# Patient Record
Sex: Male | Born: 1940 | Race: White | Hispanic: No | Marital: Married | Smoking: Former smoker
Health system: Southern US, Community
[De-identification: ages and names within clinical notes are randomized; demographics above are authoritative.]

## PROBLEM LIST (undated history)

## (undated) DIAGNOSIS — I2510 Atherosclerotic heart disease of native coronary artery without angina pectoris: Secondary | ICD-10-CM

## (undated) DIAGNOSIS — E785 Hyperlipidemia, unspecified: Secondary | ICD-10-CM

## (undated) DIAGNOSIS — L439 Lichen planus, unspecified: Secondary | ICD-10-CM

## (undated) DIAGNOSIS — I10 Essential (primary) hypertension: Secondary | ICD-10-CM

## (undated) DIAGNOSIS — N400 Enlarged prostate without lower urinary tract symptoms: Secondary | ICD-10-CM

## (undated) DIAGNOSIS — R7309 Other abnormal glucose: Secondary | ICD-10-CM

## (undated) HISTORY — PX: TONSILLECTOMY: SHX5217

## (undated) HISTORY — DX: Hyperlipidemia, unspecified: E78.5

## (undated) HISTORY — PX: VASECTOMY: SHX75

## (undated) HISTORY — DX: Essential (primary) hypertension: I10

## (undated) HISTORY — PX: APPENDECTOMY: SHX54

## (undated) HISTORY — DX: Lichen planus, unspecified: L43.9

## (undated) HISTORY — PX: CHOLECYSTECTOMY: SHX55

## (undated) HISTORY — DX: Benign prostatic hyperplasia without lower urinary tract symptoms: N40.0

## (undated) HISTORY — DX: Other abnormal glucose: R73.09

## (undated) HISTORY — DX: Atherosclerotic heart disease of native coronary artery without angina pectoris: I25.10

---
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
## 1997-10-15 HISTORY — PX: REFRACTIVE SURGERY: SHX103

## 1998-01-13 ENCOUNTER — Ambulatory Visit (HOSPITAL_COMMUNITY): Admission: RE | Admit: 1998-01-13 | Discharge: 1998-01-13 | Payer: Self-pay | Admitting: Neurosurgery

## 1998-10-15 HISTORY — PX: CORONARY ANGIOPLASTY WITH STENT PLACEMENT: SHX49

## 1999-06-11 ENCOUNTER — Inpatient Hospital Stay (HOSPITAL_COMMUNITY): Admission: EM | Admit: 1999-06-11 | Discharge: 1999-06-14 | Payer: Self-pay | Admitting: Emergency Medicine

## 1999-06-11 ENCOUNTER — Encounter: Payer: Self-pay | Admitting: Emergency Medicine

## 2000-04-26 ENCOUNTER — Ambulatory Visit (HOSPITAL_COMMUNITY): Admission: RE | Admit: 2000-04-26 | Discharge: 2000-04-27 | Payer: Self-pay | Admitting: *Deleted

## 2002-04-21 ENCOUNTER — Ambulatory Visit (HOSPITAL_COMMUNITY): Admission: RE | Admit: 2002-04-21 | Discharge: 2002-04-21 | Payer: Self-pay | Admitting: Gastroenterology

## 2002-12-24 ENCOUNTER — Encounter: Admission: RE | Admit: 2002-12-24 | Discharge: 2002-12-24 | Payer: Self-pay | Admitting: Internal Medicine

## 2002-12-24 ENCOUNTER — Encounter: Payer: Self-pay | Admitting: Internal Medicine

## 2004-05-12 ENCOUNTER — Inpatient Hospital Stay (HOSPITAL_COMMUNITY): Admission: EM | Admit: 2004-05-12 | Discharge: 2004-05-13 | Payer: Self-pay

## 2004-05-12 ENCOUNTER — Encounter (INDEPENDENT_AMBULATORY_CARE_PROVIDER_SITE_OTHER): Payer: Self-pay | Admitting: Specialist

## 2004-06-22 ENCOUNTER — Encounter: Admission: RE | Admit: 2004-06-22 | Discharge: 2004-06-22 | Payer: Self-pay | Admitting: Surgery

## 2004-08-17 ENCOUNTER — Ambulatory Visit (HOSPITAL_COMMUNITY): Admission: RE | Admit: 2004-08-17 | Discharge: 2004-08-17 | Payer: Self-pay | Admitting: Surgery

## 2004-08-17 ENCOUNTER — Encounter (INDEPENDENT_AMBULATORY_CARE_PROVIDER_SITE_OTHER): Payer: Self-pay | Admitting: *Deleted

## 2005-03-19 ENCOUNTER — Ambulatory Visit: Payer: Self-pay | Admitting: Internal Medicine

## 2005-04-10 ENCOUNTER — Ambulatory Visit: Payer: Self-pay | Admitting: Cardiology

## 2005-05-31 ENCOUNTER — Ambulatory Visit: Payer: Self-pay | Admitting: Internal Medicine

## 2005-06-04 ENCOUNTER — Ambulatory Visit: Payer: Self-pay

## 2005-09-04 ENCOUNTER — Inpatient Hospital Stay (HOSPITAL_COMMUNITY): Admission: RE | Admit: 2005-09-04 | Discharge: 2005-09-06 | Payer: Self-pay | Admitting: Orthopedic Surgery

## 2005-09-04 ENCOUNTER — Ambulatory Visit: Payer: Self-pay | Admitting: Internal Medicine

## 2005-09-10 ENCOUNTER — Ambulatory Visit: Payer: Self-pay | Admitting: Internal Medicine

## 2005-09-10 ENCOUNTER — Observation Stay (HOSPITAL_COMMUNITY): Admission: EM | Admit: 2005-09-10 | Discharge: 2005-09-11 | Payer: Self-pay | Admitting: Emergency Medicine

## 2005-09-13 ENCOUNTER — Ambulatory Visit: Payer: Self-pay | Admitting: Internal Medicine

## 2005-09-18 ENCOUNTER — Ambulatory Visit: Payer: Self-pay | Admitting: Internal Medicine

## 2005-09-21 ENCOUNTER — Ambulatory Visit: Payer: Self-pay | Admitting: Internal Medicine

## 2005-09-26 ENCOUNTER — Ambulatory Visit: Payer: Self-pay | Admitting: Internal Medicine

## 2006-05-01 ENCOUNTER — Ambulatory Visit: Payer: Self-pay | Admitting: Cardiology

## 2006-05-02 ENCOUNTER — Ambulatory Visit: Payer: Self-pay | Admitting: Cardiology

## 2006-05-02 ENCOUNTER — Ambulatory Visit: Payer: Self-pay | Admitting: Internal Medicine

## 2006-05-15 ENCOUNTER — Ambulatory Visit: Payer: Self-pay | Admitting: Cardiology

## 2006-08-07 ENCOUNTER — Ambulatory Visit: Payer: Self-pay

## 2006-12-17 ENCOUNTER — Ambulatory Visit: Payer: Self-pay | Admitting: Internal Medicine

## 2006-12-17 LAB — CONVERTED CEMR LAB
Hgb A1c MFr Bld: 6.2 % — ABNORMAL HIGH (ref 4.6–6.0)
Total CK: 109 units/L (ref 7–195)

## 2007-07-07 ENCOUNTER — Telehealth: Payer: Self-pay | Admitting: Internal Medicine

## 2007-07-09 ENCOUNTER — Encounter: Payer: Self-pay | Admitting: Internal Medicine

## 2007-07-22 ENCOUNTER — Ambulatory Visit: Payer: Self-pay | Admitting: Internal Medicine

## 2007-07-27 LAB — CONVERTED CEMR LAB
Hgb A1c MFr Bld: 5.8 % (ref 4.6–6.0)
LDL Cholesterol: 50 mg/dL (ref 0–99)
Triglycerides: 94 mg/dL (ref 0–149)
VLDL: 19 mg/dL (ref 0–40)

## 2007-07-28 ENCOUNTER — Encounter (INDEPENDENT_AMBULATORY_CARE_PROVIDER_SITE_OTHER): Payer: Self-pay | Admitting: *Deleted

## 2007-07-28 ENCOUNTER — Ambulatory Visit: Payer: Self-pay | Admitting: Internal Medicine

## 2007-07-28 DIAGNOSIS — E785 Hyperlipidemia, unspecified: Secondary | ICD-10-CM | POA: Insufficient documentation

## 2007-07-28 DIAGNOSIS — E1169 Type 2 diabetes mellitus with other specified complication: Secondary | ICD-10-CM | POA: Insufficient documentation

## 2007-07-28 DIAGNOSIS — E782 Mixed hyperlipidemia: Secondary | ICD-10-CM | POA: Insufficient documentation

## 2007-07-28 DIAGNOSIS — N400 Enlarged prostate without lower urinary tract symptoms: Secondary | ICD-10-CM | POA: Insufficient documentation

## 2007-07-28 LAB — CONVERTED CEMR LAB
HDL goal, serum: 40 mg/dL
LDL Goal: 130 mg/dL

## 2007-07-29 LAB — CONVERTED CEMR LAB: PSA: 0.72 ng/mL (ref 0.10–4.00)

## 2007-07-30 ENCOUNTER — Encounter (INDEPENDENT_AMBULATORY_CARE_PROVIDER_SITE_OTHER): Payer: Self-pay | Admitting: *Deleted

## 2007-08-08 ENCOUNTER — Ambulatory Visit: Payer: Self-pay | Admitting: Internal Medicine

## 2007-10-01 ENCOUNTER — Ambulatory Visit: Payer: Self-pay | Admitting: Cardiology

## 2007-10-16 HISTORY — PX: CATARACT EXTRACTION W/ INTRAOCULAR LENS IMPLANT: SHX1309

## 2008-02-23 ENCOUNTER — Ambulatory Visit: Payer: Self-pay | Admitting: Internal Medicine

## 2008-02-23 ENCOUNTER — Telehealth (INDEPENDENT_AMBULATORY_CARE_PROVIDER_SITE_OTHER): Payer: Self-pay | Admitting: *Deleted

## 2008-03-18 ENCOUNTER — Telehealth (INDEPENDENT_AMBULATORY_CARE_PROVIDER_SITE_OTHER): Payer: Self-pay | Admitting: *Deleted

## 2008-04-15 ENCOUNTER — Ambulatory Visit: Payer: Self-pay | Admitting: Cardiology

## 2008-04-19 ENCOUNTER — Encounter: Payer: Self-pay | Admitting: Internal Medicine

## 2008-04-27 ENCOUNTER — Encounter: Payer: Self-pay | Admitting: Internal Medicine

## 2008-04-28 ENCOUNTER — Ambulatory Visit: Payer: Self-pay

## 2008-05-06 ENCOUNTER — Ambulatory Visit (HOSPITAL_COMMUNITY): Admission: RE | Admit: 2008-05-06 | Discharge: 2008-05-07 | Payer: Self-pay | Admitting: Orthopedic Surgery

## 2008-10-06 ENCOUNTER — Ambulatory Visit: Payer: Self-pay | Admitting: Cardiology

## 2008-10-06 LAB — CONVERTED CEMR LAB
HDL: 40.7 mg/dL (ref >39.0)
LDL Cholesterol: 91 mg/dL (ref 0–99)
Triglycerides: 85 mg/dL (ref 0–149)
VLDL: 17 mg/dL (ref 0–40)

## 2008-11-30 ENCOUNTER — Ambulatory Visit: Payer: Self-pay | Admitting: Cardiology

## 2008-11-30 LAB — CONVERTED CEMR LAB
Albumin: 4 g/dL (ref 3.5–5.2)
Alkaline Phosphatase: 51 units/L (ref 39–117)
Cholesterol: 133 mg/dL (ref 0–200)
HDL: 39.3 mg/dL (ref >39.0)
LDL Cholesterol: 81 mg/dL (ref 0–99)
Total Protein: 6.4 g/dL (ref 6.0–8.3)
Triglycerides: 66 mg/dL (ref 0–149)

## 2009-05-31 ENCOUNTER — Ambulatory Visit: Payer: Self-pay | Admitting: Cardiology

## 2009-06-06 ENCOUNTER — Ambulatory Visit: Payer: Self-pay | Admitting: Cardiology

## 2009-06-06 DIAGNOSIS — I2510 Atherosclerotic heart disease of native coronary artery without angina pectoris: Secondary | ICD-10-CM | POA: Insufficient documentation

## 2009-06-21 ENCOUNTER — Encounter (INDEPENDENT_AMBULATORY_CARE_PROVIDER_SITE_OTHER): Payer: Self-pay | Admitting: *Deleted

## 2009-06-21 ENCOUNTER — Telehealth (INDEPENDENT_AMBULATORY_CARE_PROVIDER_SITE_OTHER): Payer: Self-pay | Admitting: *Deleted

## 2009-07-05 ENCOUNTER — Telehealth: Payer: Self-pay | Admitting: Cardiology

## 2009-07-06 LAB — CONVERTED CEMR LAB
AST: 25 units/L (ref 0–37)
Albumin: 4.3 g/dL (ref 3.5–5.2)
Basophils Absolute: 0 10*3/uL (ref 0.0–0.1)
Bilirubin, Direct: 0.1 mg/dL (ref 0.0–0.3)
CO2: 29 meq/L (ref 19–32)
Calcium: 9.2 mg/dL (ref 8.4–10.5)
Cholesterol: 126 mg/dL (ref 0–200)
Creatinine, Ser: 1.2 mg/dL (ref 0.4–1.5)
Eosinophils Absolute: 0.2 10*3/uL (ref 0.0–0.7)
GFR calc non Af Amer: 64.43 mL/min (ref >60)
Glucose, Bld: 114 mg/dL — ABNORMAL HIGH (ref 70–99)
HCT: 47.2 % (ref 39.0–52.0)
HDL: 39.4 mg/dL (ref >39.00)
Lymphocytes Relative: 19.2 % (ref 12.0–46.0)
Monocytes Absolute: 0.7 10*3/uL (ref 0.1–1.0)
Platelets: 163 10*3/uL (ref 150.0–400.0)
Potassium: 4.9 meq/L (ref 3.5–5.1)
RDW: 12.8 % (ref 11.5–14.6)
TSH: 1.67 microintl units/mL (ref 0.35–5.50)
Total Bilirubin: 1.3 mg/dL — ABNORMAL HIGH (ref 0.3–1.2)
Total CHOL/HDL Ratio: 3
Triglycerides: 71 mg/dL (ref 0.0–149.0)
VLDL: 14.2 mg/dL (ref 0.0–40.0)
WBC: 6.9 10*3/uL (ref 4.5–10.5)

## 2009-07-25 ENCOUNTER — Ambulatory Visit: Payer: Self-pay | Admitting: Internal Medicine

## 2009-07-25 DIAGNOSIS — R7309 Other abnormal glucose: Secondary | ICD-10-CM | POA: Insufficient documentation

## 2009-07-28 ENCOUNTER — Encounter (INDEPENDENT_AMBULATORY_CARE_PROVIDER_SITE_OTHER): Payer: Self-pay | Admitting: *Deleted

## 2010-02-17 ENCOUNTER — Ambulatory Visit: Payer: Self-pay | Admitting: Internal Medicine

## 2010-02-17 DIAGNOSIS — J302 Other seasonal allergic rhinitis: Secondary | ICD-10-CM | POA: Insufficient documentation

## 2010-02-27 ENCOUNTER — Telehealth (INDEPENDENT_AMBULATORY_CARE_PROVIDER_SITE_OTHER): Payer: Self-pay | Admitting: *Deleted

## 2010-07-05 ENCOUNTER — Ambulatory Visit: Payer: Self-pay | Admitting: Cardiology

## 2010-07-24 ENCOUNTER — Telehealth: Payer: Self-pay | Admitting: Internal Medicine

## 2010-07-25 ENCOUNTER — Encounter: Payer: Self-pay | Admitting: Internal Medicine

## 2010-07-26 ENCOUNTER — Ambulatory Visit: Payer: Self-pay | Admitting: Internal Medicine

## 2010-07-26 DIAGNOSIS — K121 Other forms of stomatitis: Secondary | ICD-10-CM | POA: Insufficient documentation

## 2010-07-26 DIAGNOSIS — K1230 Oral mucositis (ulcerative), unspecified: Secondary | ICD-10-CM

## 2010-07-27 ENCOUNTER — Encounter: Payer: Self-pay | Admitting: Internal Medicine

## 2010-07-28 ENCOUNTER — Telehealth (INDEPENDENT_AMBULATORY_CARE_PROVIDER_SITE_OTHER): Payer: Self-pay | Admitting: *Deleted

## 2010-07-28 LAB — CONVERTED CEMR LAB
ALT: 37 units/L (ref 0–53)
AST: 33 units/L (ref 0–37)
Albumin: 4.2 g/dL (ref 3.5–5.2)
Alkaline Phosphatase: 55 units/L (ref 39–117)
BUN: 21 mg/dL (ref 6–23)
Basophils Relative: 0.3 % (ref 0.0–3.0)
Bilirubin, Direct: 0.2 mg/dL (ref 0.0–0.3)
Chloride: 102 meq/L (ref 96–112)
Eosinophils Absolute: 0.1 10*3/uL (ref 0.0–0.7)
Eosinophils Relative: 1.4 % (ref 0.0–5.0)
Lymphocytes Relative: 20.9 % (ref 12.0–46.0)
MCHC: 34.6 g/dL (ref 30.0–36.0)
MCV: 97 fL (ref 78.0–100.0)
Monocytes Absolute: 0.1 10*3/uL (ref 0.1–1.0)
Monocytes Relative: 1.3 % — ABNORMAL LOW (ref 3.0–12.0)
Neutrophils Relative %: 76.1 % (ref 43.0–77.0)
Platelets: 172 10*3/uL (ref 150.0–400.0)
RDW: 12.9 % (ref 11.5–14.6)
Sed Rate: 4 mm/hr (ref 0–22)
Sodium: 139 meq/L (ref 135–145)
Total Bilirubin: 0.9 mg/dL (ref 0.3–1.2)
WBC: 7.2 10*3/uL (ref 4.5–10.5)

## 2010-07-31 ENCOUNTER — Encounter: Payer: Self-pay | Admitting: Internal Medicine

## 2010-08-07 ENCOUNTER — Encounter: Payer: Self-pay | Admitting: Internal Medicine

## 2010-08-29 ENCOUNTER — Telehealth (INDEPENDENT_AMBULATORY_CARE_PROVIDER_SITE_OTHER): Payer: Self-pay | Admitting: Radiology

## 2010-08-30 ENCOUNTER — Ambulatory Visit: Payer: Self-pay | Admitting: Cardiovascular Disease

## 2010-08-30 ENCOUNTER — Ambulatory Visit: Payer: Self-pay

## 2010-08-30 ENCOUNTER — Ambulatory Visit (HOSPITAL_COMMUNITY): Admission: RE | Admit: 2010-08-30 | Discharge: 2010-08-30 | Payer: Self-pay | Admitting: Cardiology

## 2010-09-13 ENCOUNTER — Ambulatory Visit: Payer: Self-pay | Admitting: Internal Medicine

## 2010-09-13 DIAGNOSIS — I10 Essential (primary) hypertension: Secondary | ICD-10-CM | POA: Insufficient documentation

## 2010-09-13 DIAGNOSIS — L439 Lichen planus, unspecified: Secondary | ICD-10-CM | POA: Insufficient documentation

## 2010-09-13 DIAGNOSIS — E1159 Type 2 diabetes mellitus with other circulatory complications: Secondary | ICD-10-CM | POA: Insufficient documentation

## 2010-09-14 LAB — CONVERTED CEMR LAB
Cholesterol: 139 mg/dL (ref 0–200)
Hgb A1c MFr Bld: 6.2 % (ref 4.6–6.5)
LDL Cholesterol: 87 mg/dL (ref 0–99)
PSA: 1.29 ng/mL (ref 0.10–4.00)
TSH: 1.34 microintl units/mL (ref 0.35–5.50)
VLDL: 11.4 mg/dL (ref 0.0–40.0)

## 2010-10-19 ENCOUNTER — Ambulatory Visit
Admission: RE | Admit: 2010-10-19 | Discharge: 2010-10-19 | Payer: Self-pay | Source: Home / Self Care | Attending: Internal Medicine | Admitting: Internal Medicine

## 2010-11-16 NOTE — Letter (Signed)
Summary: E Mail from Patient with Concerns  E Mail from Patient with Concerns   Imported By: Lanelle Bal 08/05/2010 09:02:08  _____________________________________________________________________  External Attachment:    Type:   Image     Comment:   External Document

## 2010-11-16 NOTE — Progress Notes (Signed)
Summary: 10-10-mouth infection- pt has appt [id number]  Phone Note Call from Patient   Caller: Patient Summary of Call: pt left VM that he has an infection in his mouth and would like to get dr hopper opinion. Left message to call office to get further details.............Marland KitchenFelecia Deloach CMA  July 24, 2010 12:19 PM   Follow-up for Phone Call        call from pt and he would like Dr.Hopper to give him a call back when he was available. Pt's call back # [phone] this is the pt's call phone number.  Follow-up by: Almeta Monas CMA Duncan Dull),  July 24, 2010 12:29 PM  Additional Follow-up for Phone Call Additional follow up Details #1::        I left message for him to come in @ 4:30 today if he desires. W/O an exam I would not be able to discern etiology of process Additional Follow-up by: Marga Melnick MD,  July 24, 2010 1:05 PM    Additional Follow-up for Phone Call Additional follow up Details #2::    patient unable to come in today appt scheduled [id number] .Marland KitchenOkey Regal [place]  July 24, 2010 2:09 PM

## 2010-11-16 NOTE — Letter (Signed)
Summary: Gwendlyn Deutscher [name]  Gwendlyn Deutscher [name]   Imported By: Lanelle Bal 09/04/2010 10:05:40  _____________________________________________________________________  External Attachment:    Type:   Image     Comment:   External Document

## 2010-11-16 NOTE — Assessment & Plan Note (Signed)
Summary: cpx & lab/cbs   Vital Signs:  Patient profile:   70 year old male Height:      71.5 inches Weight:      256.8 pounds BMI:     35.44 Temp:     98.1 degrees F oral Pulse rate:   60 / minute Resp:     14 per minute BP sitting:   155 / 75  (left arm) Cuff size:   large  Vitals Entered By: Shonna Chock CMA (September 13, 2010 8:29 AM) CC: CPX with fasting labs    Primary Care Provider:  Marga Melnick MD  CC:  CPX with fasting labs .  History of Present Illness: Here for Medicare AWV: 1.Risk factors based on Past M, S, F history:CAD; Dyslipidemia ; HTN ( chart updated) 2.Physical Activities: weights, cardio, machines 3-4X /weekX 60 min 3.Depression/mood:no issues  4.Hearing: decreased hearing on R to whisper @ 6 ft; hearing protection worn when shooting. PMH of myringotomies on R 5.ADL's: no limitations 6.Fall Risk:none   7.Home Safety: no issues 8.Height, weight, &visual acuity:slightly decreased reading @  6 ft OD ; Dr Hazle Quant monitoring cataract 9.Counseling:  POA & Living Will in place 10.Labs ordered based on risk factors: see Orders 11. Referral Coordination: Audiology referral discussed 82. Care Plan:see Instructions 13.  Cognitive Assessment: Orientation X3; memory & recall  intact   ; "WORLD" spelled backwards; mood & affect  normal. Hypertension Follow-Up      The patient denies lightheadedness, urinary frequency, headaches, edema, and fatigue.  The patient denies the following associated symptoms: chest pain, chest pressure, exercise intolerance, dyspnea, palpitations, and syncope.  Compliance with medications (by patient report) has been near 100%.  The patient reports that dietary compliance has been excellent.  The patient reports exercising 3-4X per week.  Stress ECHO negative this month. Adjunctive measures currently used by the patient include modified salt restriction.  BP not checked @ home. Repeat BP 120/75. Hyperlipidemia Follow-Up        The patient  denies muscle aches, GI upset, abdominal pain, flushing, itching, constipation, and diarrhea.  Compliance with medications (by patient report) has been near 100%.  Adjunctive measures currently used by the patient include ASA and  intermittent Co-Q 10.  Preventive Screening-Counseling & Management  Alcohol-Tobacco     Alcohol drinks/day: 1     Smoking Status: quit > 6 months     Year Started: 1962     Year Quit: 1999     Cigars/week: 2-3  Caffeine-Diet-Exercise     Caffeine use/day: 24 oz  Hep-HIV-STD-Contraception     Dental Visit-last 6 months yes     Sun Exposure-Excessive: no  Safety-Violence-Falls     Risk analyst Use: yes     Firearms in the Home: firearms in the home     Firearm Counseling: not indicated; uses recommended firearm safety measures     Smoke Detectors: yes      Blood Transfusions:  no.        Travel History:  [place] 2008.    Current Medications (verified): 1)  Altace 10 Mg  Tabs (Ramipril) .Marland Kitchen.. 1 By Mouth Qd 2)  Crestor 40 Mg Tabs (Rosuvastatin Calcium) .... Take One Tablet By Mouth Daily. 3)  Multivitamins   Tabs (Multiple Vitamin) .Marland Kitchen.. 1 Tab Once Daily 4)  Aspirin 81 Mg Tbec (Aspirin) .... Take One Tablet By Mouth Daily 5)  Metoprolol Succinate 50 Mg Xr24h-Tab (Metoprolol Succinate) .... 1/2 Once Daily 6)  Ibuprofen 200 Mg Tabs (Ibuprofen) .Marland KitchenMarland KitchenMarland Kitchen  3 By Mouth Every Am 7)  Coenzyme Q10 200 Mg Caps (Coenzyme Q10) .Marland Kitchen.. 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 etiology  Social History: Retired Education officer, community  Married Former Smoker: quit 1999 Alcohol use-yes: socially Regular exercise-yes: aerobic as treadmill, recumbent bike X 1 hr 3-4x/ week Smoking Status:  quit > 6 months Caffeine use/day:  24 oz Dental Care w/in 6 mos.:  yes Sun Exposure-Excessive:  no Seat Belt Use:  yes Blood Transfusions:  no  Review of Systems  The patient denies anorexia, fever, weight loss, weight gain, hoarseness, prolonged cough, hemoptysis, melena, hematochezia, severe indigestion/heartburn, hematuria, suspicious skin lesions, unusual weight change, abnormal bleeding, enlarged lymph nodes, and angioedema.         Dr Mayford Knife is treating oral Lichen Planus  Physical Exam  General:  well-nourished;alert,appropriate and cooperative throughout examination Head:  Normocephalic and atraumatic without obvious abnormalities. Eyes:  No corneal or conjunctival inflammation noted. Ptosis OS. Perrla. Funduscopic exam benign, without hemorrhages, exudates or papilledema. Decreased light reflex OD Ears:  External ear exam shows no significant lesions or deformities.  Otoscopic examination reveals clear canals, tympanic membranes are intact bilaterally without bulging, retraction, inflammation or discharge. Hearing is grossly normal bilaterally. Minor scarring Nose:  External nasal examination shows no deformity or inflammation. Nasal mucosa are pink and moist without lesions or exudates. Mouth:  Oral mucosa and oropharynx : mild lichen planus  .  Teeth in good repair. Neck:  No deformities, masses, or tenderness noted. Lungs:  Normal respiratory effort, chest expands symmetrically. Lungs are clear to auscultation, no crackles or wheezes. Heart:  regular rhythm, no murmur, no gallop, no rub, no JVD, no HJR, and bradycardia.   Abdomen:  Bowel sounds positive,abdomen soft and non-tender without masses,  organomegaly or hernias noted. Rectal:  No external abnormalities noted. Normal sphincter tone. No rectal masses or tenderness. Genitalia:  Testes bilaterally descended without nodularity, tenderness or masses. No scrotal masses or lesions. No penis lesions or urethral discharge. Vasectomy scar tissue Prostate:  Prostate gland firm and smooth, no enlargement, nodularity, tenderness, mass, asymmetry or induration. Msk:  No deformity or scoliosis noted of thoracic or lumbar spine.   Pulses:  R and L carotid,radial,dorsalis pedis and posterior tibial pulses are full and equal bilaterally Extremities:  No clubbing, cyanosis, edema. Crepitus of knees with decreased ROM Neurologic:  alert & oriented X3 and DTRs symmetrical and normal.   Skin:  Intact without suspicious lesions or rashes. Congenital nevus R elbow area Cervical Nodes:  No lymphadenopathy noted Axillary Nodes:  No palpable lymphadenopathy Inguinal Nodes:  No significant adenopathy Psych:  memory intact for recent and remote, normally interactive, and good eye contact.     Impression & Recommendations:  Problem # 1:  PREVENTIVE HEALTH CARE (ICD-V70.0)  Orders: Medicare -1st Annual Wellness Visit [phone])  Problem # 2:  HYPERTENSION (ICD-401.9) repeat BP 120/75 His updated medication list for this problem includes:    Altace 10 Mg Tabs (Ramipril) .Marland KitchenMarland KitchenMarland KitchenMarland Kitchen 1  by mouth qd    Metoprolol Succinate 50 Mg Xr24h-tab (Metoprolol succinate) .Marland Kitchen... 1/2 once daily  Problem # 3:  HYPERLIPIDEMIA (ICD-272.2)  His updated medication list for this problem includes:    Crestor 40 Mg Tabs (Rosuvastatin calcium) .Marland Kitchen... Take one tablet by mouth daily.  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 1 by mouth qd    Aspirin 81 Mg Tbec (Aspirin) .Marland Kitchen... Take one tablet by mouth daily    Metoprolol Succinate 50 Mg Xr24h-tab (Metoprolol succinate) .Marland Kitchen... 1/2 once daily  Problem # 7:  FASTING HYPERGLYCEMIA (ICD-790.29)  Orders: Venipuncture (84696) Specimen Handling (29528) TLB-A1C / Hgb A1C (Glycohemoglobin) (83036-A1C)  Complete Medication List: 1)  Altace 10 Mg Tabs (Ramipril) .Marland Kitchen.. 1 by mouth qd 2)  Crestor 40 Mg Tabs (Rosuvastatin calcium) .... Take one tablet by mouth daily. 3)  Multivitamins Tabs (Multiple vitamin) .Marland Kitchen.. 1 tab once daily 4)  Aspirin 81 Mg Tbec (Aspirin) .... Take one tablet by mouth daily 5)  Metoprolol Succinate 50 Mg Xr24h-tab (Metoprolol succinate) .... 1/2 once daily 6)  Ibuprofen 200 Mg Tabs (Ibuprofen) .... 3 by mouth every am 7)  Coenzyme Q10 200 Mg Caps (Coenzyme q10) .Marland Kitchen.. 1 cap once daily  Other Orders: Flu Vaccine 46yrs + MEDICARE PATIENTS (U1324) Administration Flu vaccine - MCR (M0102)  Patient Instructions: 1)  ConsiderAudiology referral. 2)  It is important that you exercise regularly at least 20 minutes 5 times a week. If you develop chest pain, have severe difficulty breathing, or feel very tired , stop exercising immediately and seek medical attention. 3)  Schedule a colonoscopy as per Dr Kinnie Scales. 4)  Take an Aspirin every day with food. 5)  Check your Blood Pressure regularly. If it is above:135/85 ON AVERAGE  you should make an appointment.   Orders Added: 1)  Medicare -1st Annual Wellness Visit [G0438] 2)  Est. Patient Level III [72536] 3)  Venipuncture [64403] 4)  Specimen Handling [99000] 5)  Flu Vaccine 70yrs + MEDICARE PATIENTS [Q2039] 6)  Administration  Flu vaccine - MCR [G0008] 7)  TLB-TSH (Thyroid Stimulating Hormone) [84443-TSH] 8)  TLB-Lipid Panel [80061-LIPID] 9)  TLB-A1C / Hgb A1C (Glycohemoglobin) [83036-A1C] 10)  TLB-PSA (Prostate Specific Antigen) [47425-ZDG]      Flu Vaccine Consent Questions     Do you have a history of severe allergic reactions to this vaccine? no    Any prior history of allergic reactions to egg and/or gelatin? no    Do you have a sensitivity to the preservative Thimersol? no    Do you have a past history of Guillan-Barre Syndrome? no    Do you currently have an acute febrile illness? no    Have you ever had a severe reaction to latex? no    Vaccine information given and explained to patient? yes    Are you currently pregnant? no    Lot Number:[id number]   Exp Date:04/14/2011   Site Given  Right Deltoid IMbmedflu

## 2010-11-16 NOTE — Miscellaneous (Signed)
Summary: Appointment Canceled  Appointment status changed to canceled by [employer] on 08/07/2010 11:34 AM.  Cancellation Comments --------------------- STRESS ECHO/414.01/SEC. HORZ/NO PREC. REA/SAF  Appointment Information ----------------------- Appt Type:  CARDIOLOGY ANCILLARY VISIT      Date:  Wednesday, August 23, 2010      Time:  8:30 AM for 60 min   Urgency:  Routine   Made By:  Hoy Finlay Scheduler  To Visit:  [id number]    Reason:  STRESS ECHO/414.01/SEC. HORZ/NO PREC. REA/SAF  Appt Comments ------------- -- 08/07/10 11:34: (CEMR) CANCELED -- STRESS ECHO/414.01/SEC. HORZ/NO PREC. REA/SAF -- 07/06/10 11:22: (CEMR) BOOKED -- Routine CARDIOLOGY ANCILLARY VISIT at 08/23/2010 8:30 AM for 60 min STRESS ECHO/414.01/SEC. HORZ/NO PREC. REA/SAF -- 07/06/10 11:20:

## 2010-11-16 NOTE — Progress Notes (Signed)
Summary: stress echo pre-preocedure  Phone Note Outgoing Call   Call placed by: Domenic Polite, CNMT,  August 29, 2010 12:09 PM Call placed to: Patient Reason for Call: Confirm/change Appt Summary of Call: Tried to reach patient,answering machine was not accepting anymore calls. Initial call taken by: Domenic Polite, CNMT,  August 29, 2010 12:09 PM

## 2010-11-16 NOTE — Progress Notes (Signed)
Summary: Lab Results  Phone Note Outgoing Call Call back at Home Phone [phone]   Call placed by: Shonna Chock CMA,  July 28, 2010 7:57 AM Call placed to: Patient Summary of Call: Spoke with patient: Complete blood count  is normal(monocyte count of 1.3 % insignificant). Sed rate measures inflammation. Glucose is high but this was non fasting; the Diabetes screening test (A1c) will be checked just to be cautious. Other chemistry tests & liver function are normal. Overall results are excellent.Levester Fresh CMA  July 28, 2010 7:57 AM

## 2010-11-16 NOTE — Assessment & Plan Note (Signed)
Summary: infection in mouth/cbs   Vital Signs:  Patient profile:   70 year old male Weight:      254.2 pounds BMI:     34.89 Temp:     98.5 degrees F oral Pulse rate:   64 / minute Resp:     14 per minute BP sitting:   140 / 78  (left arm) Cuff size:   large  Vitals Entered By: Shonna Chock CMA (July 26, 2010 2:03 PM) CC: Infection, URI symptoms, Rash   Primary Care Provider:  Marga Melnick MD  CC:  Infection, URI symptoms, and Rash.  History of Present Illness: Dr Julious Oka  presents with oral lesions present  for > 1 month.  The patient reports  some sore throat, but denies nasal congestion, purulent nasal discharge, productive cough,  earache, fever, headache, bilateral facial pain, tooth pain, and tender adenopathy.  Dr Dereck Leep Rxed 10 days of Diflucan & Clotrimazole lozenges , but there has been no change in the oral lesions.Mucosal biopsy is pending @ [facility]. The lesions are worse with heat or spicey foods.  The patient denies the following symptoms:  facial  or  tongue swelling, difficulty breathing, abdominal pain, nausea, vomiting, diarrhea, dysuria, eye symptoms, and arthralgias.  The patient denies history of recent infection, recent antibiotic use,  or new medication other than the anti fungal agents Rxed by Dr Manson Passey.    Current Medications (verified): 1)  Altace 10 Mg  Tabs (Ramipril) .Marland Kitchen.. 1 By Mouth Qd 2)  Crestor 40 Mg Tabs (Rosuvastatin Calcium) .... Take One Tablet By Mouth Daily. 3)  Folic Acid 1 Mg Tabs (Folic Acid) .Marland Kitchen.. 1 Tab Once Daily 4)  Multivitamins   Tabs (Multiple Vitamin) .Marland Kitchen.. 1 Tab Once Daily 5)  Aspirin 81 Mg Tbec (Aspirin) .... Take One Tablet By Mouth Daily 6)  Metoprolol Succinate 50 Mg Xr24h-Tab (Metoprolol Succinate) .... 1/2 Once Daily 7)  Ibuprofen 200 Mg Tabs (Ibuprofen) .... 3 By Mouth Every Am 8)  Coenzyme Q10 200 Mg Caps (Coenzyme Q10) .Marland Kitchen.. 1 Cap Once Daily  Allergies (verified): No Known Drug Allergies  Review of  Systems General:  Denies chills, fever, and weight loss. Eyes:  Denies discharge, eye pain, and red eye. Allergy:  Denies hives or rash, itching eyes, and sneezing.  Physical Exam  General:  well-nourished,in no acute distress; alert,appropriate and cooperative throughout examination Eyes:  No corneal or conjunctival inflammation noted. EOMI. Perrla. Funduscopic exam benign, without hemorrhages, exudates or papilledema. Vision grossly normal. Ears:  External ear exam shows no significant lesions or deformities.  Otoscopic examination reveals clear canals, tympanic membranes are intact bilaterally without bulging, retraction, inflammation or discharge. Hearing is grossly normal bilaterally. Nose:  External nasal examination shows no deformity or inflammation. Nasal mucosa are pink and moist without lesions or exudates. Mouth:  Oral mucosa :diffuse punctate  faint erythematous  lesions  with scattered coalescing patches  w/o  exudates. No frank blisters or ulcers. Teeth in good repair. Neck:  No deformities, masses, or tenderness noted.Supple Lungs:  Normal respiratory effort, chest expands symmetrically. Lungs are clear to auscultation, no crackles or wheezes. Heart:  Normal rate and regular rhythm. S1 and S2 normal without gallop, murmur, click, rub or other extra sounds. Abdomen:  Bowel sounds positive,abdomen soft and non-tender without masses, organomegaly or hernias noted. Skin:  Intact without suspicious lesions or rashes Cervical Nodes:  No lymphadenopathy noted Axillary Nodes:  No palpable lymphadenopathy Psych:  Oriented X3 and normally interactive.  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 1 by mouth qd    Aspirin 81 Mg Tbec (Aspirin) .Marland Kitchen... Take one tablet by mouth daily    Metoprolol Succinate 50 Mg Xr24h-tab (Metoprolol succinate) .Marland Kitchen... 1/2 once daily  Problem # 3:  HYPERLIPIDEMIA (ICD-272.2)  His updated medication list for this problem includes:    Crestor 40 Mg Tabs (Rosuvastatin calcium) .Marland Kitchen... Take one tablet by mouth daily.  Complete Medication List: 1)  Altace 10 Mg Tabs (Ramipril) .Marland Kitchen.. 1 by mouth qd 2)  Crestor 40 Mg Tabs (Rosuvastatin calcium) .... Take one tablet by mouth daily. 3)  Folic Acid 1 Mg Tabs (Folic acid) .Marland Kitchen.. 1 tab once daily 4)  Multivitamins Tabs (Multiple vitamin) .Marland Kitchen.. 1 tab once daily 5)  Aspirin 81 Mg Tbec (Aspirin) .... Take one tablet by mouth daily 6)  Metoprolol Succinate 50 Mg Xr24h-tab (Metoprolol succinate) .... 1/2 once daily 7)  Ibuprofen 200 Mg Tabs (Ibuprofen) .... 3 by mouth every am 8)  Coenzyme Q10 200 Mg Caps (Coenzyme q10) .Marland Kitchen.. 1 cap once daily 9)  Doxycycline Hyclate 100 Mg Caps (Doxycycline hyclate) .... Dissolve in 5 cc water ; swish well  & swallow(avoid sun)  Patient Instructions: 1)  Stop Altace (Ramipril); increase Metoprolol to 1 once daily . 2)  Check your Blood Pressure regularly.Your goal = AVERAGE < 135/85.Stop all supplements . Prescriptions: DOXYCYCLINE HYCLATE 100 MG CAPS (DOXYCYCLINE HYCLATE) dissolve in 5 cc water ; swish well  & swallow(avoid sun)  #20 x 0   Entered and Authorized by:   Marga Melnick MD   Signed by:   Marga Melnick MD on 07/26/2010   Method used:   Faxed to ...       [facility] [phone]* (retail)       [address]       [address]       [place], [place]  [address]       Ph: [phone]       Fax: [phone]   RxID:   [id number]

## 2010-11-16 NOTE — Letter (Signed)
Summary: Cancer Screening/[facility] Personalized Risk Profile  Cancer Screening/[facility] Personalized Risk Profile   Imported By: Lanelle Bal 09/18/2010 14:25:01  _____________________________________________________________________  External Attachment:    Type:   Image     Comment:   External Document

## 2010-11-16 NOTE — Assessment & Plan Note (Signed)
Summary: f1y  Medications Added FOLIC ACID 1 MG TABS (FOLIC ACID) 1 tab once daily MULTIVITAMINS   TABS (MULTIPLE VITAMIN) 1 tab once daily COENZYME Q10 200 MG CAPS (COENZYME Q10) 1 cap once daily      Allergies Added: NKDA  Visit Type:  1 yr f/u Primary Provider:  Marga Melnick MD  CC:  no cardiac complaints today.  History of Present Illness: [name] is 70 years old and return for management of CAD. 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 He has done well since that time has had no recent chest pain or palpitations. He does have shortness of breath with exertion which has not changed much over the past year.  Current Medications (verified): 1)  Altace 10 Mg  Tabs (Ramipril) .Marland Kitchen.. 1 By Mouth Qd 2)  Crestor 40 Mg Tabs (Rosuvastatin Calcium) .... Take One Tablet By Mouth Daily. 3)  Folic Acid 1 Mg Tabs (Folic Acid) .Marland Kitchen.. 1 Tab Once Daily 4)  Multivitamins   Tabs (Multiple Vitamin) .Marland Kitchen.. 1 Tab Once Daily 5)  Aspirin 81 Mg Tbec (Aspirin) .... Take One Tablet By Mouth Daily 6)  Metoprolol Succinate 50 Mg Xr24h-Tab (Metoprolol Succinate) .... 1/2 Once Daily 7)  Ibuprofen 200 Mg Tabs (Ibuprofen) .... 3 By Mouth Every Am 8)  Promethazine Vc/codeine 6.25-5-10 Mg/30ml Syrp (Phenyleph-Promethazine-Cod) .Marland Kitchen.. 1 Tsp Q 6 Hrs As Needed 9)  Coenzyme Q10 200 Mg Caps (Coenzyme Q10) .Marland Kitchen.. 1 Cap Once Daily  Allergies (verified): No Known Drug Allergies  Past History:  Past Medical History: Reviewed history from 07/25/2009 and no changes required. 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   Vital Signs:  Patient profile:   70 year old male Height:      71.7 inches Weight:      257.12 pounds BMI:     35.29 Pulse rate:   71 / minute Pulse  rhythm:   irregular BP sitting:   122 / 70  (left arm) Cuff size:   large  Vitals Entered By: [name], CMA (July 05, 2010 2:13 PM)  Physical Exam  Additional Exam:  Gen. Well-nourished, in no distress   Neck: No JVD, thyroid not enlarged, no carotid bruits Lungs: No tachypnea, clear without rales, rhonchi or wheezes Cardiovascular: Rhythm regular, PMI not displaced,  heart sounds  normal, no murmurs or gallops, no peripheral edema, pulses normal in all 4 extremities. Abdomen: BS normal, abdomen soft and non-tender without masses or organomegaly, no hepatosplenomegaly. MS: No deformities, no cyanosis or clubbing   Neuro:  No focal sns   Skin:  no lesions    Impression & Recommendations:  Problem # 1:  CORONARY ATHEROSCLEROSIS NATIVE CORONARY ARTERY (ICD-414.01)  He had an anterior MI in 2003 with a bare-metal stent to the RCA. He does have some dyspnea on exertion and he has not had evaluation of his LV function in some time so will plan further evaluation with any exercise stress echocardiogram. His updated medication list for this problem includes:    Altace 10 Mg Tabs (Ramipril) .Marland Kitchen... 1 by mouth qd    Aspirin 81 Mg Tbec (Aspirin) .Marland Kitchen... Take one tablet by mouth daily    Metoprolol Succinate 50 Mg Xr24h-tab (Metoprolol succinate) .Marland Kitchen... 1/2 once  daily  His updated medication list for this problem includes:    Altace 10 Mg Tabs (Ramipril) .Marland Kitchen... 1 by mouth qd    Aspirin 81 Mg Tbec (Aspirin) .Marland Kitchen... Take one tablet by mouth daily    Metoprolol Succinate 50 Mg Xr24h-tab (Metoprolol succinate) .Marland Kitchen... 1/2 once daily  Orders: EKG w/ Interpretation (93000) Stress Echo (Stress Echo)  Problem # 2:  HYPERLIPIDEMIA (ICD-272.2)  He is scheduled to have a lipid profile with his physical examination with his primary care physician. His updated medication list for this problem includes:    Crestor 40 Mg Tabs (Rosuvastatin calcium) .Marland Kitchen... Take one tablet by mouth daily.  His updated  medication list for this problem includes:    Crestor 40 Mg Tabs (Rosuvastatin calcium) .Marland Kitchen... Take one tablet by mouth daily.  Patient Instructions: 1)  Your physician wants you to follow-up in: 1 year with Dr. Excell Seltzer.  You will receive a reminder letter in the mail two months in advance. If you don't receive a letter, please call our office to schedule the follow-up appointment. 2)  Your physician has requested that you have a stress echocardiogram. For further information please visit [email or web address]  Please follow instruction sheet as given. 3)  Your physician recommends that you continue on your current medications as directed. Please refer to the Current Medication list given to you today.

## 2010-11-16 NOTE — Progress Notes (Signed)
Summary: cough no better  Phone Note Call from Patient Call back at Work Phone [phone]   Caller: Patient Summary of Call: pt was seen 02/20/10, still coughing a lot, clear sputum, no fever --Taking all the meds clarinex, singulair using the inhaler also using mucinex., cough is bothersome  [facility] Initial call taken by: Kandice Hams,  Feb 27, 2010 2:28 PM  Follow-up for Phone Call        Phenergan with codeine 120cc, 1 tsp every 6 hrs as needed . Azithromycin 250 , as per pack Follow-up by: Marga Melnick MD,  Feb 27, 2010 3:19 PM  Additional Follow-up for Phone Call Additional follow up Details #1::        left msg for pt, rx faxed to  [facility] .Kandice Hams  Feb 27, 2010 3:48 PM  Additional Follow-up by: Kandice Hams,  Feb 27, 2010 3:48 PM    New/Updated Medications: PROMETHAZINE VC/CODEINE 6.25-5-10 MG/5ML SYRP (PHENYLEPH-PROMETHAZINE-COD) 1 tsp q 6 hrs as needed AZITHROMYCIN 250 MG TABS (AZITHROMYCIN) as per pack Prescriptions: AZITHROMYCIN 250 MG TABS (AZITHROMYCIN) as per pack  #1 x 0   Entered by:   Kandice Hams   Authorized by:   Marga Melnick MD   Signed by:   Kandice Hams on 02/27/2010   Method used:   Printed then faxed to ...       [facility] [address] [phone]* (retail)       [address]       [place], [place]  [address]       Ph: [phone] or [phone]       Fax: [phone]   RxID:   [id number] PROMETHAZINE VC/CODEINE 6.25-5-10 MG/5ML SYRP (PHENYLEPH-PROMETHAZINE-COD) 1 tsp q 6 hrs as needed  #120cc x 0   Entered by:   Kandice Hams   Authorized by:   Marga Melnick MD   Signed by:   Kandice Hams on 02/27/2010   Method used:   Printed then faxed to ...       [facility] [address] [phone]* (retail)       [address]       [place], [place]  [address]       Ph: [phone] or [phone]       Fax: [phone]   RxID:   [id number]   Appended Document: cough no  better pt called back rx still not at pharmacy. pt states that he uses [facility]. rx verbally called in per phone note 02-27-10

## 2010-11-16 NOTE — Assessment & Plan Note (Signed)
Summary: congested/drainage/cbs   Vital Signs:  Patient profile:   70 year old male Weight:      261 pounds BMI:     35.82 Temp:     98.4 degrees F oral Pulse rate:   72 / minute Resp:     14 per minute BP sitting:   130 / 80  (left arm) Cuff size:   large  Vitals Entered By: Shonna Chock (Feb 17, 2010 1:50 PM) CC: CONGESTION X 1 WEEK Comments REVIEWED MED LIST, PATIENT AGREED DOSE AND INSTRUCTION CORRECT    CC:  CONGESTION X 1 WEEK.  History of Present Illness: Onset 1 week as rhinitis & cough with clear sputum followed by persistant chest congestion.Rx: Mucinex, Benadryl w/o help  Allergies (verified): No Known Drug Allergies  Review of Systems General:  Complains of malaise; denies chills, fever, and sweats. ENT:  Denies ear discharge, earache, nasal congestion, and sinus pressure; No frontal headache , facial pain or purulence. Resp:  Complains of shortness of breath, sputum productive, and wheezing; denies chest pain with inspiration; no purulence. Allergy:  Denies itching eyes and sneezing.  Physical Exam  General:  Well-developed,well-nourished,in no acute distress; alert,appropriate and cooperative throughout examination Ears:  External ear exam shows no significant lesions or deformities.  Otoscopic examination reveals clear canals, tympanic membranes are intact bilaterally without bulging, retraction, inflammation or discharge. Hearing is grossly normal bilaterally. TM scarred Nose:  External nasal examination shows no deformity or inflammation. Nasal mucosa are pink and moist without lesions or exudates. Mouth:  Oral mucosa and oropharynx without lesions or exudates.  Teeth in good repair. minimal  uvual erythema.   Lungs:  Normal respiratory effort, chest expands symmetrically. Lungs ; low grade  crackles &  wheezes. Cervical Nodes:  No lymphadenopathy noted Axillary Nodes:  No palpable lymphadenopathy   Impression & Recommendations:  Problem # 1:  COUGH  (ICD-786.2) RAD suggested  Problem # 2:  RHINITIS (ICD-477.9)  His updated medication list for this problem includes:    Clarinex 5 Mg Tabs (Desloratadine) .Marland Kitchen... 1 once daily  Complete Medication List: 1)  Altace 10 Mg Tabs (Ramipril) .Marland Kitchen.. 1 by mouth qd 2)  Crestor 40 Mg Tabs (Rosuvastatin calcium) .... Take one tablet by mouth daily. 3)  Folic Acid 1gram  4)  Multivitamin  5)  Aspirin 81 Mg Tbec (Aspirin) .... Take one tablet by mouth daily 6)  Metoprolol Succinate 50 Mg Xr24h-tab (Metoprolol succinate) .... 1/2 once daily 7)  Ibuprofen 200 Mg Tabs (Ibuprofen) .... 3 by mouth every am 8)  Singulair 10 Mg Tabs (Montelukast sodium) .Marland Kitchen.. 1 once daily 9)  Dulera 200-5 Mcg/act Aero (Mometasone furo-formoterol fum) .Marland Kitchen.. 1-2 puffs every 12 hrs ; gargle & spit  after use 10)  Clarinex 5 Mg Tabs (Desloratadine) .Marland Kitchen.. 1 once daily  Patient Instructions: 1)  Use samples as directed. Report signs of fever Prescriptions: CLARINEX 5 MG TABS (DESLORATADINE) 1 once daily  #14 x 0   Entered and Authorized by:   Marga Melnick MD   Signed by:   Marga Melnick MD on 02/17/2010   Method used:   Samples Given   RxID:   [id number] DULERA 200-5 MCG/ACT AERO (MOMETASONE FURO-FORMOTEROL FUM) 1-2 puffs every 12 hrs ; gargle & spit  after use  #1 x 1   Entered and Authorized by:   Marga Melnick MD   Signed by:   Marga Melnick MD on 02/17/2010   Method used:   Samples Given   RxID:  [id number] SINGULAIR 10 MG TABS (MONTELUKAST SODIUM) 1 once daily  #30 x 1   Entered and Authorized by:   Marga Melnick MD   Signed by:   Marga Melnick MD on 02/17/2010   Method used:   Samples Given   RxID:   [id number]   Appended Document: congested/drainage/cbs singulair samples not given rx verbally given to pharmacy #30 1

## 2010-11-16 NOTE — Assessment & Plan Note (Signed)
Summary: pneu  vaccine//lch   Nurse Visit  CC: Pneumo vacc./kb   Allergies: No Known Drug Allergies  Immunizations Administered:  Pneumonia Vaccine:    Vaccine Type: Pneumovax    Site: right deltoid    Mfr: Merck    Dose: 0.5 ml    Route: IM    Given by: Lucious Groves CMA    Exp. Date: 02/14/2012    Lot #: [id number]    VIS given: 09/19/09 version given October 19, 2010.  Orders Added: 1)  Pneumococcal Vaccine [90732] 2)  Admin 1st Vaccine [16109]

## 2010-12-27 ENCOUNTER — Telehealth (INDEPENDENT_AMBULATORY_CARE_PROVIDER_SITE_OTHER): Payer: Self-pay | Admitting: *Deleted

## 2011-01-02 NOTE — Progress Notes (Signed)
  Phone Note Refill Request Message from:  Patient on December 27, 2010 5:08 PM  Refills Requested: Medication #1:  METOPROLOL SUCCINATE 50 MG XR24H-TAB 1/2 once daily Initial call taken by: Doristine Devoid CMA,  December 27, 2010 5:08 PM    New/Updated Medications: METOPROLOL TARTRATE 25 MG TABS (METOPROLOL TARTRATE) 1/2 tablet two times a day Prescriptions: METOPROLOL TARTRATE 25 MG TABS (METOPROLOL TARTRATE) 1/2 tablet two times a day  #180 x 0   Entered by:   Doristine Devoid CMA   Authorized by:   Marga Melnick MD   Signed by:   Doristine Devoid CMA on 12/27/2010   Method used:   Electronically to        [facility] [address] [phone]* (retail)       [address]       [address]       [place], [place]  [address]       Ph: [phone]       Fax: [phone]   RxID:   [id number]

## 2011-01-14 ENCOUNTER — Other Ambulatory Visit: Payer: Self-pay | Admitting: Internal Medicine

## 2011-02-27 NOTE — Assessment & Plan Note (Signed)
[facility]                            CARDIOLOGY OFFICE NOTE   NAME:[name], [name]                         MRN:          [id number]  DATE:09/06/2008                            DOB:          03-19-1941    [name] called about whether he should stay on Zetia.  He is on both  Zetia and Crestor.  We do not have any of his cholesterol panels in our  system over the past year because I think they had been in Dr. Frederik Pear  system.  I do not have access to that at the present time.  Nevertheless, I think the best thing to do is stop the Zetia and repeat  his lipid profile in about a month and then make a decision depending on  what his LDL and HDL are will be the best treatment for him.  I talked  him by phone tonight and he is going to stop the Zetia now and will put  in the computer to come back for fasting lipid profile in 1 month from  now.  The date today is September 06, 2008.     Mathew Elvera Lennox Juanda Chance, MD, [facility]  Electronically Signed    BRB/MedQ  DD: 09/06/2008  DT: 09/07/2008  Job #: [id number]

## 2011-02-27 NOTE — Op Note (Signed)
NAMESylar, [name] Mathew                  ACCOUNT NO.:  [id number]   MEDICAL RECORD NO.:  [id number]          PATIENT TYPE:  INP   LOCATION:  0002                         FACILITY[facility]  [facility]   PHYSICIAN:  [name], M.D.  DATE OF BIRTH:  [birth year]   DATE OF PROCEDURE:  05/06/2008  DATE OF DISCHARGE:                               OPERATIVE REPORT   PREOPERATIVE DIAGNOSIS:  Right knee medial compartment osteoarthritis,  status post left total knee replacement.   POSTOPERATIVE DIAGNOSIS:  Right knee medial compartment osteoarthritis,  status post left total knee replacement.   PROCEDURE:  Right unicompartmental partial knee replacement, utilizing a  Biomet Oxford knee system, a size large femur, D medial right tibia, and  a 5 mm insert to match the large femur from the right side.   SURGEON:  [name], M.D.   ASSISTANT:  Mathew Glassman. Mann, PA   ANESTHESIA:  General plus a regional femoral block.   DRAINS:  One.   TOURNIQUET TIME:  50 minutes at 250 mmHg.   COMPLICATIONS:  None.   INDICATIONS FOR PROCEDURE:  [name] is a 70 year old male patient of  mine who is status post left knee replacement for ACL deficient  arthritic knee.  He has presented with progressive worsening of right  knee pain.  Based on his radiograph appearance and isolation of his  symptoms on the medial side of the knee, we have discussed the  possibility of partial knee replacement.  The risks and benefits were  discussed, including the standing risks of infection, DVT, component  failure in addition to the potential need to revise this to a total knee  replacement, with progression of arthritis in the lateral or anterior  compartment.  Consent was obtained for the benefit of pain relief.   PROCEDURE IN DETAIL:  The patient was brought to the operative theater  Once adequate anesthesia and preoperative antibiotics with 2 grams Ancef  administered, the patient was positioned supine and a right thigh  tourniquet was placed.  The Oxford leg positioner was then placed with  the leg flexed and abductedand allowed for 120 degrees of flexion.  Following this preparation the leg was prescrubbed, prepped and draped  in sterile fashion.  The leg was exsanguinated and the tourniquet  elevated.  A small median para midline incision was made, followed by a  median arthrotomy.  The patella was subluxed.  Following initial  synovial debridement and partial medial meniscectomy, I removed the  osteophytes off the distal and anterior medial femoral condyles.  There  were no significant osteophytes on the medial notch.   With this preparation, the extramedullary rod was positioned in the  tibia.  Using reciprocating saw on the lateral aspect of the medial  tibial plateau, first followed by the oscillating saw, I removed a  section a section of bone.  We measured this on the back table to be a  size D.  A D tray was placed, and with this resection I felt that the  feeler gauge felt good with appropriate tension.   The cut  was perpendicular to the to the tibia in both planes.  We  __________ into the __________.   Attention was now directed to the femur.  The femoral canal was opened,  using a starting awl with a drill, followed by placement of  intramedullary rod__________  hand pressure.   I then placed the feeler gauge and knee guide for the posterior cut.  The knee guide was positioned in the center of the medial femoral  condyle, and it was parallel to the intramedullary rod and __________.  Once it was in position I drilled  lug holes.  At this point we placed a  posterior femoral cutting block in place, retractor was placed to  protect the MCL.  An oscillating saw was used to remove the posterior  bone spurs.  At this point the __________ was placed and enough distal  femur removed as necessary.  I then did a trial with this, and removal  of the posterior meniscus; I felt that the 5 feeler  gauge felt best, the  4 was a bit too loose for me.  With this the knee brought through  extension, I was unable to really get the size 1 feeler gauge in place.  For this reason I chose the size 5 spigot.   The 5 spigot was placed, the distal femur milled and __________ removed  as necessary.  I then used __________ removed using a __________ curved  osteotome.  The anterior femur brought to the area of the __________.   At this point trial  reduction was carried out.  I was very happy with  the way that the size 5 feeler gauge felt with the knee at 90 degrees of  flexion, and then at 20 degrees of flexion.  The knee came out to full  extension, with a stable medial collateral ligament complex.   At this point all trial components were removed.  The tibial tray was  then held into position with a Hope retractor.  I then pinned it in  place, then using a reciprocating saw I created a trough for the keel.  I then cleared this out with the appropriate instrument.   At this point we placed the keeled trial size D tibial tray in place  with the trial femur, and the trial size 5; at 90 degrees of flexion and  20 degrees flexion there was a millimeter or two of play of the trial,  indicating a balanced knee with the appropriate polyethylene insert.   At this point all trials were removed, I drilled __________ drill.  I  then removed remaining bone as necessary.  Pulse lavage was used to  irrigate the knee.  All the components were opened after identification.  The cement was mixed.  The components were cemented into position with  single cement technique.  I placed and cementedin the tibia first,  brought the knee to 45 degrees of flexion, applied the feeler gauge and  appliedpressure, removing any remaining extruded cement.  This allows me  to visualize the posterior aspect of the knee.  The final stem was  placed into the knee a minute later, and the knee brought to 45 degrees  of flexion  with pressure applied here.   Once the cement had cured and extruded cement removed, __________ on the  posterior aspect of the knee using the tibial surfaceas a mirrored  image, I did not see any cement on the posterior aspect of the femur  that needed  to be removed.  We re-irrigated the knee following this, and  then placed the final size insert __________.  A medium Hemovac drain  was placed deep.  We reapproximated the extensor mechanism of the knee  in flexion, using a #1  [place] a 2-0 Vicryl and a running 4-0 Monocryl.  The knee was  cleaned, dried and dressed sterilely with Steri-Strips and a bulky  sterile dressing.   The patient was brought to the recovery room in stable condition.  He  tolerated the procedure well.      Mathew Frankel Charlann [name], M.D.  Electronically Signed     MDO/MEDQ  D:  05/06/2008  T:  05/06/2008  Job:  [id number]

## 2011-02-27 NOTE — Assessment & Plan Note (Signed)
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  He had a  diaphragmatic wall infarction in 2000, treated with stenting of the  right coronary artery by Dr. Gerri [name] and had subsequent PCI for in-  stent restenosis.  He has done quite well since that time with no recent  chest pain, shortness breath, or palpitations.   He is here today for preoperative clearance prior to knee surgery.  Dr.  Durene [name] plans to do a partial knee replacement, which is scheduled  in about 2 weeks.   OTHER PAST HISTORY:  Significant for hypertension, hyperlipidemia, and  degenerative joint disease.  He has had previous total knee replacement  on the left.   CURRENT MEDICATIONS:  1. Altace 10 mg daily.  2. Toprol 50 mg daily.  3. Ibuprofen.  4. Multivitamins.  5. Fish oil.  6. Crestor 20 mg daily.  7. Zetia 10 mg daily.  8. Aspirin 81 mg daily.   He indicated he had lipid profile by Dr. Alwyn [name] recently, it was good  except for persistently low HDL.   PHYSICAL EXAMINATION:  VITAL SIGNS:  Blood pressure was 164/94 and the  pulse 68 and regular (his blood pressure was normal in Dr. Frederik [name]  office according to his account).  NECK:  There was no venous distension.  The carotid pulses were full  without bruits.  CHEST:  Clear.  HEART:  Rhythm is regular.  No murmurs or gallops.  ABDOMEN:  Soft without organomegaly.  Peripheral pulses are full with no  peripheral edema.   An electrocardiogram showed an old diaphragmatic wall infarction with  left axis deviation.   IMPRESSION:  1. Coronary artery disease, status post diaphragmatic wall infarction.  2. Stenting of the right  coronary artery in 2000 with repeat PCI for      restenosis in 2001, now stable.  3. Good left ventricular function.  4. Hypertension.  5. Hyperlipidemia.   RECOMMENDATIONS:  I think [name] is doing well from a standpoint of the  heart.  He still needs to lose weight and I talked with him about that  and perhaps after he gets his knee fixed, he will be able to be more  active.  We will plan to get adenosine rest stress Myoview scan next  week for preop evaluation.  This is okay.  I think he is okay to proceed  with surgery.  I will plan to see him back in followup in a year.     Bruce Elvera Lennox Juanda Chance, MD, [facility]  Electronically Signed    BRB/MedQ  DD: 04/15/2008  DT: 04/16/2008  Job #: [id number]

## 2011-02-27 NOTE — H&P (Signed)
NAMEJeron, [name] [name]                  ACCOUNT NO.:  [id number]   MEDICAL RECORD NO.:  [id number]         PATIENT TYPE:  LINP   LOCATION:                               FACILITY:  [facility]   PHYSICIAN:  Madlyn Frankel. Charlann Boxer, M.D.  DATE OF BIRTH:  02/01/1941   DATE OF ADMISSION:  05/06/2008  DATE OF DISCHARGE:                              HISTORY & PHYSICAL   PROCEDURE:  Will be a right unicondylar knee replacement.   CHIEF COMPLAINTS:  Right medial knee pain.   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  It has been refractory to all conservative treatment  including oral anti-inflammatories and cortisone injection.  He has been  presurgically cleared for this right partial knee replacement.   PAST MEDICAL HISTORY INCLUDES:  1. Osteoarthritis.  2. Coronary artery disease with stenting in the past.  3. Hypercholesteremia.   PAST SURGICAL HISTORY:  1. Left total knee replacement in 2006.  2. Appendectomy and gallstones in 2007.   FAMILY HISTORY:  Heart disease.   SOCIAL HISTORY:  He is married, a retired Education officer, community.  Primary caregiver  will be his wife in the home.   DRUG ALLERGIES:  Known drug allergies.   CURRENT MEDICATIONS:  1. Aspirin 325 mg p.o. daily.  2. Altace 10 mg p.o. daily.  3. Crestor 20 mg p.o. daily.  4. Folic acid 800 mg p.o. daily.  5. Toprol 50 mg p.o. daily.  6. Zetia 10 mg p.o. daily.   REVIEW OF SYSTEMS:  None other than in HPI.   PHYSICAL EXAMINATION:  VITAL SIGNS:  A 6 foot, 245-pound male.  Pulse  72, respirations 16, blood pressure 144/88.  GENERAL:  Awake, alert and oriented, well-developed, well-nourished.  NECK:  Supple. No carotid bruits.  CHEST:  Lungs are clear to auscultation bilaterally.  BREASTS:  Deferred.  HEART:  Regular rate and rhythm.  S1-S2 distinct.  ABDOMEN:  Soft, nontender, bowel sounds present.  GENITOURINARY:   Deferred.  EXTREMITIES:  Right medial knee pain.  Full extension.  SKIN:  No cellulitis.  NEUROLOGIC:  Intact distal sensibilities.   LABORATORY DATA:  Labs, EKG, chest x-ray, and pending presurgical  testing.   IMPRESSION:  Right medial knee osteoarthritis.   PLAN OF ACTION:  Right unicondylar knee replacement at [facility] May 06, 2008, by surgeon Dr. Durene Romans.  Risks and  complications were discussed.   Postoperative medications were provided at the time of history of  physical including Lovenox Robaxin iron, aspirin, Colace, and MiraLax.  Celebrex will be used perioperatively as well 200 mg twice per day for 2  weeks after surgery.  Pain medicine will be prescribed at the time of  surgery.     ______________________________  Yetta Glassman [address], [place]      Madlyn Frankel. Charlann Boxer, M.D.  Electronically Signed    BLM/MEDQ  D:  05/05/2008  T:  05/05/2008  Job:  [id number]   cc:   Titus Dubin. Alwyn Ren, MD,FACP,FCCP  [address] [place]  [place] [address]   Everardo Beals. Juanda Chance, MD, [facility]  [address]  [place], [place] [address]

## 2011-02-27 NOTE — Assessment & Plan Note (Signed)
[facility]                            CARDIOLOGY OFFICE NOTE   NAME:[name], [name]                         MRN:          [id number]  DATE:10/01/2007                            DOB:          1940/11/10    PRIMARY CARE PHYSICIAN:  Titus Dubin. Alwyn Ren, MD,FACP,FCCP.   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  In 2000, he had a diaphragmatic wall infarction, treated with stenting  of the right coronary artery by Dr. Gerri Spore and a year later, had PCI  for in-stent restenosis.  He has done quite well since that time.  He  has had no chest pain, shortness of breath or palpitations.   PAST MEDICAL HISTORY:  Significant for  1. Hypertension.  2. Hyperlipidemia.  3. Degenerative joint disease, status post total knee replacement.   MEDICATIONS:  Altace, Toprol, aspirin, ibuprofen, fish oil, Crestor and  Zetia.   PHYSICAL EXAMINATION:  VITAL SIGNS:  Blood pressure 140/85, pulse 73 and  regular.  NECK:  There was no venous distension.  The carotid pulses were full  without bruits.  CHEST:  Clear.  CARDIOVASCULAR:  Regular rhythm.  I could hear no murmurs or gallops.  ABDOMEN:  Soft without organomegaly.  EXTREMITIES:  Peripheral pulses were full.  No peripheral edema.   Electrocardiogram showed an old diaphragmatic wall infarction that had  not changed.   IMPRESSION:  1. Coronary artery disease status post diaphragmatic wall infarction      treated with stenting of the right coronary artery in 2000 with      repeat percutaneous coronary intervention for restenosis in 2001,      now stable.  2. Good left ventricular function.  3. Hypertension.  4. Hyperlipidemia.   [name]'s recent lipid profile at Dr. Frederik Pear office showed a cholesterol  of 106 and HDL of 37, LDL 50 and triglycerides 94.   RECOMMENDATIONS:  I think [name] is doing quite well.  His HDL is still  not at target and his blood pressure is slightly elevated as  well.  He  is to work on his weight and diet and exercise to  see if he can improve on both of these.  We will plan to see him back in  follow-up in a year.  I told him to take a baby-sized aspirin.     Bruce Elvera Lennox Juanda Chance, MD, [facility]  Electronically Signed    BRB/MedQ  DD: 10/01/2007  DT: 10/01/2007  Job #: [id number]

## 2011-03-02 NOTE — Cardiovascular Report (Signed)
[facility]  Patient:    [name], [name]                         MRN: [id number] Proc. Date: 04/26/00 Adm. Date:  [id number] Attending:  Daisey Must CC:         Titus Dubin. Alwyn Ren, M.D. [facility]             Daisey Must, M.D. [facility]             Cardiac Catheterization Laboratory                        Cardiac Catheterization  PROCEDURE PERFORMED: 1. Percutaneous transluminal coronary angioplasty utilizing the Cutting    Balloon of the distal right coronary artery and ostial posterior descending    artery for in-stent re-stenosis. 2. Percutaneous transluminal coronary angioplasty of the distal right coronary    artery and posterolateral artery through the stent side struts.  INDICATIONS:  [name] is a 70 year old male, who experienced an acute inferior wall myocardial infarction in August of last year.  He was treated with stent placement x 3 in the right coronary artery.  He has been doing well without symptoms.  However, a stress Cardiolite showed a moderate amount of inferior ischemia.  We therefore proceeded with cardiac catheterization earlier today which revealed the presence of a 95% in-stent re-stenosis in the distal right coronary artery and the stent which extended into the proximal portion of the posterior descending artery.  Because of an emergency procedure, the patient was removed from the table after diagnostic catheterization.  He was then brought back to the laboratory for planned intervention.  DESCRIPTION OF PROCEDURE:  The preexisting sheath in the right femoral artery was exchanged over a wire for a 7 French sheath.  Heparin and Integrilin were administered per protocol.  We used a 7 [place] guiding catheter with side holes.  A BMW wire was advanced under fluoroscopic guidance and beyond the lesion into the distal aspect of the posterior descending artery.  We also advanced a Hi-Torque Floppy wire beyond the lesion through the  side struts of the stent into the posterolateral artery rising from the distal right coronary artery.  We then used 3.5 x 15 mm PowerSail balloon and positioned this across the lesion inflating it to 6 atmospheres.  We then placed a 3.5 x 15 mm Cutting Balloon across the lesion and inflated it to 6 atmospheres for three inflations.  We then went back with a 3.5 x 15 mm PowerSail balloon and positioned this again within the stent inflating it to 14 and then to 16 atmospheres.  We then advanced a 2.5 x 15 mm CrossSail balloon over the floppy wire and through the side struts into the stent in the posterolateral arterial system.  This balloon was inflated to 8 atmospheres.  At that point there was significant recoil within the dilated portion of the stent.  We therefore went back with a 3.5 x 15 mm Cutting Balloon and positioned this across the area of stenosis and inflated it to 8, followed by 10, followed by a second inflation for 10 atmospheres.  Finally we went back with a 2.5 x 15 mm CrossSail balloon through the side struts to the stent, inflated this to 8 and then 10 atmospheres.  Final angiographic images revealed patency of the distal right coronary with 25% residual stenosis and TIMI-3 flow into the  distal vessel. Patency of the posterolateral arterial system was maintained.  COMPLICATIONS:  None.  RESULTS:  Successful complex PTCA utilizing the Cutting Balloon of in-stent re-stenosis in the distal right coronary artery and ostial and proximal posterior descending artery.  A 95% stenosis was reduced to 25% residual with TIMI-3 flow.  Patency of the posterolateral arterial system was maintained as described above.  PLAN:  Integrilin will be continued for 18 hours.  Plavix will be administered for four weeks.  If the patient has recurrent in-stent re-stenosis would consider the use of brachytherapy. DD:  04/26/00 TD:  04/27/00 Job: [id number]

## 2011-03-02 NOTE — Discharge Summary (Signed)
NAMEJaber, [name]                  ACCOUNT NO.:  [id number]   MEDICAL RECORD NO.:  [id number]          PATIENT TYPE:  INP   LOCATION:  1514                         FACILITY:  [facility]   PHYSICIAN:  Madlyn Frankel. Charlann Boxer, M.D.  DATE OF BIRTH:  11-Oct-1941   DATE OF ADMISSION:  09/04/2005  DATE OF DISCHARGE:  09/06/2005                                 DISCHARGE SUMMARY   ADMISSION DIAGNOSES:  1.  Osteoarthritis of the left knee.  2.  Hypertension.  3.  Coronary artery disease.   DISCHARGE DIAGNOSES:  1.  Osteoarthritis of the left knee.  2.  Hypertension.  3.  Coronary artery disease.  4.  Mild postoperative anemia.   OPERATION:  On September 04, 2005 the patient underwent computer-assisted  left total knee replacement arthroplasty utilizing the DePuy rotating  platform.  All three compartments were cemented.  [name], P.A.-C.  assisted.   CONSULTS:  None.   BRIEF HISTORY:  This 71 year old gentleman has had two-three years of  progressive left knee pain, which now in interfering with his day-to-day  activity.  He has stiffness and range of motion of the knee.  Conservative  measures with anti-inflammatory medications, as well as injections, have not  really helped him.  After much discussion, including the risks and benefits  of surgery, he decided he would benefit with surgical intervention and was  admitted for the above procedure.   COURSE IN THE HOSPITAL:  The patient tolerated the surgical procedure quite  well and entered eagerly into therapy.  The wound remained dry.  Neurovascularly he remained intact with his operative extremity.  He was  having no pain or discomfort postoperatively, other than expected.  Medications p.o. were controlling his discomfort.  He had no respiratory  problems.   He was placed on Coumadin protocol postoperatively for prevention of DVT.  The wound remained dry throughout his hospitalization.  Since he was doing  so well and home health was  arranged with [name], it was felt he could be  maintained in his home environment and arrangements were made for discharge.   LABORATORY VALUES:  In the hospital hematologically showed a preoperative  CBC completely within normal limits.  Hemoglobin was 15.9, hematocrit was  45.7.  The final hemoglobin was 11.8, hematocrit was 34.5.  Blood  chemistries were normal other than a very slight drop in sodium  at 134.  Urinalysis was negative for a urinary tract infection.  A chest x-  ray showed chronic bronchitic changes without acute abnormality.  No  electrocardiogram is seen on this chart.   CONDITION ON DISCHARGE:  Improved and stable.   PLAN:  The patient is discharged to his home.  He is given Vicodin for  discomfort, Coumadin protocol/pharmacy, Robaxin as a muscle relaxant, and  Toradol 10 mg one t.i.d. x3 days.  Weightbearing as tolerated.  Use dressing  p.r.n.  He may use his home outpatient at [facility] for  his outpatient therapy and if that is the case, then we will not have the  pharmacy do the protocol for the  Coumadin, but would use Dr. Frederik [name] office  for pro times, INR, and Coumadin dosing.  The patient will contact Dr.  Alwyn [name] for that.  He is encouraged to call if he has any problems.      [name].      Madlyn Frankel Charlann Boxer, M.D.  Electronically Signed    DLU/MEDQ  D:  09/12/2005  T:  09/13/2005  Job:  [id number]   cc:   [name]. [name], M.D. [facility]  [id number] [place]  [place] [address]
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
## 2011-03-02 NOTE — Op Note (Signed)
NAMEVincente, Asbridge Heston                  ACCOUNT NO.:  [id number]   MEDICAL RECORD NO.:  [id number]          PATIENT TYPE:  INP   LOCATION:  X002                         FACILITY:  [facility]   PHYSICIAN:  Madlyn Frankel. Charlann Boxer, M.D.  DATE OF BIRTH:  1941-09-18   DATE OF PROCEDURE:  09/04/2005  DATE OF DISCHARGE:                                 OPERATIVE REPORT   PREOPERATIVE DIAGNOSES:  End-stage left knee osteoarthritis.   POSTOPERATIVE DIAGNOSES:  End-stage left knee osteoarthritis.   PROCEDURE:  Computer assisted left total knee replacement.   COMPONENTS USED:  DePuy rotating platform posterior stabilized high flex  knee with a size 5 femur, size 5 tibia, a size 12.5 mm posterior stabilized  RP polyethylene liner and a 41 patella button.   SURGEON:  Madlyn Frankel. Charlann Boxer, M.D.   ASSISTANT:  Cherly Beach, P.A.-C.   ANESTHESIA:  General plus a femoral block.   DRAINS:  Drains x1.   COMPLICATIONS:  None.   TOURNIQUET TIME:  110 minutes at 250 mmHg.   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 His pain is worse with activities and prolonged  standing and some stiffness. After several years of conservative measures of  antiinflammatory injections, he wished to not continue with conservative  measures and proceed with surgical intervention. The risks and benefits were  discussed including the use of computer.   Consent was obtained based off the discussion.   DESCRIPTION OF PROCEDURE:  The patient was brought to the operative theater.  Once adequate anesthesia and preoperative antibiotics, 2 grams of Ancef,  were administered, the patient was positioned supinely with a proximal thigh  tourniquet placed. The left lower extremity was then prepped and draped in a  sterile fashion following a prescrub with Betadine. The 4 mm Schanz pins  were then placed into the tibia and  femur. The left lower extremity was then  exsanguinated. The tourniquet was elevated.   A midline incision was made and knee exposure made in the routine fashion.  This concluded the proximal medial peel based on the flexion varus deformity  noted. Once the knee was exposed, the computer setup was carried out  including obtaining the center fit for rotation following all the tibial and  femoral key points. Once this was done, it indicated the patient had a 5  degree flexion deformity and an 8 degree varus deformity.   Once this was identified, attention was directed to the femur. Based on the  flexion deformity, I resected 11 mm of bone off the distal femur. This was  in neutral position based on the position of the hip center to the center of  the ankle. Following this cut and then determining its accuracy, attention  was directed to sizing the femur. I did size the femur first with the  regular sizer noted to be a size 5. This was confirmed by the measurements  on the computer. I then positioned the anterior cutting block and  the pins.  This was positioned anatomically. The anterior, anterior chamfer, and  posterior chamfer cuts were then made. Based on the fact, I was using the  rotating platform high flexion design, I then performed the box cut and the  extra 4 mm cut necessary off the posterior femur. At this point, attention  was directed to the tibia. Tibial exposure was obtained in routine fashion  including meniscectomies. Using the cutting block, I initially pinned it to  take 10 mm of bone off the lateral tibia. After making this cut, the  extensor block would not fit so for that reason we resected an additional 2  mm. The cut on the tibia was confirmed anatomically by the computer. Once  this was confirmed, rotation was marked.   With the trial components in position, the knee was noted with a 10 mm  polyethylene liner to come out to hyperextend about 4 degrees. Based on   this, I decided to use a 12 mm poly.   With the trial components in position, attention was directed to the  patella. The precut patella measurement was 26 1/2 mm. I resected down to 15  mm and placed a 41 patella button with 11 mm thickness.   The patella tracked without any application of pressure.   Following this trial reduction, trial __________ removed, final tibial  preparation was carried out using a size 5 tibia with the rotating platform  system. Final components were brought on the field. The wound was copiously  irrigated with normal saline solution. The knee was then injected with a 0.5  mL of 1:1000 epinephrine, 60 mL of 0.5% Marcaine and 1 mL of 30 mg Toradol.  Once the knee was dry, the cement prepared, components were cemented into  position. The tibia and femur with a 10 mm spacer brought into extension as  well as the patella. Once the cement had dried, excessive cement was  removed. A 12.5 poly was then positioned. Excessive cement removed. The knee  was noted at that point to come out to full extension and was in half degree  to 1 degree of varus. It had equal flexion and extension gaps. Given all  these parameters, final components were removed. Excessive cement removed  from the backside of the knee, the knee irrigated and the final 12.5  posterior stabilized rotating platform poly placed. At this point following  repeat irrigation, the tourniquet was let down. The medium hemovac drain was  placed. Extensor mechanism was reapproximated using #1 PDS in flexion. The  remainder of the wound was closed in layers with 4-0 Monocryl. The wound was  cleaned, dried and dressed sterilely with Steri-Strips, dressing, sponge and  tape. The patient was extubated and brought to the recovery room in stable  condition.      Madlyn Frankel Charlann Boxer, M.D.  Electronically Signed     MDO/MEDQ  D:  09/04/2005  T:  09/04/2005  Job:  [id number]

## 2011-03-02 NOTE — Assessment & Plan Note (Signed)
[name]                              CARDIOLOGY OFFICE NOTE   NAME:[name], [name]                         MRN:          [id number]  DATE:05/01/2006                            DOB:          1941/02/08    PRIMARY CARE PHYSICIAN:  Titus Dubin. 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 Gerri Spore.  He subsequently had repeat angioplasty  for in-stent restenosis in 2001.  He has done quite well since that time,  has had no recent chest pain, shortness of breath or palpitations.  We did a  Cardiolite scan last summer prior to a total knee replacement, and he had  inferior scar with minimal periscar redistribution.   PAST MEDICAL HISTORY:  Significant for hypertension and hyperlipidemia.   CURRENT MEDICATIONS:  1.  Altace.  2.  Toprol.  3.  Multivitamins.  4.  Aspirin.  5.  Ibuprofen.  6.  Vytorin.  7.  Folic acid.  8.  Fish oil.   PHYSICAL EXAMINATION:  The blood pressure was 152/90, and the pulse 62 and  regular.  There was no venous distention.  The carotid pulses were full  without bruits.  CHEST:  Clear.  CARDIAC:  Rhythm was regular.  I hear no murmurs or gallops.  ABDOMEN:  Soft without organomegaly.  Peripheral pulses are full.  There is no peripheral edema.   An ECG showed an old inferolateral infarction.   IMPRESSION:  1.  Coronary artery disease, status post diaphragmatic wall infarction,      treated with stenting in the right coronary artery in 2000, with repeat      percutaneous coronary intervention in 2001, now stable.  2.  Good left ventricular function.  3.  Hypertension.  4.  Hyperlipidemia.   RECOMMENDATIONS:  I think [name] is doing well.  I think the main issues are  related to secondary risk factor modification.  His weight is up about 12  pounds from his lightest weight  over the past number of years, and I  encouraged him to lose weight.  He had previously been on the [employer] and I encouraged him to get back on board with that.  His blood  pressure is also up, and I think losing weight, watching the salt in his  diet would be helpful.  We will repeat that when he comes in for laboratory  tests.  We will have him come back for a fasting lipid and liver, CBC and  BNP.  I will see him back in followup in a year.                               Bruce Elvera Lennox Juanda Chance, MD, [facility]    BRB/MedQ  DD:  05/01/2006  DT:  05/02/2006  Job #:  [id number]

## 2011-03-02 NOTE — Op Note (Signed)
NAMEESAUL, DORWART                            ACCOUNT NO.:  [id number]   MEDICAL RECORD NO.:  [id number]                   PATIENT TYPE:  INP   LOCATION:  1827                                 FACILITY:  [facility]   PHYSICIAN:  Sandria Bales. Ezzard Standing, M.D.               DATE OF BIRTH:  11/25/1940   DATE OF PROCEDURE:  05/12/2004  DATE OF DISCHARGE:                                 OPERATIVE REPORT   PREOPERATIVE DIAGNOSIS:  Acute appendicitis.   POSTOPERATIVE DIAGNOSIS:  Acute appendicitis.   PROCEDURE:  Laparoscopic appendectomy.   SURGEON:  Sandria Bales. Ezzard Standing, M.D.   ASSISTANT:  None.   ANESTHESIA:  General endotracheal with about 15 mL of local anesthetic.   COMPLICATIONS:  None.   INDICATIONS FOR PROCEDURE:  Mr. Coles is a 70 year old white male who  presented with signs and symptoms and CT scan evidence of acute  appendicitis.  I discussed with him and his wife about proceeding with  laparoscopic appendectomy.  We discussed the indications and potential  complications which included but not limited to bleeding, infection, bowel  injury, and the need for open surgery.  The patient also has an identified  gallstones but we will not do any kind of gallbladder surgery today.  He  also has a questionable mass in the posterior stomach and we will address  this at a later date.   DESCRIPTION OF PROCEDURE:  The patient was placed in a supine position and a  Foley catheter was placed.  His abdomen was shaved, prepped with Betadine  solution and sterilely draped.  He had a given general endotracheal  anesthetic supervised by Dr. Sharee Holster.  An infraumbilical incision was  made with sharp dissection carried down to the abdominal cavity.  A 12 mm  Hasson trocar was inserted into the abdominal cavity and secured with a 0  Vicryl suture.  A 0 degree 10 mm laparoscope was inserted into the abdomen  and the abdomen explored.  The right and left lobes of the liver were  unremarkable.  The  gallbladder had no evidence of any inflammation around  it, it had a fairly normal appearance externally.  The anterior wall of the  stomach was mildly distended but there was no mass nodularity.  The  remainder of his bowel was covered, pretty much, with omentum.  Two  additional trocars were placed, a 5 mm applied trocar in the right subcostal  location and a 10 mm-11 mm trocar in the left lower quadrant.  The appendix  was identified lateral kind of stuck along the gutter lateral to the colon.  It was grasped and mobilized.  The Harmonic scalpel was used to divide the  mesentery.  I used the blue load of the endo-GIA stapler size 30 to staple  across the base of the appendix.  The appendix was then placed in an  endocatch bag and delivered through  the umbilicus.  I then irrigated the  abdomen with 500 liters of saline.  There was no bleeding.  There was no  evidence of bowel injury.  There was some purulent contamination around the  appendix but this had been pretty much cleaned up with the irrigation  section.  The umbilical port was closed with a 0 Vicryl suture.  The skin at  each site had been  infiltrated with 0.25% Marcaine using about 15 mL.  The skin was closed with  a 5-0 Vicryl suture and painted with tincture of Benzoin.  The patient  tolerated the procedure well, was transported to the recovery room in good  condition.  Sponge and needle counts were correct at the end of the case.                                               Sandria Bales. Ezzard Standing, M.D.    DHN/MEDQ  D:  05/12/2004  T:  05/12/2004  Job:  [id number]   cc:   Titus Dubin. Alwyn Ren, M.D. [facility]   Carole Binning, M.D. [facility]

## 2011-03-02 NOTE — Consult Note (Signed)
[name], [name]                            ACCOUNT NO.:  [id number]   MEDICAL RECORD NO.:  [id number]                   PATIENT TYPE:  INP   LOCATION:  1827                                 FACILITY:  [facility]   PHYSICIAN:  Sandria Bales. Ezzard Standing, M.D.               DATE OF BIRTH:  [birth year]   DATE OF CONSULTATION:  05/12/2004  DATE OF DISCHARGE:                                   CONSULTATION   HISTORY OF PRESENT ILLNESS:  This is a 70 year old white male who has been  in otherwise normal health.  He has been a little bit heavy and has been on  the [facility] Diet trying to lose some weight and maybe lost 10 or 15  pounds over the last several months.  Anyway he had some __________ last  night, May 11, 2004, started feeling abdominal pain which started in his  left upper quadrant at 10 p.m. but his pain then radiated to his right  abdomen.  He vomited once.  He presented to the [facility] Emergency Room  where he was seen and evaluated with a CT scan which shows appendicitis with  a retrocecal appendix on the right side.  He was also noted to have a  gallstone on the CT scan.  There was also questionable posterior filling  defect in his stomach but this may just be all food.   PAST GI HISTORY:  He has no history of peptic ulcer disease, liver disease,  pancreatic disease.  He did have a negative colonoscopy by Dr. Griffith [name], M.D., within the last year or two and Dr. Kinnie Scales suggested follow-up  in 10 years.   ALLERGIES:  No known drug allergies.   CURRENT MEDICATIONS:  1. Toprol 50 mg daily.  2. Altace 10 mg daily.  3. Lipitor 80 mg daily.  4. Aspirin 325 mg daily.  5. Zetia one tablet daily.   REVIEW OF SYMPTOMS:  NEUROLOGIC:  No history of seizure of loss of  consciousness.  PULMONARY:  He has had pneumonia at least twice and treated  as an outpatient but has not smoked cigarettes and has no asthma or other  chronic lung conditions.  CARDIAC:  He had a heart attack in  August of 2000,  has undergone cardiac stents and followed by Dr. Emilie Rutter. Pulsipher.  Apparently underwent a coronary angiography in 2001 which was good.  He saw  Dr. Gerri Spore this past spring in May and apparently was stable at that  time.  He has  had no cardiac symptoms since really his heart attack and  stenting in 2000.  He does have hypertension and has hypercholesterolemia  for which he is being treated.  GASTROINTESTINAL:  See history of present  illness.  UROLOGIC:  No history of kidney stones or kidney infections.   SOCIAL HISTORY:  He is a retired Education officer, community.  His wife is  in the emergency room  with him.   PHYSICAL EXAMINATION:  GENERAL APPEARANCE:  He is a well-nourished, tan,  mildly obese white male.  VITAL SIGNS:  His temperature is 97, blood pressure 140/74, pulse 77,  respiratory rate 18.  HEENT:  Unremarkable.  NECK:  Supple.  I feel no mass, no thyromegaly.  No lymphadenopathy.  He has  supraclavicular or axillary adenopathy.  LUNGS:  Clear to auscultation with symmetric breath sounds.  CARDIOVASCULAR:  Regular rate and rhythm. I hear no murmur or rub.  ABDOMEN:  His abdomen is soft.  He does have some mild to moderate  tenderness on his right side and right lower quadrant, though actually he  has less peritoneal signs than you would think for somebody who has acute  appendicitis.  He has no organomegaly, no hernia, no mass.  EXTREMITIES:  He has good strength in upper and lower extremities.  NEUROLOGIC:  Grossly intact.   LABORATORY DATA:  The labs that I have show a white blood cell count of  12,100, hemoglobin of 14.6, hematocrit 41.7, platelet count 166,000;  neutrophils 91%, lymphocytes 6%.  Sodium 135, potassium 3.9, chloride 107,  CO2 24, glucose 151, BUN of 21, creatinine 1.0.  His lipase is 32, amylase  61.  CK-MB 1.7 which was normal.  His troponin was 0.001.  His urinalysis  was negative.   EKG showed, if I remember correctly, an old inferior MI age  undetermined.   IMPRESSION:  1. Acute appendicitis. I reviewed the films with Dr. Dwyane Luo. Fischer, and     certainly with his symptoms and CT scans are consistent with this,     discussed surgery with the patient, discussed both laparoscopic and open     surgery, possibility of risk, bleeding and infection, the possibility of     need for open surgery and will proceed with laparoscopic appendectomy     today.  2. Gallstone identified.  Told him we would probably work this up at a later     date.  Would not do it at the same time as this surgery and will see him     back probably to obtain an outpatient ultrasound.  3. Possible posterior gastric mass on CT scan, though this may be food     remnant and will follow this up easily with upper endoscopy or upper GI     at a later date.  4. Significant coronary artery disease status post stenting and MI but     stable over the last four to five years.  5. Hypertension.  6. Hypercholesterolemia.                                               Sandria Bales. Ezzard Standing, M.D.    DHN/MEDQ  D:  05/12/2004  T:  05/12/2004  Job:  [id number]   cc:   Carole Binning, M.D. [facility]

## 2011-03-02 NOTE — H&P (Signed)
NAMECeejay, Kegley Sandro                  ACCOUNT NO.:  [id number]   MEDICAL RECORD NO.:  [id number]           PATIENT TYPE:   LOCATION:                                 FACILITY:   PHYSICIAN:  Madlyn Frankel. Charlann Boxer, M.D.  DATE OF BIRTH:  April 05, 1941   DATE OF ADMISSION:  DATE OF DISCHARGE:                                HISTORY & PHYSICAL   ADMISSION DIAGNOSIS:  Left knee osteoarthritis.   SECONDARY DIAGNOSIS:  Hypertension and coronary artery disease.   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  We  have been following this along in the office for the past 2 years.  He has  persistent and recurrent discomfort in his left knee with activity limiting  his functional capacity.  He has tried injections without significant relief  of symptoms and wishes at this point to proceed with total knee replacement  surgery.  The risks and benefits of further nonoperative care versus  operative care have been discussed at length.  Based on all of our  discussions, he opts for left total knee replacement.   PAST MEDICAL HISTORY:  Coronary artery disease, status post angioplasty and  stent placement.  History of hypertension.   PAST SURGICAL HISTORY:  Negative.   MEDICATIONS:  1.  Vytorin.  2.  Toprol.  3.  Altace.  4.  Aspirin.   ALLERGIES:  No known drug allergies.   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  He denies smoking, but has social alcohol  use.  He has been working on weight loss.   REVIEW OF SYSTEMS:  Otherwise unremarkable over the past 2 weeks with no  upper respiratory, pulmonary, cardiac, gastrointestinal, genitourinary  issues.  He has been cleared by his primary care physician, Titus Dubin.  Alwyn Ren, M.D. [facility], as well as his cardiologist, Charlies Constable, M.D. [facility].   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  VITAL SIGNS:  Temperature 98.1, pulse 56, blood pressure 135/72.  HEENT:  He has no evidence of any facial asymmetry.  He has normal speech,  normal ocular motions.  NECK:  Supple with no bruits detected.  No nodes detected.  CHEST:  Clear to auscultation bilaterally.  HEART:  Regular rate and rhythm with no murmurs.  ABDOMEN:  Soft and nontender with positive bowel sounds.  EXTREMITIES:  Examination of the left lower extremity reveals slight limp  favoring his left knee.  He continues to have some soft pinpoint with  Lachman, but has no effusion today.  He has tenderness over the medial  aspect of the knee primarily with stable ligaments.  Otherwise  neurovascularly intact.  SKIN:  Normal.  No evidence of any cellulitis.   RADIOGRAPHS:  Indicate left knee osteoarthritis.   ASSESSMENT:  Left knee osteoarthritis.   PLAN:  The patient will be admitted for same-day surgery on September 04, 2005, for left total knee replacement.  This will be performed with a  computer-assisted approach.  We discussed his perioperative hospital stay,  risks, benefits.  Consent will be obtained and also discussion today.  Questions were encouraged, answered and viewed at today's discussion.      Madlyn Frankel Charlann Boxer, M.D.  Electronically Signed     MDO/MEDQ  D:  08/27/2005  T:  08/27/2005  Job:  [id number]

## 2011-03-02 NOTE — Discharge Summary (Signed)
[place]  Patient:    EDISON, NICHOLSON                         MRN: [id number] Adm. Date:  [id number] Disc. Date: 04/27/00 Attending:  Daisey Must Dictator:   Abelino Derrick, P.A.C. [facility]                  Referring Physician Discharge [place]  DISCHARGE DIAGNOSES: 1. Abnormal Cardiolite study, elective right coronary artery intervention this    admission. 2. Known coronary disease with a history of diaphragmatic myocardial    infarction August 2000, treated with right coronary artery stenting. 3. Hypertension. 4. Hyperlipidemia.  HOSPITAL COURSE:  Mr. Tonna Corner is a 70 year old male who had an inferior MI in August 2000, treated with stents x 3 to the RCA.  He had an outpatient Cardiolite study done that was abnormal, and was admitted for elective catheterization.  This was done April 26, 2000 by Dr. Gerri Spore. Catheterization revealed distal RCA disease after the stents insertion.  He underwent elective RCA and PDA via the stents, with good final result.  He was kept on Integrilin for 18 hours post procedure, and will be placed on Plavix for four weeks.  He will follow-up with Dr. Gerri Spore in about two to three weeks.  DISCHARGE MEDICATIONS: 1. Toprol XL 50 mg a day. 2. Coated aspirin q.d. 3. Lipitor 10 mg a day. 4. Plavix 75 mg a day for four weeks. 5. Nitroglycerin sublingual p.r.n.  LABORATORY DATA:  From the office, reveal white count 6.9, hemoglobin 16.4, hematocrit 48.4, platelets 250.  Sodium 136, potassium 4.8, BUN 25, creatinine 1.2.  INR is 1.0.  EKG shows normal sinus rhythm, left axis deviation.  DISPOSITION:  Patient discharged in stable condition.  FOLLOW-UP:  With Dr. Gerri Spore. DD:  04/27/00 TD:  04/27/00 Job: [id number]

## 2011-03-02 NOTE — Cardiovascular Report (Signed)
[place]. [facility]  Patient:    [name], [name]                         MRN: [id number] Proc. Date: 04/26/00 Adm. Date:  [id number] Attending:  Daisey Must CC:         Titus Dubin. Alwyn Ren, M.D. [facility]             Cardiac Catheterization Lab                        Cardiac Catheterization  PROCEDURES PERFORMED:  Left heart catheterization with coronary angiography and left ventriculography.  INDICATIONS:  [name] is a 70 year old male who presented initially last August with an acute inferior myocardial infarction.  He was treated with PTCA and stent placement x 3 in the right coronary artery.  A Cardiolite scan recently done in the office showed mild inferior infarct with moderate peri-infarct ischemia.  He is therefore brought back for re-look catheterization.  PROCEDURAL NOTE:  A 6-French sheath was placed in the right femoral artery. Standard Judkins 6-French catheters were utilized.  Contrast was Omnipaque. There were no complications.  CATHETERIZATION RESULTS:  Hemodynamics:  Left ventricular pressure 142/20.  Aortic pressure 138/86. There is no aortic valve gradient.  Left ventriculogram:  There is mild hypokinesis of the inferior wall but overall preserved left ventricular function.  Ejection fraction is estimated at 60%.  Coronary arteriography (right dominant): 1. The left main has an ostial 25% stenosis. 2. The LAD has a 40% stenosis in the proximal vessel.  The mid vessel has a    50% stenosis with an associated ulceration.  The distal vessel has a    diffuse 20% stenosis.  The LAD gives rise to a normal sized first diagonal,    a small second diagonal, and a normal third diagonal.  The third diagonal    has diffuse 25% stenosis. 3. The left circumflex gives rise to a small OM-1, normal sized OM-2, and a    small OM-3.  The OM-2 has a 25% stenosis. 4. The right coronary artery is a dominant vessel.  There is a 60% stenosis in    the  proximal vessel.  Following this 60% stenosis is a stent in the    proximal vessel which is widely patent.  The mid vessel has a 30% stenosis.    In the distal vessel is a second stent which has a diffuse 25% stenosis    throughout the second stent.  There is a third stent overlapping with the    second stent which extends into the posterior descending artery.  There is    a discrete 95% stenosis in the third stent in the distal right coronary    artery just proximal to the posterior descending artery.  Extending into    the posterior descending artery is an 80% stenosis.  The mid posterior    descending artery has a 30% stenosis.  The right coronary artery gives rise    to a large sized posterior descending artery, small first and second    posterolateral branches, and a normal third posterolateral branch.  IMPRESSIONS: 1. Preserved left ventricular systolic function. 2. Two-vessel coronary artery disease as described.  There is moderate but    nonobstructive disease in the left anterior descending artery.  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  PLAN:  Percutaneous  intervention to the right coronary artery. DD:  04/26/00 TD:  04/26/00 Job: [id number]

## 2011-03-02 NOTE — H&P (Signed)
[name], [name]                            ACCOUNT NO.:  [id number]   MEDICAL RECORD NO.:  [id number]                   PATIENT TYPE:  EMS   LOCATION:  [facility]                                 FACILITY:  [facility]   PHYSICIAN:  Titus Dubin. Alwyn Ren, M.D. 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  He ate from 8 to 9 p.m. this evening, ingesting  mussels and prime rib and green beans.  By 10 o'clock he was experiencing  nausea and vomiting and vomited two to three times with green, bilious  material.  He was having discomfort up to a level of 6/10 in the left upper  quadrant.  His wife gave him some Pepto Bismol with minimal response.  He  denies any other GI symptoms or genitourinary symptoms.  He is a coronary  patient and had three stents.   Other past history includes hyperlipidemia and tonsillectomy and  adenoidectomy.   His mother had COPD __________, and his father died at 80 of myocardial  infarction.  Brother had a five-vessel bypass.   He has no drug allergies.   He drinks socially.  This evening he had one alcoholic beverage.  He does  not smoke.   Medications include:  1. Toprol XL 50 mg.  2. Altace 10 mg.  3. Omega-3 fatty acids.  4. Lipitor 80 mg.  5. Aspirin 325 mg.  6. Hydrochlorothiazide 12.5 mg every other day.   At this time other review of systems is negative as noted above.   PHYSICAL EXAMINATION:  GENERAL:  He appears uncomfortable, appears  nauseated.  Before the exam could commence he had vomiting x2 with chunky  meaty brown material.  VITAL SIGNS:  Temperature is 97.3, respiratory rate 22, pulse 56, and blood  pressure 143/88.  HEENT:  Fundal exam reveals arteriolar narrowing.  The __________ exam is  unremarkable.  Dental hygiene is excellent.  LYMPHATIC:  He  has no lymphadenopathy about the head, neck, or axilla.  CHEST:  Clear.  CARDIOVASCULAR  He has an S4.  All pulses are intact, and there are no  bruits.  ABDOMEN:  Bowel sounds are decreased.  He is tender in both upper quadrants,  greater on the left than the right.  GENITAL, RECTAL:  Exams are initially deferred.  NEUROLOGIC:  There are no localizing neurologic deficits.   He will be admitted with IV medications for pain and nausea control.  It is  unclear whether this is related to the mussels he ingested or cholecystitis.  He has coronary artery disease.  He will receive IV Phenergan and Demerol.  Enzymes will be collected.  Aspirin will initially be held because of the  nausea and vomiting .  Titus Dubin. Alwyn Ren, M.D. [facility]    WFH/MEDQ  D:  05/12/2004  T:  05/12/2004  Job:  [id number]

## 2011-03-02 NOTE — Op Note (Signed)
NAMELamonte, [name]                  ACCOUNT NO.:  [id number]   MEDICAL RECORD NO.:  [id number]          PATIENT TYPE:  OIB   LOCATION:  2899                         FACILITY:  [facility]   PHYSICIAN:  Sandria Bales. Ezzard Standing, M.D.  DATE OF BIRTH:  1941/01/18   DATE OF PROCEDURE:  08/17/2004  DATE OF DISCHARGE:                                 OPERATIVE REPORT   PREOPERATIVE DIAGNOSIS:  Cholelithiasis.   POSTOPERATIVE DIAGNOSIS:  Chronic cholecystitis, cholelithiasis.   OPERATION PERFORMED:  Laparoscopic cholecystectomy with intraoperative  cholangiogram.   SURGEON:  Sandria Bales. Ezzard Standing, M.D.   ASSISTANT:  Angelia Mould. Derrell Lolling, M.D.   ANESTHESIA:  General endotracheal.   ESTIMATED BLOOD LOSS:  Minimal.   INDICATIONS FOR PROCEDURE:  [name] is a 70 year old white male who had a  CT scan done during the summer and was found to have gallstones.  He now  comes for attempted laparoscopic cholecystectomy.  The indications and  potential complications were explained to the patient.  Potential  complications include but are not limited to bleeding, infection, bile duct  injury and open surgery.   DESCRIPTION OF PROCEDURE:  The patient was placed in supine position and  given a general endotracheal anesthetic supervised by Burna Forts,  M.D.  His upper abdomen was shaved, prepped with Betadine solution and  sterilely draped.  An infraumbilical incision was made with sharp dissection  carried down to the abdominal cavity at 0 degree.  A 10 mm laparoscope was  inserted through a 12 mm [employer] Hasson trocar.  The Hasson trocar  was secured with a 0 Vicryl suture.  Laparoscopic exploration revealed right  and left lobes of the liver unremarkable, anterior wall of the stomach  unremarkable.  The patient had an appendectomy for appendicitis in the  summer and there were no residual adhesions that I could see.  Three  additional trocars were placed, a 10 mm [employer] trocar in the  subxiphoid location and then the 5 mm [employer] in the right  midsubcostal and a 5 mm [employer] in the lateral subcostal location.   During the procedure, I did switch to a 30 degree angled scope.  The  gallbladder was grasped, rotated cephalad, dissection carried out along the  cystic duct, gallbladder junction and at least two branches, the anterior  and posterior branch of the cystic artery were identified and clipped.  A  clip was placed on the gallbladder side of the cystic duct.   An intraoperative cholangiogram was obtained.  The intraoperative  cholangiogram was obtained using a cut off taut catheter inserted through a  14 gauge Jelco catheter and then inserted into the side of the cut cystic  duct.  The cholangiogram showed free flow of contrast down the cystic duct  into the common bile duct into the duodenum and up the hepatic radicals.  There was no filling defect and no mass.  This looked to be a normal  intraoperative cholangiogram.  Unfortunately when they were shooting the  cholangiogram they did not save the runs.  All they had was  the very last  picture, which does show the common bile duct emptying into the duodenum.  The taut catheter was then removed.  The cystic duct triply endoclipped and  divided.  The gallbladder was then sharply and bluntly dissected from the  gallbladder bed.  There was about one third of the way up the gallbladder  bed, a posterior branch of the cystic artery which seemed to come almost out  of the liver bed.  I had a drive a clip into the liver bed to control this,  but I did control the bleeding.  The triangle of Calot was visualized and  there was no bleeding or bile leak from there.  The gallbladder bed was  visualized.  There was no bleeding or bile leak from there.  The gallbladder  was then divided, delivered through the umbilicus intact.  The gallbladder  was then sent to pathology.   Each port was then infiltrated with  0.25% Marcaine.  The umbilical port  closed with 0 Vicryl suture, the skin at each port was closed with a 5-0  Vicryl suture, painted with tincture of benzoin and steri-stripped.  The  patient tolerated the procedure well.  He plans to try to go home today if  he does well enough and he will have discharge instructions and see me back  in two weeks.      Davi   DHN/MEDQ  D:  08/17/2004  T:  08/17/2004  Job:  [id number]   cc:   Titus Dubin. Alwyn Ren, M.D. [facility]

## 2011-05-24 ENCOUNTER — Telehealth: Payer: Self-pay | Admitting: Internal Medicine

## 2011-05-24 NOTE — Telephone Encounter (Signed)
Left msg for pt to return call.

## 2011-05-28 ENCOUNTER — Ambulatory Visit (INDEPENDENT_AMBULATORY_CARE_PROVIDER_SITE_OTHER): Payer: Medicare Other | Admitting: *Deleted

## 2011-05-28 DIAGNOSIS — Z23 Encounter for immunization: Secondary | ICD-10-CM

## 2011-05-28 NOTE — Telephone Encounter (Signed)
Spoke w/ pt appt scheduled to have shot today

## 2011-07-09 ENCOUNTER — Encounter: Payer: Self-pay | Admitting: Cardiovascular Disease

## 2011-07-10 ENCOUNTER — Ambulatory Visit (INDEPENDENT_AMBULATORY_CARE_PROVIDER_SITE_OTHER): Payer: Medicare Other | Admitting: Cardiovascular Disease

## 2011-07-10 ENCOUNTER — Encounter: Payer: Self-pay | Admitting: Cardiovascular Disease

## 2011-07-10 VITALS — BP 157/86 | HR 59 | Ht 72.0 in | Wt 255.0 lb

## 2011-07-10 DIAGNOSIS — E782 Mixed hyperlipidemia: Secondary | ICD-10-CM

## 2011-07-10 DIAGNOSIS — I2510 Atherosclerotic heart disease of native coronary artery without angina pectoris: Secondary | ICD-10-CM

## 2011-07-10 DIAGNOSIS — I10 Essential (primary) hypertension: Secondary | ICD-10-CM

## 2011-07-10 NOTE — Patient Instructions (Signed)
Your physician has requested that you regularly monitor and record your blood pressure readings at home. Please use the same machine at the same time of day to check your readings and record them to bring to your follow-up visit.  Please call our office if your pressure is consistently above 140/90.   Your physician wants you to follow-up in: 1 YEAR.  You will receive a reminder letter in the mail two months in advance. If you don't receive a letter, please call our office to schedule the follow-up appointment.

## 2011-07-11 ENCOUNTER — Encounter: Payer: Self-pay | Admitting: Cardiovascular Disease

## 2011-07-11 NOTE — Progress Notes (Signed)
HPI:  This is a 70 year old gentleman presenting for followup evaluation. The patient has been followed by Dr. Juanda Chance for many years.  He initially presented in 2000 with an inferior wall MI. He was treated with a bare-metal stent to the right coronary artery. He developed in-stent restenosis discovered by an abnormal stress test in 2001. He was treated with PTCA and has had no recurrent ischemic problem since that time.  The patient has been inconsistent with exercise. He denies exertional chest pain or pressure. He's had no dyspnea, edema, palpitations, lightheadedness, or syncope. His blood pressure is elevated today and he just returned from 2 weeks of vacation and feels this has contributed.  Outpatient Encounter Prescriptions as of 07/10/2011  Medication Sig Dispense Refill  . aspirin 81 MG tablet Take 81 mg by mouth daily.        Marland Kitchen ibuprofen (ADVIL,MOTRIN) 200 MG tablet Three 200mg  tabs A DAY      . metoprolol tartrate (LOPRESSOR) 25 MG tablet Take 25 mg by mouth. Take 1/2 tablet  twice a day       . Multiple Vitamin (MULTIVITAMIN) tablet Take 1 tablet by mouth daily.        . ramipril (ALTACE) 10 MG capsule TAKE 1 CAPSULE BY MOUTH EVERY DAY  30 capsule  5  . rosuvastatin (CRESTOR) 40 MG tablet Take 40 mg by mouth daily.        Marland Kitchen DISCONTD: Coenzyme Q10 (COQ10) 200 MG CAPS Take 1 capsule by mouth daily.          No Known Allergies  Past Medical History  Diagnosis Date  . 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 Other abnormal glucose   . Coronary artery disease   . Hyperlipidemia   . Hypertension   . Lichen planus     oral    ROS: Negative except as per HPI  BP 157/86  Pulse 59  Ht 6' (1.829 m)  Wt 255 lb (115.667 kg)  BMI 34.58 kg/m2  PHYSICAL EXAM: Pt is alert and oriented, overweight male in NAD HEENT: normal Neck: JVP - normal, carotids 2+= without bruits Lungs: CTA bilaterally CV: RRR without murmur or gallop Abd: soft, NT, Positive  BS, no hepatomegaly Ext: no C/C/E, distal pulses intact and equal Skin: warm/dry no rash  EKG:  Normal sinus rhythm with first degree AV block, heart rate 59 beats per minute, inferior infarct age undetermined.  ASSESSMENT AND PLAN:

## 2011-07-11 NOTE — Assessment & Plan Note (Signed)
Lipids within the last year showed an LDL of 87. He will continue on high dose Crestor.

## 2011-07-11 NOTE — Assessment & Plan Note (Signed)
The patient is stable without angina. He continues on antiplatelet therapy with aspirin. He is on a beta blocker and ACE inhibitor as well as a statin drug. We'll continue his current medical program. He was counseled regarding the importance of diet, exercise, and weight loss.

## 2011-07-11 NOTE — Assessment & Plan Note (Signed)
Blood pressure is elevated today.  Review of prior office notes shows good readings with his visit last year (blood pressure 122/70) you. Will follow for now with focus on lifestyle modification.

## 2011-07-13 LAB — BASIC METABOLIC PANEL
BUN: 15
CO2: 24
CO2: 28
Calcium: 9.6
Chloride: 106
Creatinine, Ser: 1
Creatinine, Ser: 1
GFR calc Af Amer: >60
GFR calc Af Amer: >60
Glucose, Bld: 152 — ABNORMAL HIGH
Sodium: 140

## 2011-07-13 LAB — APTT: aPTT: 24

## 2011-07-13 LAB — URINALYSIS, ROUTINE W REFLEX MICROSCOPIC
Hgb urine dipstick: NEGATIVE
Nitrite: NEGATIVE
Protein, ur: NEGATIVE
Urobilinogen, UA: 0.2

## 2011-07-13 LAB — PROTIME-INR: INR: 1

## 2011-07-13 LAB — DIFFERENTIAL
Eosinophils Absolute: 0.1
Lymphocytes Relative: 17
Lymphs Abs: 1.3
Monocytes Relative: 8
Neutrophils Relative %: 73

## 2011-07-13 LAB — CBC
HCT: 48.2
Hemoglobin: 13.9
MCHC: 33.7
MCV: 96.2
Platelets: 173
RBC: 4.3
RBC: 5.07
WBC: 7.6

## 2011-07-13 LAB — TYPE AND SCREEN: ABO/RH(D): O POS

## 2011-09-04 ENCOUNTER — Telehealth: Payer: Self-pay | Admitting: *Deleted

## 2011-09-04 NOTE — Telephone Encounter (Signed)
Pt left VM that he spoke with Dr Alwyn Ren this weekend and his cold is not better, so he would like to get a antibiotic Rx. Please advise

## 2011-09-05 ENCOUNTER — Ambulatory Visit (INDEPENDENT_AMBULATORY_CARE_PROVIDER_SITE_OTHER): Payer: Medicare Other | Admitting: Internal Medicine

## 2011-09-05 ENCOUNTER — Encounter: Payer: Self-pay | Admitting: Internal Medicine

## 2011-09-05 DIAGNOSIS — J0100 Acute maxillary sinusitis, unspecified: Secondary | ICD-10-CM

## 2011-09-05 DIAGNOSIS — J209 Acute bronchitis, unspecified: Secondary | ICD-10-CM

## 2011-09-05 MED ORDER — CEFUROXIME AXETIL 500 MG PO TABS: 500.0000 mg | ORAL_TABLET | Freq: Two times a day (BID) | ORAL | Status: AC

## 2011-09-05 MED ORDER — HYDROCODONE-HOMATROPINE 5-1.5 MG/5ML PO SYRP: 5.0000 mL | ORAL_SOLUTION | Freq: Four times a day (QID) | ORAL | Status: AC | PRN

## 2011-09-05 NOTE — Progress Notes (Signed)
  Subjective:    Patient ID: [name], male    DOB: 29-Mar-1941, 70 y.o.   MRN: [id number]  HPI Respiratory tract infection Onset/symptoms:11/15 as head congestion  Exposures (illness/environmental/extrinsic):ill patients Progression of symptoms:to chest congestion with sputum Treatments/response:Zpack Present symptoms: Fever/chills/sweats:no Frontal headache:no Facial pain:yes Nasal purulence:yes Sore throat:minimal Dental pain:no Lymphadenopathy:no Wheezing/shortness of breath:no Cough/sputum/hemoptysis:green sputum, > volume from chest than head Pleuritic pain:no Associated extrinsic/allergic symptoms:itchy eyes/ sneezing:no Past medical history: Seasonal allergies: no /asthma:no Smoking history:quit 1999           Review of Systems     Objective:   Physical Exam General appearance is of good health and nourishment; no acute distress or increased work of breathing is present.  No  lymphadenopathy about the head, neck, or axilla noted.   Eyes: No conjunctival inflammation or lid edema is present.   Ears:  External ear exam shows no significant lesions or deformities.  Otoscopic examination reveals clear canals, tympanic membranes are intact bilaterally without bulging, retraction, inflammation or discharge.  Nose:  External nasal examination shows no deformity or inflammation. Nasal mucosa are erythematous  without lesions or exudates. Septal deviation to R.No obstruction to airflow.   Oral exam: Dental hygiene is good; lips and gums are healthy appearing.There is no oropharyngeal erythema or exudate noted.     Heart:  Normal rate and regular rhythm. S1 and S2 normal without gallop, murmur, click, rub or other extra sounds.   Lungs:Chest clear to auscultation; no wheezes, rhonchi,rales ,or rubs present.No increased work of breathing.    Extremities:  No cyanosis, edema, or clubbing  noted    Skin: Warm & dry w/o jaundice or tenting.          Assessment  & Plan:  #1 bronchitis, purulent. Status post 5 days of azithromycin with suboptimal response.  #2 clinical history suggestive of rhinosinusitis, maxillary  Plan: See orders and recommendations

## 2011-09-05 NOTE — Patient Instructions (Signed)
Plain Mucinex for thick secretions ;force NON dairy fluids. Use a Neti pot daily as needed for sinus congestion  

## 2011-09-05 NOTE — Telephone Encounter (Signed)
Per Dr.Hopper, patient to be seen.   Spoke with patient, patient to be seen today at 2:00 pm

## 2011-09-14 ENCOUNTER — Telehealth: Payer: Self-pay

## 2011-09-14 MED ORDER — METOPROLOL TARTRATE 25 MG PO TABS: 25.0000 mg | ORAL_TABLET | Freq: Two times a day (BID) | ORAL | Status: DC

## 2011-09-14 NOTE — Progress Notes (Signed)
Patient ID: [name], male   DOB: 1940-11-25, 70 y.o.   MRN: [id number] The patient brought in his home blood pressure readings for review. He has frequent systolic blood pressures greater than 140. Diastolic blood pressures are in the 80s. I recommended that he increase his metoprolol to 25 mg twice daily. He will continue with a blood pressure log and will followup here in about 3 months for a followup office visit.

## 2011-09-14 NOTE — Telephone Encounter (Signed)
I spoke with the pt and scheduled him to see Dr Excell Seltzer 11/28/11 at 8:45.  The pt will increase Metoprolol Tartrate to 25mg  take one by mouth twice a day. Rx sent to pharmacy.

## 2011-09-19 ENCOUNTER — Other Ambulatory Visit: Payer: Self-pay | Admitting: Internal Medicine

## 2011-09-19 NOTE — Telephone Encounter (Signed)
rx sent to pharmacy by e-script  

## 2011-10-12 ENCOUNTER — Other Ambulatory Visit: Payer: Self-pay

## 2011-10-12 MED ORDER — ROSUVASTATIN CALCIUM 40 MG PO TABS: 40.0000 mg | ORAL_TABLET | Freq: Every day | ORAL | Status: DC

## 2011-10-12 NOTE — Telephone Encounter (Signed)
..   Requested Prescriptions   Signed Prescriptions Disp Refills  . rosuvastatin (CRESTOR) 40 MG tablet 30 tablet 11    Sig: Take 1 tablet (40 mg total) by mouth daily.    Authorizing Provider: Tonny Bollman D    Ordering User: Lacie Scotts

## 2011-11-01 ENCOUNTER — Telehealth: Payer: Self-pay | Admitting: Cardiovascular Disease

## 2011-11-01 DIAGNOSIS — I10 Essential (primary) hypertension: Secondary | ICD-10-CM

## 2011-11-01 MED ORDER — HYDROCHLOROTHIAZIDE 25 MG PO TABS: 25.0000 mg | ORAL_TABLET | Freq: Every day | ORAL | Status: DC

## 2011-11-01 NOTE — Telephone Encounter (Signed)
Reviewed patient's blood pressure logs. His average blood pressure is 145/84. Recommended addition of hydrochlorothiazide 25 mg daily. Will check a metabolic panel in about 2 weeks. He will record blood pressures at home and bring his readings and to his office visit next month.

## 2011-11-16 ENCOUNTER — Other Ambulatory Visit (INDEPENDENT_AMBULATORY_CARE_PROVIDER_SITE_OTHER): Payer: Medicare Other | Admitting: *Deleted

## 2011-11-16 DIAGNOSIS — I10 Essential (primary) hypertension: Secondary | ICD-10-CM

## 2011-11-16 LAB — BASIC METABOLIC PANEL
BUN: 24 mg/dL — ABNORMAL HIGH (ref 6–23)
Calcium: 9.1 mg/dL (ref 8.4–10.5)
GFR: 72.22 mL/min (ref >60.00)
Glucose, Bld: 124 mg/dL — ABNORMAL HIGH (ref 70–99)

## 2011-11-28 ENCOUNTER — Ambulatory Visit: Payer: Medicare Other | Admitting: Cardiovascular Disease

## 2011-12-03 ENCOUNTER — Encounter: Payer: Self-pay | Admitting: Cardiovascular Disease

## 2011-12-03 ENCOUNTER — Ambulatory Visit (INDEPENDENT_AMBULATORY_CARE_PROVIDER_SITE_OTHER): Payer: Medicare Other | Admitting: Cardiovascular Disease

## 2011-12-03 VITALS — BP 142/88 | HR 67 | Ht 72.0 in | Wt 264.0 lb

## 2011-12-03 DIAGNOSIS — I10 Essential (primary) hypertension: Secondary | ICD-10-CM

## 2011-12-03 DIAGNOSIS — I2510 Atherosclerotic heart disease of native coronary artery without angina pectoris: Secondary | ICD-10-CM

## 2011-12-03 DIAGNOSIS — E782 Mixed hyperlipidemia: Secondary | ICD-10-CM

## 2011-12-03 NOTE — Assessment & Plan Note (Signed)
The patient's lipids have been followed by Dr. Alwyn Ren. He is on a statin drug with Crestor 40 mg. He is considering enrollment in the Accelerate Trial which evaluates HDL raising therapy in a randomized fashion.

## 2011-12-03 NOTE — Patient Instructions (Signed)
Your physician has requested that you have an exercise tolerance test in 6 months with Dr Excell Seltzer. For further information please visit [email or web address] Please also follow instruction sheet, as given.  The current medical regimen is effective;  continue present plan and medications.

## 2011-12-03 NOTE — Progress Notes (Signed)
HPI:  71 year old gentleman presenting for followup evaluation. The patient has coronary artery disease with history of an inferior wall MI in 2000. His last PCI procedure was in 2001 when he underwent balloon angioplasty for treatment of in-stent restenosis in the right coronary artery.  The patient is also followed for hypertension. He called in with home blood pressure readings and they were consistently elevated with systolics in the 140s. He was started on hydrochlorothiazide and  A followup metabolic panel was within normal limits.  The patient is doing well at present. He continues to struggle with his weight. He exercises sporadically and has no exertional symptoms. He specifically denies chest pain or pressure. He denies dyspnea, edema, orthopnea, or PND.  Outpatient Encounter Prescriptions as of 12/03/2011  Medication Sig Dispense Refill  . aspirin 81 MG tablet Take 81 mg by mouth daily.        Marland Kitchen co-enzyme Q-10 30 MG capsule Take 100 mg by mouth as needed.      . hydrochlorothiazide (HYDRODIURIL) 25 MG tablet Take 1 tablet (25 mg total) by mouth daily.  30 tablet  11  . ibuprofen (ADVIL,MOTRIN) 200 MG tablet Take 600 mg by mouth daily. Three 200mg  tabs A DAY      . metoprolol tartrate (LOPRESSOR) 25 MG tablet Take 1 tablet (25 mg total) by mouth 2 (two) times daily.  60 tablet  11  . Multiple Vitamin (MULTIVITAMIN) tablet Take 1 tablet by mouth daily.        . ramipril (ALTACE) 10 MG capsule TAKE 1 CAPSULE BY MOUTH EVERY DAY  30 capsule  5  . rosuvastatin (CRESTOR) 40 MG tablet Take 1 tablet (40 mg total) by mouth daily.  30 tablet  11    No Known Allergies  Past Medical History  Diagnosis Date  . 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 Other abnormal glucose   . Coronary artery disease   . Hyperlipidemia   . Hypertension   . Lichen planus     oral    ROS: Negative except as per HPI  BP 142/88  Pulse 67  Ht 6' (1.829 m)  Wt 119.75 kg (264  lb)  BMI 35.80 kg/m2  PHYSICAL EXAM: Pt is alert and oriented, very pleasant overweight male in NAD HEENT: normal Neck: JVP - normal, carotids 2+= without bruits Lungs: CTA bilaterally CV: RRR without murmur or gallop Abd: soft, NT, Positive BS, no hepatomegaly Ext: no C/C/E, distal pulses intact and equal Skin: warm/dry no rash  EKG:  Normal sinus rhythm 67 beats per minute, first degree AV block, age-indeterminate inferoposterior infarct.  ASSESSMENT AND PLAN:

## 2011-12-03 NOTE — Assessment & Plan Note (Signed)
Blood pressure control is improved. The patient brings in home readings and these were reviewed. Since he has started hydrochlorothiazide, his systolics are in the 130s and diastolics are in the range of 80-85. Continue same meds.

## 2011-12-03 NOTE — Assessment & Plan Note (Signed)
The patient is stable without anginal symptoms. He is on a good medical program. Recommend an exercise treadmill study when he returns for followup in 6 months. He will continue on aspirin for antiplatelet therapy, a statin drug, and his current antihypertensive regimen.

## 2012-02-25 ENCOUNTER — Ambulatory Visit (INDEPENDENT_AMBULATORY_CARE_PROVIDER_SITE_OTHER): Payer: Medicare Other | Admitting: Family Medicine

## 2012-02-25 ENCOUNTER — Encounter: Payer: Self-pay | Admitting: Family Medicine

## 2012-02-25 VITALS — BP 128/80 | HR 63 | Temp 98.5°F | Ht 71.25 in | Wt 257.6 lb

## 2012-02-25 DIAGNOSIS — J209 Acute bronchitis, unspecified: Secondary | ICD-10-CM | POA: Insufficient documentation

## 2012-02-25 MED ORDER — BENZONATATE 200 MG PO CAPS: 200.0000 mg | ORAL_CAPSULE | Freq: Three times a day (TID) | ORAL | Status: AC | PRN

## 2012-02-25 MED ORDER — AZITHROMYCIN 250 MG PO TABS: ORAL_TABLET | ORAL | Status: AC

## 2012-02-25 NOTE — Progress Notes (Signed)
  Subjective:    Patient ID: [name], male    DOB: [birth year], 71 y.o.   MRN: [id number]  HPI URI- pt initially felt sxs were allergy related.  Started 4-5 days ago.  Cough is worsening and now productive of yellow sputum.  Sore throat this AM.  No fever.  Denies nasal congestion/sinus pressure.  R ear pain.  No known sick contacts.   Review of Systems For ROS see HPI     Objective:   Physical Exam  Vitals reviewed. Constitutional: He appears well-developed and well-nourished. No distress.  HENT:  Head: Normocephalic and atraumatic.  Right Ear: Tympanic membrane normal.  Left Ear: Tympanic membrane normal.  Nose: No mucosal edema or rhinorrhea. Right sinus exhibits no maxillary sinus tenderness and no frontal sinus tenderness. Left sinus exhibits no maxillary sinus tenderness and no frontal sinus tenderness.  Mouth/Throat: Mucous membranes are normal. No oropharyngeal exudate, posterior oropharyngeal edema or posterior oropharyngeal erythema.  Eyes: Conjunctivae and EOM are normal. Pupils are equal, round, and reactive to light.  Neck: Normal range of motion. Neck supple.  Cardiovascular: Normal rate, regular rhythm and normal heart sounds.   Pulmonary/Chest: Effort normal and breath sounds normal. No respiratory distress. He has no wheezes.       + hacking cough  Lymphadenopathy:    He has no cervical adenopathy.  Skin: Skin is warm and dry.          Assessment & Plan:

## 2012-02-25 NOTE — Assessment & Plan Note (Signed)
New.  Start zpack.  Cough meds prn.  Reviewed supportive care and red flags that should prompt return.  Pt expressed understanding and is in agreement w/ plan.  

## 2012-02-25 NOTE — Patient Instructions (Signed)
Start the Zpack for bronchitis Use the cough pills as needed Mucinex to thin your chest congestion Start Claritin or Zyrtec daily for the allergy component Drink plenty of fluids REST! Hang in there!!!

## 2012-05-07 ENCOUNTER — Other Ambulatory Visit: Payer: Self-pay | Admitting: Internal Medicine

## 2012-07-07 ENCOUNTER — Ambulatory Visit (INDEPENDENT_AMBULATORY_CARE_PROVIDER_SITE_OTHER): Payer: Medicare Other | Admitting: Cardiovascular Disease

## 2012-07-07 ENCOUNTER — Encounter: Payer: Self-pay | Admitting: Cardiovascular Disease

## 2012-07-07 DIAGNOSIS — Z79899 Other long term (current) drug therapy: Secondary | ICD-10-CM

## 2012-07-07 DIAGNOSIS — I2510 Atherosclerotic heart disease of native coronary artery without angina pectoris: Secondary | ICD-10-CM

## 2012-07-07 MED ORDER — TADALAFIL 10 MG PO TABS: 10.0000 mg | ORAL_TABLET | Freq: Every day | ORAL | Status: DC | PRN

## 2012-07-07 NOTE — Addendum Note (Signed)
Addended by: Iona Coach on: 07/07/2012 12:19 PM   Modules accepted: Orders

## 2012-07-07 NOTE — Procedures (Signed)
Exercise Treadmill Test  Pre-Exercise Testing Evaluation Rhythm: sinus bradycardia  Rate: 54   PR:  .26 QRS:  .09  QT:  .41 QTc: .39     Test  Exercise Tolerance Test Ordering MD: Tonny Bollman, MD  Interpreting MD: Tonny Bollman, MD  Unique Test No: 1  Treadmill:  1  Indication for ETT: known ASHD  Contraindication to ETT: No   Stress Modality: exercise - treadmill  Cardiac Imaging Performed: non   Protocol: standard Bruce - maximal  Max BP:  185/68  Max MPHR (bpm):  152 85% MPR (bpm):  129  MPHR obtained (bpm):  106 % MPHR obtained:  70  Reached 85% MPHR (min:sec):  n/a Total Exercise Time (min-sec):  9:00  Workload in METS:  10.1 Borg Scale: 11  Reason ETT Terminated:  dizziness    ST Segment Analysis At Rest: normal ST segments - no evidence of significant ST depression With Exercise: no evidence of significant ST depression  Other Information Arrhythmia:  No Angina during ETT:  absent (0) Quality of ETT:  non-diagnostic  ETT Interpretation:  normal - no evidence of ischemia by ST analysis  Comments: Fair exercise tolerance. No chest pain, arrhythmia, or significant ST changes with exertion at submaximal heart rate.   Recommendations: Graded exercise program. Continued lifestyle modification.

## 2012-07-08 LAB — BASIC METABOLIC PANEL
Calcium: 9.1 mg/dL (ref 8.4–10.5)
Creatinine, Ser: 1.1 mg/dL (ref 0.4–1.5)
GFR: 71.32 mL/min (ref >60.00)

## 2012-07-08 LAB — LIPID PANEL
HDL: 39.9 mg/dL (ref >39.00)
LDL Cholesterol: 59 mg/dL (ref 0–99)
Total CHOL/HDL Ratio: 3
Triglycerides: 69 mg/dL (ref 0.0–149.0)

## 2012-07-08 LAB — HEPATIC FUNCTION PANEL
Bilirubin, Direct: 0.2 mg/dL (ref 0.0–0.3)
Total Bilirubin: 1.2 mg/dL (ref 0.3–1.2)
Total Protein: 6.4 g/dL (ref 6.0–8.3)

## 2012-07-14 ENCOUNTER — Telehealth: Payer: Self-pay | Admitting: Cardiovascular Disease

## 2012-07-14 NOTE — Telephone Encounter (Signed)
Lab results mailed to pt per his request

## 2012-07-14 NOTE — Telephone Encounter (Signed)
New problem:  Patient calling  Asking can you Mail lab results .

## 2012-08-15 ENCOUNTER — Telehealth: Payer: Self-pay

## 2012-08-15 NOTE — Telephone Encounter (Signed)
I called patient and discussed Dr.Hopper's response, Dr.Hopper was standing in front of me at the time of call and also spoke with patient and recommended he remain on Crestor. RX was already sent by Dr.Cooper

## 2012-08-15 NOTE — Telephone Encounter (Signed)
Patient states he spoke with MD on Wed, and discussed cholesterol lowering medication. Patient would like to consider Lipitor vs Crestor (Rx'ed by Dr.Cooper). Patient would like 90 day supply sent to pharmacy on file.   Dr.Hopper please advise

## 2012-08-15 NOTE — Telephone Encounter (Signed)
Based on cholesterol results; present therapy should be continued which I believe is Crestor 40 mg daily. It is mandatory to keep the LDL below 70. #90 , R X 1

## 2012-10-03 ENCOUNTER — Encounter: Payer: Self-pay | Admitting: Internal Medicine

## 2012-11-06 ENCOUNTER — Ambulatory Visit (INDEPENDENT_AMBULATORY_CARE_PROVIDER_SITE_OTHER): Payer: Medicare Other | Admitting: Internal Medicine

## 2012-11-06 ENCOUNTER — Encounter: Payer: Self-pay | Admitting: Internal Medicine

## 2012-11-06 VITALS — BP 130/80 | HR 67 | Temp 98.0°F | Wt 258.0 lb

## 2012-11-06 DIAGNOSIS — J0110 Acute frontal sinusitis, unspecified: Secondary | ICD-10-CM

## 2012-11-06 MED ORDER — HYDROCODONE-HOMATROPINE 5-1.5 MG/5ML PO SYRP: 5.0000 mL | ORAL_SOLUTION | Freq: Four times a day (QID) | ORAL | Status: DC | PRN

## 2012-11-06 MED ORDER — CEFUROXIME AXETIL 500 MG PO TABS: 500.0000 mg | ORAL_TABLET | Freq: Two times a day (BID) | ORAL | Status: DC

## 2012-11-06 NOTE — Patient Instructions (Addendum)
Plain Mucinex (NOT D) for thick secretions ;force NON dairy fluids .   Nasal cleansing in the shower as discussed with lather of mild shampoo.After 10 seconds wash off lather while  exhaling through nostrils. Make sure that all residual soap is removed to prevent irritation.  Nasonex 1 spray in each nostril twice a day as needed. Use the "crossover" technique into opposite nostril spraying toward opposite ear @ 45 degree angle, not straight up into nostril.  Use a Neti pot daily only  as needed for significant sinus congestion; going from open side to congested side . Plain Allegra (NOT D )  160 daily , Loratidine 10 mg , OR Zyrtec 10 mg @ bedtime  as needed for itchy eyes & sneezing.    

## 2012-11-06 NOTE — Progress Notes (Signed)
  Subjective:    Patient ID: [name], male    DOB: 01/18/1941, 72 y.o.   MRN: [id number]  HPI The respiratory tract symptoms began  11/01/12 as sore throat ,head congestion , rhinitis, chest congestion ,cough with progression to green sputum.  Significant active  associated symptoms include frontal headache, sore throat, nasal purulence.   Cough is not associated with shortness of breath and wheezing .       Flu shot is current       Treatment with Mucinex,Benadryl and cough syrup was partially effective.  Leftover Cefuroxime   There is no history of asthma. The patient quit smoking cigars in 2000.                  Review of Systems Symptoms not present include facial pain,earache and otic discharge Fever,chills & sweats not present  Itchy , watery eyes & sneezing were not noted.     Objective:   Physical Exam General appearance:good health ;well nourished; no acute distress or increased work of breathing is present.  No  lymphadenopathy about the head, neck, or axilla noted.  Eyes: No conjunctival inflammation or lid edema is present. There is no scleral icterus. Ears:  External ear exam shows no significant lesions or deformities.  Otoscopic examination reveals clear canals, tympanic membranes are intact bilaterally without bulging, retraction, inflammation or discharge. Nose:  External nasal examination shows no deformity or inflammation. Nasal mucosa are pink and moist without lesions or exudates. Oblique septal  deviation.No obstruction to airflow.  Oral exam: Dental hygiene is good; lips and gums are healthy appearing.There is no oropharyngeal erythema or exudate noted.  Neck:  No deformities,  masses, or tenderness noted.   Heart:  Normal rate and regular rhythm. S1 and S2 normal without gallop, murmur, click, rub or other extra sounds.  Lungs:Chest clear to auscultation; no wheezes, rhonchi,rales ,or rubs present.No increased work of breathing.     Extremities:  No cyanosis, edema, or clubbing  noted  Skin: Warm & dry .         Assessment & Plan:  #1 rhinosinusitis without significant bronchitis  Plan: Nasal hygiene interventions discussed. See prescription medications

## 2012-11-11 ENCOUNTER — Other Ambulatory Visit: Payer: Self-pay | Admitting: Cardiovascular Disease

## 2012-11-12 ENCOUNTER — Other Ambulatory Visit: Payer: Self-pay | Admitting: Dermatology

## 2012-11-16 ENCOUNTER — Other Ambulatory Visit: Payer: Self-pay | Admitting: Internal Medicine

## 2012-11-17 ENCOUNTER — Other Ambulatory Visit: Payer: Self-pay | Admitting: Cardiovascular Disease

## 2012-11-17 NOTE — Telephone Encounter (Signed)
#  10 ,1 bid . Sinus or chest Xray  imaging indicated if symptoms persist after 2 weeks of antibiotics.

## 2012-11-17 NOTE — Telephone Encounter (Signed)
Last OV 1 /23/2014, Hopp please advise on refill request for ABX

## 2012-11-20 ENCOUNTER — Encounter: Payer: Self-pay | Admitting: Internal Medicine

## 2012-11-20 ENCOUNTER — Ambulatory Visit (INDEPENDENT_AMBULATORY_CARE_PROVIDER_SITE_OTHER)
Admission: RE | Admit: 2012-11-20 | Discharge: 2012-11-20 | Disposition: A | Discharge status: Home / Self Care | Payer: Medicare Other | Source: Ambulatory Visit | Attending: Internal Medicine | Admitting: Internal Medicine

## 2012-11-20 ENCOUNTER — Ambulatory Visit (INDEPENDENT_AMBULATORY_CARE_PROVIDER_SITE_OTHER): Payer: Medicare Other | Admitting: Internal Medicine

## 2012-11-20 VITALS — BP 142/80 | HR 78 | Temp 98.1°F | Wt 258.0 lb

## 2012-11-20 DIAGNOSIS — R5383 Other fatigue: Secondary | ICD-10-CM

## 2012-11-20 DIAGNOSIS — J209 Acute bronchitis, unspecified: Secondary | ICD-10-CM

## 2012-11-20 DIAGNOSIS — R5381 Other malaise: Secondary | ICD-10-CM

## 2012-11-20 NOTE — Progress Notes (Signed)
  Subjective:    Patient ID: [name], male    DOB: 06/05/1941, 72 y.o.   MRN: [id number]  HPI  The prior office visit notes were reviewed; he verifies that that history is correct.  He completed 10 days of cefuroxime; it was renewed for additional 5 days because of some persistent symptoms. He continues to have a cough in the context of postnasal drainage.  He believes that he is having less chest congestion. He denies associated shortness of breath or wheezing. He denies any pleuritic type symptoms.  He is having significant fatigue.    Review of Systems Despite the postnasal drip; he denies frontal headache, facial pain, dental pain, or nasal purulence of significance.     Objective:   Physical Exam  General appearance:good health ;well nourished; no acute distress or increased work of breathing is present.  No  lymphadenopathy about the head, neck, or axilla noted.   Eyes: No conjunctival inflammation or lid edema is present.   Ears:  External ear exam shows no significant lesions or deformities.  Otoscopic examination reveals clear canals, tympanic membranes are intact bilaterally without bulging, retraction, inflammation or discharge.  Nose:  External nasal examination shows no deformity or inflammation. Nasal mucosa are erythematous without lesions or exudates. Rightward septal  Deviation. Slight obstruction to airflow.   Oral exam: Dental hygiene is good; lips and gums are healthy appearing.There is no oropharyngeal erythema or exudate noted.   Neck:  No deformities,  masses, or tenderness noted.    Heart:  Normal rate and regular rhythm. S1 and S2 normal without gallop, murmur, click, rub or other extra sounds.   Lungs: He has minimal rales at the bases without significant wheezing, rhonchi, or rubs. No increased work of breathing.    Extremities:  No cyanosis, edema, or clubbing  noted    Skin: Warm & dry          Assessment & Plan:

## 2012-11-20 NOTE — Patient Instructions (Addendum)
Order for x-rays entered into  the computer; these will be performed at [address] across from [facility]. No appointment is necessary.  Advair sample one inhalation every 12 hours; gargle and spit after use

## 2012-11-21 LAB — CBC WITH DIFFERENTIAL/PLATELET
Basophils Absolute: 0 10*3/uL (ref 0.0–0.1)
Eosinophils Absolute: 0.2 10*3/uL (ref 0.0–0.7)
Hemoglobin: 16.6 g/dL (ref 13.0–17.0)
Lymphocytes Relative: 15.1 % (ref 12.0–46.0)
MCHC: 34.6 g/dL (ref 30.0–36.0)
Neutro Abs: 7.6 10*3/uL (ref 1.4–7.7)
RDW: 12.7 % (ref 11.5–14.6)

## 2012-11-29 ENCOUNTER — Other Ambulatory Visit: Payer: Self-pay

## 2012-12-08 ENCOUNTER — Other Ambulatory Visit: Payer: Self-pay | Admitting: Cardiovascular Disease

## 2012-12-09 ENCOUNTER — Other Ambulatory Visit: Payer: Self-pay | Admitting: *Deleted

## 2012-12-09 MED ORDER — HYDROCHLOROTHIAZIDE 25 MG PO TABS: 25.0000 mg | ORAL_TABLET | Freq: Every day | ORAL | Status: DC

## 2012-12-12 ENCOUNTER — Encounter: Payer: Self-pay | Admitting: Internal Medicine

## 2012-12-31 ENCOUNTER — Ambulatory Visit (INDEPENDENT_AMBULATORY_CARE_PROVIDER_SITE_OTHER): Payer: Medicare Other | Admitting: Internal Medicine

## 2012-12-31 ENCOUNTER — Encounter: Payer: Self-pay | Admitting: Internal Medicine

## 2012-12-31 VITALS — BP 118/80 | HR 64 | Temp 98.4°F | Wt 254.4 lb

## 2012-12-31 DIAGNOSIS — I10 Essential (primary) hypertension: Secondary | ICD-10-CM

## 2012-12-31 DIAGNOSIS — T44905A Adverse effect of unspecified drugs primarily affecting the autonomic nervous system, initial encounter: Secondary | ICD-10-CM

## 2012-12-31 DIAGNOSIS — R059 Cough, unspecified: Secondary | ICD-10-CM

## 2012-12-31 DIAGNOSIS — R058 Other specified cough: Secondary | ICD-10-CM

## 2012-12-31 DIAGNOSIS — R05 Cough: Secondary | ICD-10-CM

## 2012-12-31 MED ORDER — LOSARTAN POTASSIUM 100 MG PO TABS: ORAL_TABLET | ORAL | Status: DC

## 2012-12-31 MED ORDER — FLUTICASONE PROPIONATE 50 MCG/ACT NA SUSP: 1.0000 | Freq: Two times a day (BID) | NASAL | Status: DC | PRN

## 2012-12-31 NOTE — Patient Instructions (Addendum)
Plain Mucinex (NOT D) for thick secretions ;force NON dairy fluids .   Nasal cleansing in the shower as discussed with lather of mild shampoo.After 10 seconds wash off lather while  exhaling through nostrils. Make sure that all residual soap is removed to prevent irritation.  Fluticasone 1 spray in each nostril twice a day as needed. Use the "crossover" technique into opposite nostril spraying toward opposite ear @ 45 degree angle, not straight up into nostril.  Use a Neti pot daily only  as needed for significant sinus congestion; going from open side to congested side . Plain Allegra (NOT D )  160 daily , Loratidine 10 mg , OR Zyrtec 10 mg @ bedtime  as needed for itchy eyes & sneezing.  Minimal Blood Pressure Goal= AVERAGE < 140/90;  Ideal is an AVERAGE < 135/85. This AVERAGE should be calculated from @ least 5-7 BP readings taken @ different times of day on different days of week. You should not respond to isolated BP readings , but rather the AVERAGE for that week .Please bring your  blood pressure cuff to office visits to verify that it is reliable.It  can also be checked against the blood pressure device at the pharmacy.

## 2012-12-31 NOTE — Progress Notes (Signed)
  Subjective:    Patient ID: [name], male    DOB: 03-13-1941, 72 y.o.   MRN: [id number]  HPI  He continues to have some dry cough; significantly he has been on ramipril.  He denies any significant sputum production.  In the morning he will have scant yellow nasal discharge. Subsequently he has his rhinitis through the day w/o extrinsic symptoms except isolated sneezing.  He also denies dyspnea or wheezing. Only today he does describe some fatigue. He's had some ear pressure without discharge     Review of Systems  He specifically denies, fever, chills, sweats, or significant nasal purulence. There is no significant frontal headache or facial pain.  He is not having any significant itchy, watery eyes.  He denies significant reflux symptoms; specifically he denies dyspepsia or dysphagia.         Objective:   Physical Exam  General appearance:good health ;well nourished; no acute distress or increased work of breathing is present.  No  lymphadenopathy about the head, neck, or axilla noted.   Eyes: No conjunctival inflammation or lid edema is present.   Ears:  External ear exam shows no significant lesions or deformities.  Otoscopic examination reveals clear canals, tympanic membranes are intact bilaterally without bulging, retraction, inflammation or discharge.  Nose:  External nasal examination shows no deformity or inflammation. Nasal mucosa are dry & erythematous  without lesions or exudates. R septal  deviation.No obstruction to airflow.   Oral exam: Dental hygiene is good; lips and gums are healthy appearing.There is no oropharyngeal erythema or exudate noted.   Neck:  No deformities,  masses, or tenderness noted.     Heart:  Normal rate and regular rhythm. S1 and S2 normal without gallop, murmur, click, rub or other extra sounds.   Lungs:Chest clear to auscultation; no wheezes, rhonchi,rales ,or rubs present.No increased work of breathing.    Extremities:  No  cyanosis, edema, or clubbing  noted    Skin: Warm & dry .          Assessment & Plan:  #1 cough; ramipril is most likely playing a role in the ongoing cough.  #2 rhinitis without significant extrinsic symptoms  Plan: See orders and recommendations

## 2013-01-05 ENCOUNTER — Encounter: Payer: Self-pay | Admitting: Internal Medicine

## 2013-01-13 ENCOUNTER — Other Ambulatory Visit: Payer: Self-pay | Admitting: Cardiovascular Disease

## 2013-01-25 ENCOUNTER — Other Ambulatory Visit: Payer: Self-pay | Admitting: Cardiovascular Disease

## 2013-03-03 ENCOUNTER — Other Ambulatory Visit: Payer: Self-pay | Admitting: Cardiovascular Disease

## 2013-03-03 ENCOUNTER — Other Ambulatory Visit: Payer: Self-pay | Admitting: Internal Medicine

## 2013-05-05 ENCOUNTER — Other Ambulatory Visit: Payer: Self-pay | Admitting: Cardiovascular Disease

## 2013-06-28 ENCOUNTER — Other Ambulatory Visit: Payer: Self-pay | Admitting: Cardiovascular Disease

## 2013-07-08 ENCOUNTER — Encounter: Payer: Self-pay | Admitting: Internal Medicine

## 2013-07-13 ENCOUNTER — Other Ambulatory Visit: Payer: Self-pay | Admitting: Cardiovascular Disease

## 2013-08-05 ENCOUNTER — Encounter: Payer: Self-pay | Admitting: Internal Medicine

## 2013-08-20 ENCOUNTER — Other Ambulatory Visit: Payer: Self-pay

## 2013-08-22 ENCOUNTER — Encounter: Payer: Self-pay | Admitting: Internal Medicine

## 2013-08-24 NOTE — Telephone Encounter (Signed)
FYI

## 2013-08-25 ENCOUNTER — Other Ambulatory Visit: Payer: Self-pay | Admitting: Cardiovascular Disease

## 2013-09-14 ENCOUNTER — Other Ambulatory Visit: Payer: Self-pay | Admitting: Cardiovascular Disease

## 2013-09-14 ENCOUNTER — Other Ambulatory Visit: Payer: Self-pay | Admitting: Internal Medicine

## 2013-09-14 NOTE — Telephone Encounter (Signed)
Losartan refilled per protocol. OV due

## 2013-09-29 ENCOUNTER — Telehealth: Payer: Self-pay

## 2013-09-29 ENCOUNTER — Other Ambulatory Visit: Payer: Self-pay | Admitting: Cardiovascular Disease

## 2013-09-29 NOTE — Telephone Encounter (Signed)
LM with spouse for CB

## 2013-09-30 ENCOUNTER — Ambulatory Visit (INDEPENDENT_AMBULATORY_CARE_PROVIDER_SITE_OTHER): Payer: Medicare Other | Admitting: Internal Medicine

## 2013-09-30 ENCOUNTER — Encounter: Payer: Self-pay | Admitting: Internal Medicine

## 2013-09-30 VITALS — BP 142/82 | HR 73 | Temp 98.4°F | Ht 71.25 in | Wt 261.0 lb

## 2013-09-30 DIAGNOSIS — E782 Mixed hyperlipidemia: Secondary | ICD-10-CM

## 2013-09-30 DIAGNOSIS — I2510 Atherosclerotic heart disease of native coronary artery without angina pectoris: Secondary | ICD-10-CM

## 2013-09-30 DIAGNOSIS — I10 Essential (primary) hypertension: Secondary | ICD-10-CM

## 2013-09-30 DIAGNOSIS — N429 Disorder of prostate, unspecified: Secondary | ICD-10-CM

## 2013-09-30 DIAGNOSIS — Z0000 Encounter for general adult medical examination without abnormal findings: Secondary | ICD-10-CM

## 2013-09-30 DIAGNOSIS — IMO0001 Reserved for inherently not codable concepts without codable children: Secondary | ICD-10-CM

## 2013-09-30 DIAGNOSIS — Z86010 Personal history of colonic polyps: Secondary | ICD-10-CM

## 2013-09-30 NOTE — Telephone Encounter (Signed)
Medication List and allergies:  Reviewed and updated  90 day supply/mail order: [facility] after 10/15/2013 Local prescriptions: [facility] and [facility]  Immunizations due: shingles  A/P:   No changes to FH or PSH or personal hx Flu vaccine--07/2013 PNA--10/2010 Tdap--05/2011 CCS--09/2012--Dr Medoff--polyp--next due 2018 PSA--none noted  To Discuss with Provider: Not at this time

## 2013-09-30 NOTE — Patient Instructions (Signed)
Order for labs entered into  the computer; these will be performed at [address] [facility]. across from [facility]. No appointment is necessary. Your next office appointment will be determined based upon review of your pending labs . Those instructions will be transmitted to you through My Chart .

## 2013-09-30 NOTE — Progress Notes (Signed)
Subjective:    Patient ID: [name], male    DOB: [birth year], 72 y.o.   MRN: [id number]  HPI  Medicare Wellness Visit: Psychosocial and medical history were reviewed as required by Medicare (history related to abuse, antisocial behavior , firearm risk). Social history: Caffeine:16 oz coffee/ day  , Alcohol:1-2 drinks/ day  , Tobacco WUJ:WJXB 2000 Exercise:see below Personal safety/fall risk:no Limitations of activities of daily living:no Seatbelt/ smoke alarm use:yes Healthcare Power of Attorney/Living Will status: in place Ophthalmologic exam status:current Hearing evaluation status:not current Orientation: Oriented X 3 Memory and recall: good Spelling or math testing: good Depression/anxiety assessment: no Foreign travel history:[place] 2011 Immunization status for influenza/pneumonia/ shingles /tetanus:current Transfusion history:no Preventive health care maintenance status: Colonoscopy as per protocol/standard care:current Dental care:every 6 mos Chart reviewed and updated. Active issues reviewed and addressed as documented below.    Review of Systems A heart healthy diet is followed; exercise encompasses 60 minutes 3  times per week in gym without symptoms.  Family history is negative for premature coronary disease. With CAD   LDL goal is less than 70. There is medication compliance with the statin.  Low dose ASA taken but also 3 NSAIDS/ day Specifically denied are  chest pain, palpitations, dyspnea, or claudication.  Significant abdominal symptoms or  memory deficit not present. Some myalgias in thighs     Objective:   Physical Exam Gen.: Healthy and well-nourished in appearance. Alert, appropriate and cooperative throughout exam.Appears younger than stated age  Head: Normocephalic without obvious abnormalities  Eyes: No corneal or conjunctival inflammation noted. Pupils equal round reactive to light and accommodation. Extraocular motion intact. Ptosis  bilaterally Ears: External  ear exam reveals no significant lesions or deformities. Canals clear .TMs normal. Hearing is grossly decreased on R. Nose: External nasal exam reveals no deformity or inflammation. Nasal mucosa are pink and moist. No lesions or exudates noted. Septum slightly deviated  Mouth: Oral mucosa and oropharynx reveal no lesions or exudates. Teeth in good repair. Neck: No deformities, masses, or tenderness noted. Range of motion & Thyroid normal. Lungs: Normal respiratory effort; chest expands symmetrically. Lungs are clear to auscultation without rales, wheezes, or increased work of breathing. Heart: Normal rate and rhythm. Normal S1 and S2. No gallop, click, or rub. S 4 w/o murmur. Abdomen: Bowel sounds normal; abdomen soft and nontender. No masses, organomegaly or hernias noted. Genitalia: deferred ; colonoscopy this year                                 Musculoskeletal/extremities: No deformity or scoliosis noted of  the thoracic or lumbar spine.   OR Accentuated curvature of upper thoracic spine. OR There is some asymmetry of the posterior thoracic musculature suggesting occult scoliosis. o clubbing, cyanosis, edema, or significant extremity  deformity noted. Range of motion decreased @ knees .Tone & strength normal. Hand joints normal OR reveal mild  DJD DIP changes. Fingernail / toenail health good. Able to lie down & sit up w/o help. Negative SLR bilaterally Vascular: Carotid, radial artery, dorsalis pedis and  posterior tibial pulses are full and equal. No bruits present. Neurologic: Alert and oriented x3. Deep tendon reflexes symmetrical but 0-1/2+ @ knees.       Skin: Intact without suspicious lesions or rashes. Birthmark R elbow Lymph: No cervical, axillary lymphadenopathy present. Psych: Mood and affect are normal. Normally interactive  Assessment & Plan:  #1 Medicare  Wellness Exam; criteria met ; data entered #2 Problem List/Diagnoses reviewed Plan:  Assessments made/ Orders entered

## 2013-09-30 NOTE — Progress Notes (Signed)
Pre visit review using our clinic review tool, if applicable. No additional management support is needed unless otherwise documented below in the visit note. 

## 2013-10-05 ENCOUNTER — Other Ambulatory Visit (INDEPENDENT_AMBULATORY_CARE_PROVIDER_SITE_OTHER): Payer: Medicare Other

## 2013-10-05 DIAGNOSIS — I10 Essential (primary) hypertension: Secondary | ICD-10-CM

## 2013-10-05 DIAGNOSIS — IMO0001 Reserved for inherently not codable concepts without codable children: Secondary | ICD-10-CM

## 2013-10-05 DIAGNOSIS — Z86010 Personal history of colonic polyps: Secondary | ICD-10-CM

## 2013-10-05 DIAGNOSIS — E782 Mixed hyperlipidemia: Secondary | ICD-10-CM

## 2013-10-05 DIAGNOSIS — N429 Disorder of prostate, unspecified: Secondary | ICD-10-CM

## 2013-10-05 LAB — BASIC METABOLIC PANEL
BUN: 18 mg/dL (ref 6–23)
CO2: 28 mEq/L (ref 19–32)
Chloride: 103 mEq/L (ref 96–112)
Creatinine, Ser: 1.1 mg/dL (ref 0.4–1.5)
GFR: 69.59 mL/min (ref >60.00)
Glucose, Bld: 137 mg/dL — ABNORMAL HIGH (ref 70–99)

## 2013-10-05 LAB — HEPATIC FUNCTION PANEL
ALT: 26 U/L (ref 0–53)
Albumin: 4.2 g/dL (ref 3.5–5.2)
Bilirubin, Direct: 0.2 mg/dL (ref 0.0–0.3)
Total Bilirubin: 1.3 mg/dL — ABNORMAL HIGH (ref 0.3–1.2)
Total Protein: 6.6 g/dL (ref 6.0–8.3)

## 2013-10-05 LAB — CBC WITH DIFFERENTIAL/PLATELET
Eosinophils Absolute: 0.2 10*3/uL (ref 0.0–0.7)
Eosinophils Relative: 2.5 % (ref 0.0–5.0)
HCT: 49.3 % (ref 39.0–52.0)
Lymphs Abs: 1.6 10*3/uL (ref 0.7–4.0)
MCHC: 33.8 g/dL (ref 30.0–36.0)
MCV: 95.2 fl (ref 78.0–100.0)
Monocytes Absolute: 0.8 10*3/uL (ref 0.1–1.0)
Monocytes Relative: 9.4 % (ref 3.0–12.0)
Neutro Abs: 6.3 10*3/uL (ref 1.4–7.7)
Neutrophils Relative %: 69.6 % (ref 43.0–77.0)
Platelets: 172 10*3/uL (ref 150.0–400.0)

## 2013-10-05 LAB — LIPID PANEL
Cholesterol: 130 mg/dL (ref 0–200)
HDL: 44.7 mg/dL (ref >39.00)
LDL Cholesterol: 71 mg/dL (ref 0–99)
Triglycerides: 72 mg/dL (ref 0.0–149.0)
VLDL: 14.4 mg/dL (ref 0.0–40.0)

## 2013-10-05 LAB — CK: Total CK: 99 U/L (ref 7–232)

## 2013-10-06 ENCOUNTER — Ambulatory Visit: Payer: Medicare Other

## 2013-10-06 DIAGNOSIS — R7309 Other abnormal glucose: Secondary | ICD-10-CM

## 2013-10-07 ENCOUNTER — Encounter: Payer: Medicare Other | Admitting: Internal Medicine

## 2013-10-09 LAB — HEMOGLOBIN A1C: Hgb A1c MFr Bld: 6.4 % (ref 4.6–6.5)

## 2013-10-15 ENCOUNTER — Other Ambulatory Visit: Payer: Self-pay | Admitting: Internal Medicine

## 2013-10-15 ENCOUNTER — Other Ambulatory Visit: Payer: Self-pay | Admitting: Cardiovascular Disease

## 2013-10-16 NOTE — Telephone Encounter (Signed)
Losartan refilled per protocol. JG//CMA 

## 2013-10-22 ENCOUNTER — Other Ambulatory Visit: Payer: Self-pay | Admitting: Cardiovascular Disease

## 2013-10-23 ENCOUNTER — Encounter: Payer: Self-pay | Admitting: Internal Medicine

## 2013-10-26 ENCOUNTER — Other Ambulatory Visit: Payer: Self-pay

## 2013-10-26 ENCOUNTER — Encounter: Payer: Self-pay | Admitting: Internal Medicine

## 2013-10-26 MED ORDER — METOPROLOL TARTRATE 25 MG PO TABS: ORAL_TABLET | ORAL | Status: DC

## 2013-10-27 ENCOUNTER — Encounter: Payer: Self-pay | Admitting: Internal Medicine

## 2013-10-27 MED ORDER — ROSUVASTATIN CALCIUM 40 MG PO TABS
ORAL_TABLET | ORAL | Status: DC
Start: 2013-10-27 — End: 2013-10-30

## 2013-10-27 NOTE — Telephone Encounter (Signed)
Med filled.  

## 2013-10-28 ENCOUNTER — Encounter: Payer: Self-pay | Admitting: Cardiovascular Disease

## 2013-10-30 ENCOUNTER — Other Ambulatory Visit: Payer: Self-pay | Admitting: Nurse Practitioner

## 2013-10-30 ENCOUNTER — Ambulatory Visit (INDEPENDENT_AMBULATORY_CARE_PROVIDER_SITE_OTHER): Payer: Medicare HMO | Admitting: Nurse Practitioner

## 2013-10-30 ENCOUNTER — Encounter: Payer: Self-pay | Admitting: Nurse Practitioner

## 2013-10-30 VITALS — BP 150/90 | HR 58 | Ht 72.0 in | Wt 267.0 lb

## 2013-10-30 DIAGNOSIS — I4891 Unspecified atrial fibrillation: Secondary | ICD-10-CM

## 2013-10-30 DIAGNOSIS — I2510 Atherosclerotic heart disease of native coronary artery without angina pectoris: Secondary | ICD-10-CM

## 2013-10-30 MED ORDER — METOPROLOL TARTRATE 25 MG PO TABS: ORAL_TABLET | ORAL | Status: DC

## 2013-10-30 MED ORDER — HYDROCHLOROTHIAZIDE 25 MG PO TABS: ORAL_TABLET | ORAL | Status: DC

## 2013-10-30 MED ORDER — ROSUVASTATIN CALCIUM 40 MG PO TABS: ORAL_TABLET | ORAL | Status: DC

## 2013-10-30 NOTE — Patient Instructions (Addendum)
I think you are doing well  Stay on your current medicines - I have refilled your Metoprolol, the Crestor and the HCTZ  Monitor your blood pressure more at home - goal is 135/85 or less most of the time  Stay active  See Dr. Burt Knack in 6 months  Here are my tips to lose weight:  1. Drink only water. You do not need milk, juice, tea, soda or diet soda.  2. Do not eat anything "white". This includes white bread, potatoes, rice or mayo  3. Stay away from fried foods and sweets  4. Your portion should be the size of the palm of your hand.  5. Know what your weaknesses are and avoid.  6. Find an exercise you like and do it every day for 45 to 60 minutes.   Call the [place] office at [phone] if you have any questions, problems or concerns.

## 2013-10-30 NOTE — Progress Notes (Signed)
[name] Date of Birth: June 10, 1941 Medical Record #[id number]  History of Present Illness: Mr. Hemmelgarn is seen back today for a follow up visit. Seen for Dr. Burt Knack. He has known CAD with past inferior MI in 2000 with last PCI in 2001 with balloon PCI for ISR of the RCA. Other issues include HLD and HTN.  Has not been seen in almost 2 years. Was doing ok at his last visit - continued to struggle with weight but seemed to be doing ok. Was referred for GXT testing.   Comes back today. Here alone. He needs to get his medicines refilled. Out of his Crestor for the last couple of days. Feeling good. No chest pain. Not short of breath. Exercising but sounds like sporadic. Continues to struggle with weight. He is working as a Dispensing optician. Not checking his BP at home. Tries to not use too much salt.   Current Outpatient Prescriptions  Medication Sig Dispense Refill  . aspirin 81 MG tablet Take 81 mg by mouth daily.        Marland Kitchen co-enzyme Q-10 30 MG capsule Take 100 mg by mouth as needed.      . fluticasone (FLONASE) 50 MCG/ACT nasal spray Place 1 spray into the nose 2 (two) times daily as needed for rhinitis.  16 g  11  . hydrochlorothiazide (HYDRODIURIL) 25 MG tablet TAKE 1 TABLET BY MOUTH ONCE DAILY  30 tablet  0  . ibuprofen (ADVIL,MOTRIN) 200 MG tablet Take 600 mg by mouth daily. Three 200mg  tabs A DAY      . losartan (COZAAR) 100 MG tablet TAKE 1 TABLET BY MOUTH DAILY INSTEAD OF RAMIPRIL  30 tablet  5  . metoprolol tartrate (LOPRESSOR) 25 MG tablet TAKE 1 TABLET BY MOUTH TWICE DAILY  180 tablet  0  . Multiple Vitamin (MULTIVITAMIN) tablet Take 1 tablet by mouth daily.        . rosuvastatin (CRESTOR) 40 MG tablet TAKE 1 TABLET BY MOUTH DAILY.  30 tablet  6   No current facility-administered medications for this visit.    Allergies  Allergen Reactions  . Ramipril     12/31/12 COUGH    Past Medical History  Diagnosis Date  . 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 Other abnormal glucose   . Coronary artery disease   . Hyperlipidemia   . Hypertension   . Lichen planus     oral    Past Surgical History  Procedure Laterality Date  . Coronary angioplasty with stent placement  2000  . Appendectomy    . Cholecystectomy    . Tonsillectomy    . Vasectomy      History  Smoking status  . Former Smoker  . Quit date: 10/15/1997  Smokeless tobacco  . Not on file    Comment: smoked 1961-1999, up to 1/2 ppd. Off 3-4 years 1961-1999. Cigar 3/week for 1 year    History  Alcohol Use  . Yes    Comment: socially  1-2/ day    Family History  Problem Relation Age of Onset  . Coronary artery disease Father     MIx3. Initial MI @ 64  . Diabetes Father   . Stroke Father   . COPD Mother   . Diabetes Mother   . Hypertension Brother     DM- CABG  . Kidney failure Sister     Review of Systems: The review of systems is per the HPI.  All other systems were reviewed and are negative.  Physical Exam: BP 150/90  Pulse 58  Ht 6' (1.829 m)  Wt 267 lb (121.11 kg)  BMI 36.20 kg/m2  SpO2 96% BP by me is 140/70. Patient is very pleasant and in no acute distress. He is obese. Weight up 6 pounds in last month. Skin is warm and dry. Color is normal.  HEENT is unremarkable. Normocephalic/atraumatic. PERRL. Sclera are nonicteric. Neck is supple. No masses. No JVD. Lungs are clear. Cardiac exam shows a regular rate and rhythm. Abdomen is soft. Extremities are without edema. Gait and ROM are intact. No gross neurologic deficits noted.  Wt Readings from Last 3 Encounters:  10/30/13 267 lb (121.11 kg)  09/30/13 261 lb (118.389 kg)  12/31/12 254 lb 6.4 oz (115.395 kg)     LABORATORY DATA:  Lab Results  Component Value Date   WBC 9.1 10/05/2013   HGB 16.6 10/05/2013   HCT 49.3 10/05/2013   PLT 172.0 10/05/2013   GLUCOSE 137* 10/05/2013   CHOL 130 10/05/2013   TRIG 72.0 10/05/2013   HDL 44.70 10/05/2013   LDLCALC 71 10/05/2013    ALT 26 10/05/2013   AST 20 10/05/2013   NA 140 10/05/2013   K 4.1 10/05/2013   CL 103 10/05/2013   CREATININE 1.1 10/05/2013   BUN 18 10/05/2013   CO2 28 10/05/2013   TSH 2.42 10/05/2013   PSA 1.25 10/05/2013   INR 1.0 05/03/2008   HGBA1C 6.4 10/06/2013     Assessment / Plan: 1. CAD - remote MI/PCI - doing well clinically - will continue with current regimen - CV risk factor modification encouraged.   2. HTN - recheck of his BP is ok - I have asked him to monitor at home. For now, no change in his regimen.   3. HLD - on statin  4. Obesity - discussed in detail  I have refilled his medicines. His labs are up to date. See him back in 6 months.   Patient is agreeable to this plan and will call if any problems develop in the interim.   Burtis Junes, RN, [place] [address] [place], [place]  [phone]

## 2013-11-04 ENCOUNTER — Encounter: Payer: Medicare Other | Admitting: Internal Medicine

## 2013-11-05 ENCOUNTER — Encounter: Payer: Self-pay | Admitting: Internal Medicine

## 2013-11-09 ENCOUNTER — Telehealth: Payer: Self-pay | Admitting: *Deleted

## 2013-11-09 DIAGNOSIS — Z1159 Encounter for screening for other viral diseases: Secondary | ICD-10-CM

## 2013-11-09 NOTE — Telephone Encounter (Signed)
Message copied by Harl Bowie on Mon Nov 09, 2013 12:51 PM ------      Message from: Hendricks Limes      Created: Thu Nov 05, 2013 12:58 PM       See My Chart message please re Hepatitis immunization status ------

## 2013-11-09 NOTE — Telephone Encounter (Signed)
Spoke with the pt and informed him that we did not have any record of him having a Hep B immunizations.  Pt stated that he need to have it for work because of [employer].  Informed the pt that he can come in and have a Hep B titer drawn per Dr. Linna Darner.  Pt agreed and scheduled a lab appt for Friday (11-13-13 @ 8:15am).  Future lab ordered and sent.//AB/CMA

## 2013-11-13 ENCOUNTER — Other Ambulatory Visit: Payer: Commercial Managed Care - HMO

## 2013-11-13 ENCOUNTER — Other Ambulatory Visit: Payer: Medicare HMO

## 2013-11-13 DIAGNOSIS — Z1159 Encounter for screening for other viral diseases: Secondary | ICD-10-CM

## 2013-11-13 LAB — HEPATITIS B SURFACE ANTIBODY,QUALITATIVE: Hep B S Ab: POSITIVE — AB

## 2013-11-14 ENCOUNTER — Encounter: Payer: Self-pay | Admitting: Internal Medicine

## 2014-01-04 ENCOUNTER — Encounter: Payer: Self-pay | Admitting: Internal Medicine

## 2014-01-06 ENCOUNTER — Telehealth: Payer: Self-pay | Admitting: Internal Medicine

## 2014-01-06 NOTE — Telephone Encounter (Signed)
Faxed to [employer] @ [phone], awaiting auth for Dr Bing Plume.

## 2014-01-07 ENCOUNTER — Encounter: Payer: Self-pay | Admitting: Internal Medicine

## 2014-01-07 ENCOUNTER — Ambulatory Visit (INDEPENDENT_AMBULATORY_CARE_PROVIDER_SITE_OTHER): Payer: Medicare HMO | Admitting: Internal Medicine

## 2014-01-07 VITALS — BP 100/78 | HR 66 | Temp 97.6°F | Resp 14 | Wt 257.2 lb

## 2014-01-07 DIAGNOSIS — R52 Pain, unspecified: Secondary | ICD-10-CM

## 2014-01-07 DIAGNOSIS — G8929 Other chronic pain: Secondary | ICD-10-CM

## 2014-01-07 DIAGNOSIS — L439 Lichen planus, unspecified: Secondary | ICD-10-CM

## 2014-01-07 DIAGNOSIS — M545 Low back pain, unspecified: Secondary | ICD-10-CM

## 2014-01-07 DIAGNOSIS — Z9889 Other specified postprocedural states: Secondary | ICD-10-CM

## 2014-01-07 DIAGNOSIS — Z9849 Cataract extraction status, unspecified eye: Secondary | ICD-10-CM

## 2014-01-07 DIAGNOSIS — Z961 Presence of intraocular lens: Secondary | ICD-10-CM

## 2014-01-07 MED ORDER — TRAMADOL HCL 50 MG PO TABS: 50.0000 mg | ORAL_TABLET | Freq: Four times a day (QID) | ORAL | Status: DC | PRN

## 2014-01-07 MED ORDER — CYCLOBENZAPRINE HCL 5 MG PO TABS: ORAL_TABLET | ORAL | Status: DC

## 2014-01-07 NOTE — Progress Notes (Signed)
Pre visit review using our clinic review tool, if applicable. No additional management support is needed unless otherwise documented below in the visit note. 

## 2014-01-07 NOTE — Progress Notes (Signed)
   Subjective:    Patient ID: [name], male    DOB: Jan 15, 1941, 73 y.o.   MRN: [id number]  HPI He has had chronic recurrent low back symptoms for years without any specific trigger or injury other than decades of practicing dentistry. 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  Exacerbation occurred 01/02/14 after reaching out to perform oral exam.  The symptoms occur at the hip level associated with a stiffness which is constant  Nonsteroidals do provide some benefit.    Review of Systems  He specifically denies constitutional symptoms of fever, chills, sweats, unexplained weight loss, or unexplained fatigue.  There is no abdominal pain, melena, rectal bleeding.  He has no dysuria, pyuria, or hematuria.  The stiffness is not associated with numbness, tingling, weakness in the legs.  There's been no change in color or temp of the skin over the area of discomfort.  He has had no urine or stool incontinence.      Objective:   Physical Exam  General appearance is one of good health and nourishment w/o distress.  Eyes: No conjunctival inflammation or scleral icterus is present. Neck supple with full ROM   Heart:  Normal rate and regular rhythm. S1 and S2 normal without gallop, murmur, click, rub or other extra sounds     Lungs:Chest clear to auscultation; no wheezes, rhonchi,rales ,or rubs present.No increased work of breathing.   Abdomen: bowel sounds normal, soft and non-tender without masses, or organomegaly. Ventral hernia noted.  No guarding or rebound . No tenderness over the flanks to percussion  Musculoskeletal: Able to lie flat and sit up without help. Negative straight leg raising bilaterally. Slight limp LLE.  Neuro: Absent reflexes at the knees. Heel and toe walking normal.  Skin:Warm & dry.  Intact without suspicious lesions or rashes ; no jaundice or tenting  Lymphatic: No lymphadenopathy is  noted about the head, neck, axilla.                Assessment & Plan:  #1 mechanical low back with exacerbation due to repetitive motion and poor ergonomics  #2 status post cataract lens implant; followup do  See orders

## 2014-01-07 NOTE — Patient Instructions (Signed)
The best exercises for the low back include freestyle swimming, stretch aerobics, and yoga.Cybex & Nautilus machines rather than dead weights are better for the back. 

## 2014-04-23 ENCOUNTER — Ambulatory Visit (INDEPENDENT_AMBULATORY_CARE_PROVIDER_SITE_OTHER): Payer: Commercial Managed Care - HMO | Admitting: Nurse Practitioner

## 2014-04-23 ENCOUNTER — Encounter: Payer: Self-pay | Admitting: Nurse Practitioner

## 2014-04-23 ENCOUNTER — Other Ambulatory Visit: Payer: Self-pay | Admitting: *Deleted

## 2014-04-23 VITALS — BP 140/92 | HR 52 | Ht 72.0 in | Wt 252.0 lb

## 2014-04-23 DIAGNOSIS — I10 Essential (primary) hypertension: Secondary | ICD-10-CM

## 2014-04-23 DIAGNOSIS — E785 Hyperlipidemia, unspecified: Secondary | ICD-10-CM

## 2014-04-23 DIAGNOSIS — R7309 Other abnormal glucose: Secondary | ICD-10-CM

## 2014-04-23 DIAGNOSIS — I2510 Atherosclerotic heart disease of native coronary artery without angina pectoris: Secondary | ICD-10-CM

## 2014-04-23 LAB — BASIC METABOLIC PANEL
BUN: 22 mg/dL (ref 6–23)
CO2: 30 mEq/L (ref 19–32)
Calcium: 9.7 mg/dL (ref 8.4–10.5)
Chloride: 99 mEq/L (ref 96–112)
Creatinine, Ser: 1.1 mg/dL (ref 0.4–1.5)
GFR: 68.76 mL/min (ref >60.00)
Glucose, Bld: 131 mg/dL — ABNORMAL HIGH (ref 70–99)
Potassium: 4.2 mEq/L (ref 3.5–5.1)
Sodium: 136 mEq/L (ref 135–145)

## 2014-04-23 LAB — LIPID PANEL
Cholesterol: 112 mg/dL (ref 0–200)
HDL: 37.7 mg/dL — ABNORMAL LOW (ref >39.00)
LDL Cholesterol: 61 mg/dL (ref 0–99)
NonHDL: 74.3
Total CHOL/HDL Ratio: 3
Triglycerides: 67 mg/dL (ref 0.0–149.0)
VLDL: 13.4 mg/dL (ref 0.0–40.0)

## 2014-04-23 LAB — HEPATIC FUNCTION PANEL
ALT: 24 U/L (ref 0–53)
AST: 20 U/L (ref 0–37)
Albumin: 4.5 g/dL (ref 3.5–5.2)
Alkaline Phosphatase: 48 U/L (ref 39–117)
Bilirubin, Direct: 0.1 mg/dL (ref 0.0–0.3)
Total Bilirubin: 1.3 mg/dL — ABNORMAL HIGH (ref 0.2–1.2)
Total Protein: 7.1 g/dL (ref 6.0–8.3)

## 2014-04-23 NOTE — Progress Notes (Signed)
[name] Date of Birth: 10/30/1940 Medical Record #[id number]  History of Present Illness: Mr. Bracco is seen back today for a 6 month check. Seen for Dr. Burt Knack. He is a 73 year old male with known CAD with past inferior MI in 2000 with last PCI in 2001 with balloon PCI for ISR of the RCA. Other issues include HLD and HTN.  Last seen here in January of 2015 - was doing ok.   Comes back today. He is here alone. He is doing very well. Feels good. No chest pain. Not short of breath. Losing weight. Feels good on his medicines. 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 Seems to be enjoying his life.   Current Outpatient Prescriptions  Medication Sig Dispense Refill  . aspirin 81 MG tablet Take 81 mg by mouth daily.        Marland Kitchen co-enzyme Q-10 30 MG capsule Take 100 mg by mouth as needed.      . cyclobenzaprine (FLEXERIL) 5 MG tablet 1-2 qhs prn  14 tablet  0  . fluticasone (FLONASE) 50 MCG/ACT nasal spray Place 1 spray into the nose 2 (two) times daily as needed for rhinitis.  16 g  11  . hydrochlorothiazide (HYDRODIURIL) 25 MG tablet TAKE 1 TABLET BY MOUTH ONCE DAILY  30 tablet  11  . ibuprofen (ADVIL,MOTRIN) 200 MG tablet Take 600 mg by mouth daily. Three 200mg  tabs A DAY      . losartan (COZAAR) 100 MG tablet TAKE 1 TABLET BY MOUTH DAILY INSTEAD OF RAMIPRIL  30 tablet  5  . metoprolol tartrate (LOPRESSOR) 25 MG tablet TAKE 1 TABLET BY MOUTH TWICE DAILY  180 tablet  3  . Multiple Vitamin (MULTIVITAMIN) tablet Take 1 tablet by mouth daily.        . rosuvastatin (CRESTOR) 40 MG tablet TAKE 1 TABLET BY MOUTH DAILY.  30 tablet  12   No current facility-administered medications for this visit.    Allergies  Allergen Reactions  . Ramipril     12/31/12 COUGH    Past Medical History  Diagnosis Date  . 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 Other abnormal glucose   . Coronary artery disease   . Hyperlipidemia   . Hypertension   . Lichen planus    oral    Past Surgical History  Procedure Laterality Date  . Coronary angioplasty with stent placement  2000  . Appendectomy    . Cholecystectomy    . Tonsillectomy    . Vasectomy    . Cataract extraction w/ intraocular lens implant Right 2009    Dr Bing Plume  . Refractive surgery  1999    Dr Bing Plume    History  Smoking status  . Former Smoker  . Quit date: 10/15/1997  Smokeless tobacco  . Not on file    Comment: smoked 1961-1999, up to 1/2 ppd. Off 3-4 years 1961-1999. Cigar 3/week for 1 year    History  Alcohol Use  . Yes    Comment: socially  1-2/ day    Family History  Problem Relation Age of Onset  . Coronary artery disease Father     MIx3. Initial MI @ 53  . Diabetes Father   . Stroke Father   . COPD Mother   . Diabetes Mother   . Hypertension Brother     DM- CABG  . Kidney failure Sister     Review of Systems: The review of systems  is per the HPI.  All other systems were reviewed and are negative.  Physical Exam: BP 140/92  Pulse 52  Ht 6' (1.829 m)  Wt 252 lb (114.306 kg)  BMI 34.17 kg/m2 Patient is very pleasant and in no acute distress. His weight is down. Skin is warm and dry. Color is normal.  HEENT is unremarkable. Normocephalic/atraumatic. PERRL. Sclera are nonicteric. Neck is supple. No masses. No JVD. Lungs are clear. Cardiac exam shows a regular rate and rhythm. Abdomen is soft. Extremities are without edema. Gait and ROM are intact. No gross neurologic deficits noted.  Wt Readings from Last 3 Encounters:  04/23/14 252 lb (114.306 kg)  01/07/14 257 lb 3.2 oz (116.665 kg)  10/30/13 267 lb (121.11 kg)    LABORATORY DATA/PROCEDURES:  Lab Results  Component Value Date   WBC 9.1 10/05/2013   HGB 16.6 10/05/2013   HCT 49.3 10/05/2013   PLT 172.0 10/05/2013   GLUCOSE 137* 10/05/2013   CHOL 130 10/05/2013   TRIG 72.0 10/05/2013   HDL 44.70 10/05/2013   LDLCALC 71 10/05/2013   ALT 26 10/05/2013   AST 20 10/05/2013   NA 140 10/05/2013   K  4.1 10/05/2013   CL 103 10/05/2013   CREATININE 1.1 10/05/2013   BUN 18 10/05/2013   CO2 28 10/05/2013   TSH 2.42 10/05/2013   PSA 1.25 10/05/2013   INR 1.0 05/03/2008   HGBA1C 6.4 10/06/2013    BNP (last 3 results) No results found for this basename: PROBNP,  in the last 8760 hours   Assessment / Plan: 1. CAD - no active symptoms. Continue with CV risk factor modification.   2. HTN - BP by me is down to 130/88. No change in current regimen. He has good control at home.   3. HLD - on statin - rechecking labs today.   4. Obesity - actively losing weight.  See back in 6 months.  Patient is agreeable to this plan and will call if any problems develop in the interim.   Burtis Junes, RN, [facility] [address] [place], [place]  [address] [phone]

## 2014-04-23 NOTE — Patient Instructions (Addendum)
Stay on your current medicines  Stay active  We will check labs today  We will see you in 6 months  Call the [place] office at [phone] if you have any questions, problems or concerns.

## 2014-04-26 ENCOUNTER — Other Ambulatory Visit (INDEPENDENT_AMBULATORY_CARE_PROVIDER_SITE_OTHER): Payer: Commercial Managed Care - HMO

## 2014-04-26 DIAGNOSIS — R7309 Other abnormal glucose: Secondary | ICD-10-CM

## 2014-04-26 LAB — HEMOGLOBIN A1C: Hgb A1c MFr Bld: 6.4 % (ref 4.6–6.5)

## 2014-04-28 ENCOUNTER — Other Ambulatory Visit: Payer: Self-pay | Admitting: *Deleted

## 2014-04-28 ENCOUNTER — Telehealth: Payer: Self-pay | Admitting: Nurse Practitioner

## 2014-04-28 DIAGNOSIS — E119 Type 2 diabetes mellitus without complications: Secondary | ICD-10-CM

## 2014-04-28 NOTE — Telephone Encounter (Signed)
Patient is returning your call. Please call back.  °

## 2014-05-03 ENCOUNTER — Other Ambulatory Visit: Payer: Self-pay | Admitting: Internal Medicine

## 2014-07-26 ENCOUNTER — Other Ambulatory Visit (INDEPENDENT_AMBULATORY_CARE_PROVIDER_SITE_OTHER): Payer: Commercial Managed Care - HMO | Admitting: *Deleted

## 2014-07-26 DIAGNOSIS — E119 Type 2 diabetes mellitus without complications: Secondary | ICD-10-CM

## 2014-07-26 LAB — HEMOGLOBIN A1C: Hgb A1c MFr Bld: 6.3 % (ref 4.6–6.5)

## 2014-07-28 ENCOUNTER — Telehealth: Payer: Self-pay | Admitting: Nurse Practitioner

## 2014-07-28 NOTE — Telephone Encounter (Signed)
New message      Returning a nurses call for [employer]

## 2014-08-01 IMAGING — CR DG CHEST 2V
3 series · 3 of 3 positions shown · non-contrast
Comparison: Chest x-ray of 05/03/2008

CLINICAL DATA: Cough and congestion for 10 days

CHEST - 2 VIEW

[view not recorded (1 of 3)]
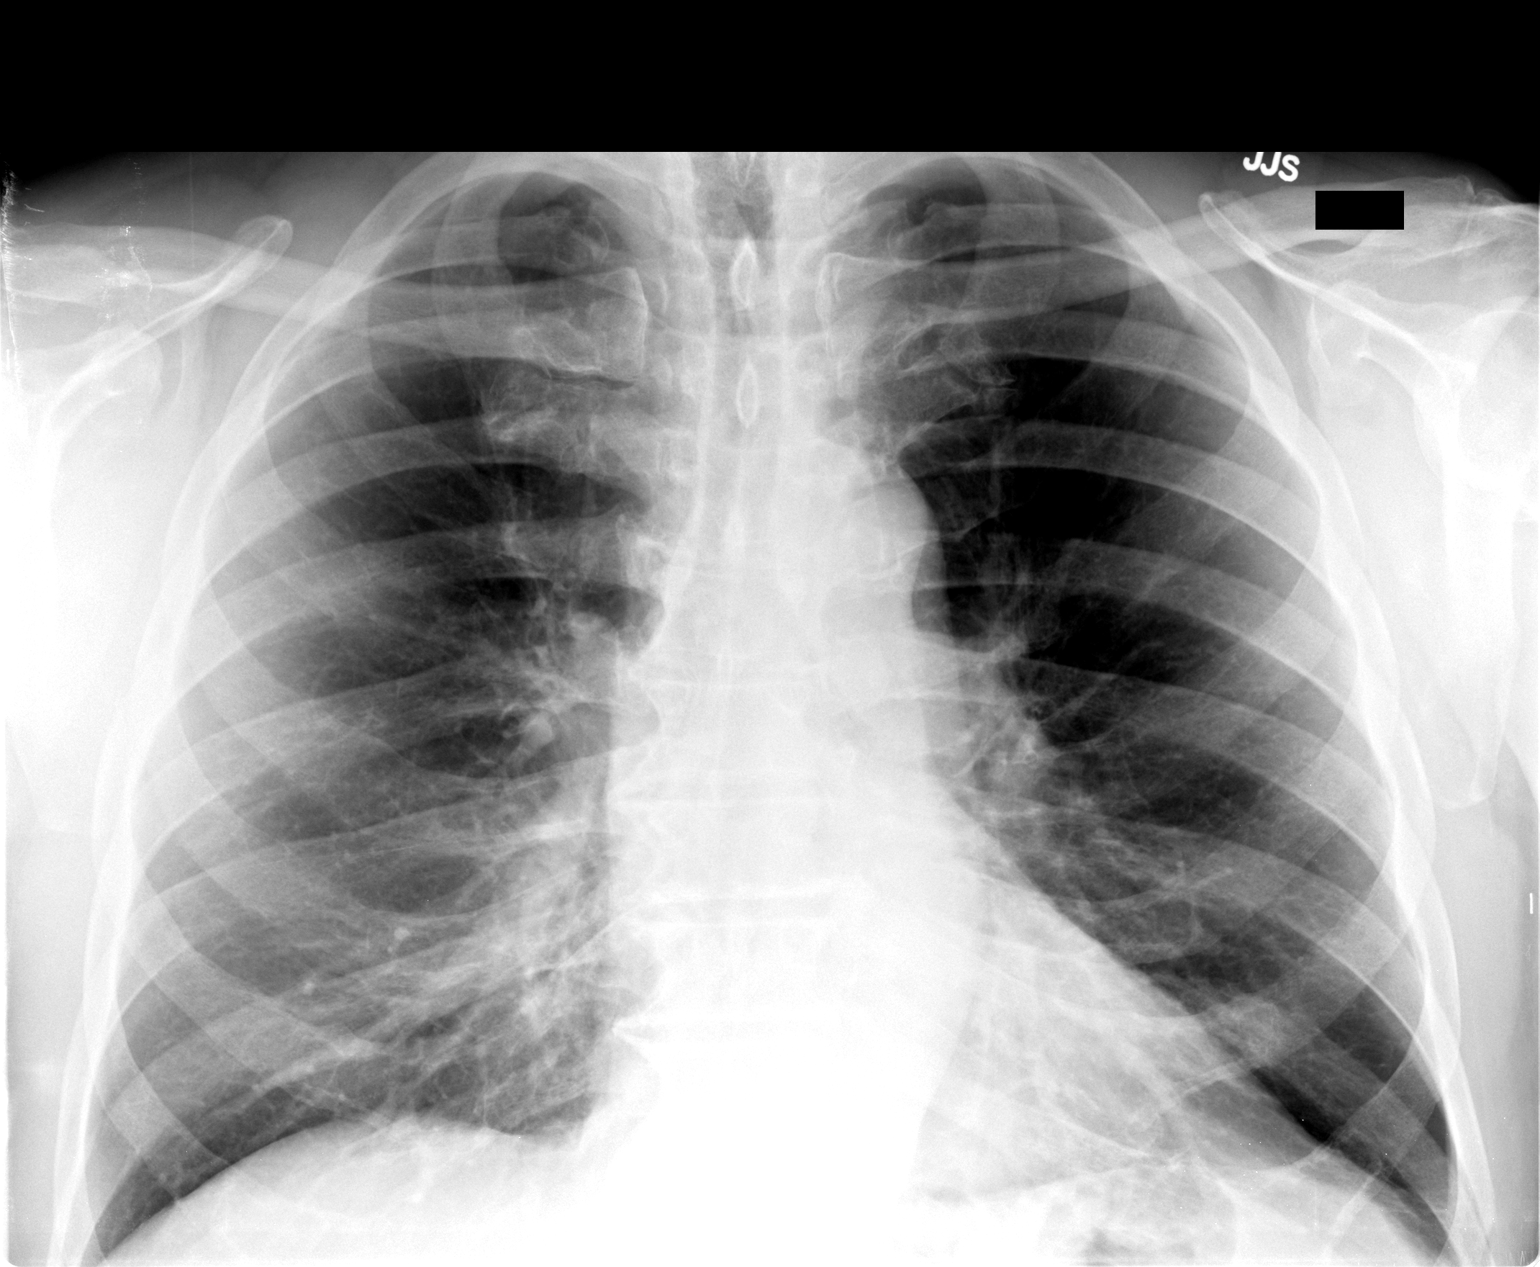

[view not recorded (2 of 3)]
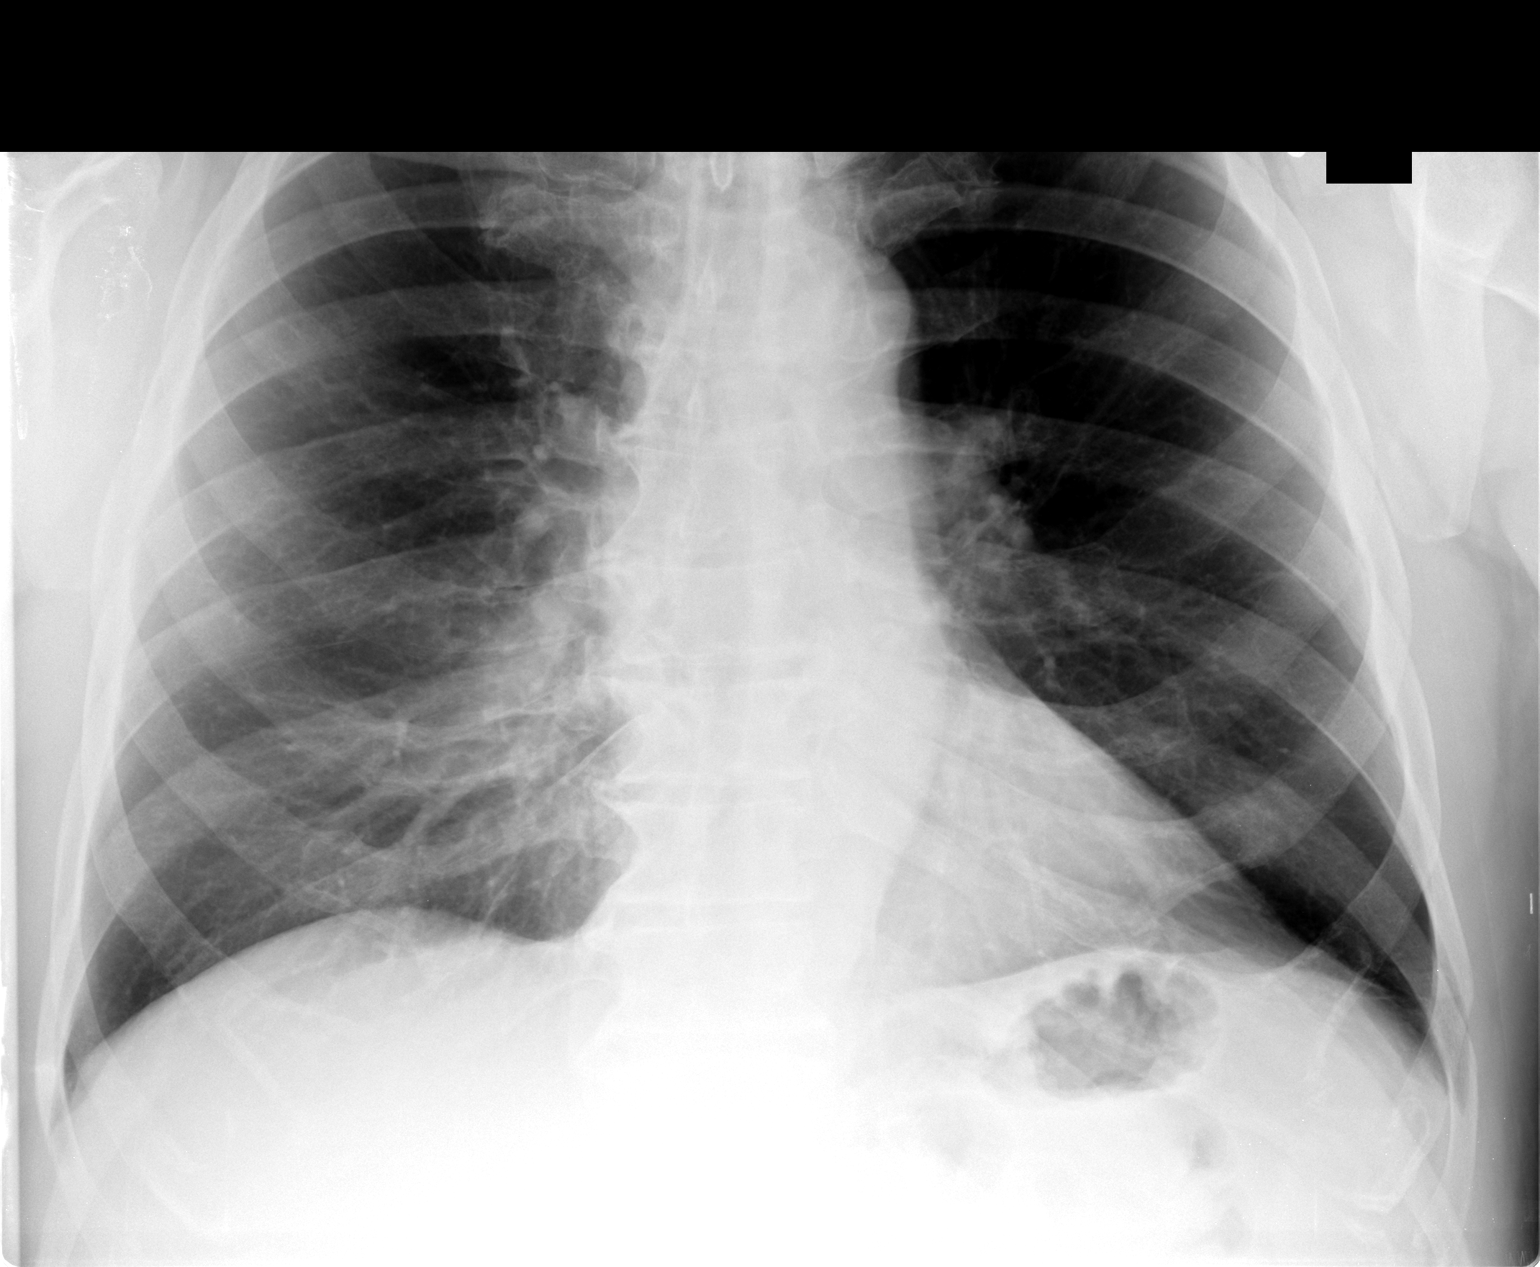

[view not recorded (3 of 3)]
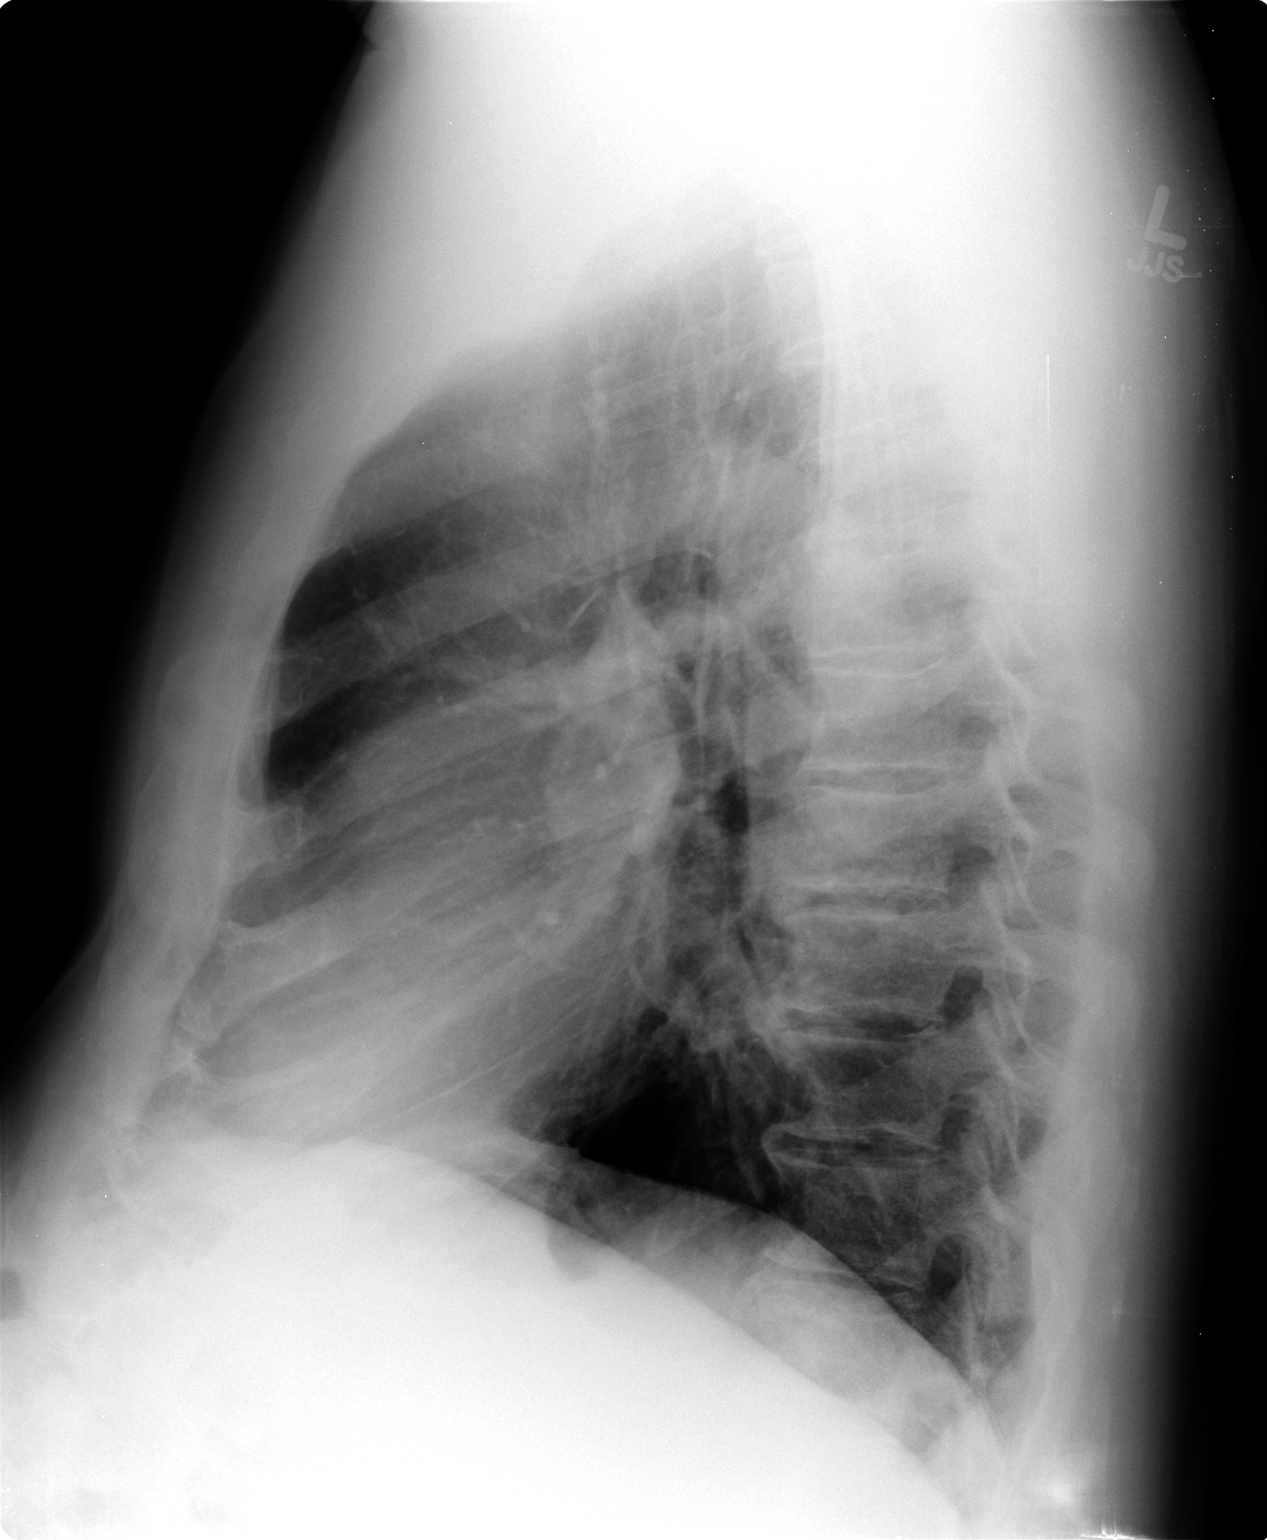

[3 of 3 positions shown; findings below may reference images not displayed]

FINDINGS: The lungs are clear and slightly hyperaerated.
Mediastinal contours are stable.  The heart is within upper limits
of normal.  There are degenerative changes diffusely throughout the
thoracic spine.
IMPRESSION: No active lung disease.  Stable hyperaeration

## 2014-11-01 ENCOUNTER — Other Ambulatory Visit: Payer: Self-pay | Admitting: Internal Medicine

## 2014-11-01 ENCOUNTER — Other Ambulatory Visit: Payer: Self-pay | Admitting: Nurse Practitioner

## 2014-11-15 ENCOUNTER — Encounter: Payer: Self-pay | Admitting: Internal Medicine

## 2014-11-15 ENCOUNTER — Ambulatory Visit (INDEPENDENT_AMBULATORY_CARE_PROVIDER_SITE_OTHER): Payer: Commercial Managed Care - HMO | Admitting: Internal Medicine

## 2014-11-15 VITALS — BP 148/68 | HR 69 | Temp 98.5°F | Ht 72.0 in | Wt 262.2 lb

## 2014-11-15 DIAGNOSIS — J069 Acute upper respiratory infection, unspecified: Secondary | ICD-10-CM

## 2014-11-15 DIAGNOSIS — J209 Acute bronchitis, unspecified: Secondary | ICD-10-CM

## 2014-11-15 MED ORDER — HYDROCODONE-HOMATROPINE 5-1.5 MG/5ML PO SYRP: 5.0000 mL | ORAL_SOLUTION | Freq: Four times a day (QID) | ORAL | Status: DC | PRN

## 2014-11-15 MED ORDER — AMOXICILLIN 500 MG PO CAPS: 500.0000 mg | ORAL_CAPSULE | Freq: Three times a day (TID) | ORAL | Status: DC

## 2014-11-15 MED ORDER — TRIAMCINOLONE ACETONIDE 0.1 % EX CREA: 1.0000 "application " | TOPICAL_CREAM | Freq: Two times a day (BID) | CUTANEOUS | Status: DC

## 2014-11-15 NOTE — Patient Instructions (Addendum)
Plain Mucinex (NOT D) for thick secretions ;force NON dairy fluids .   Nasal cleansing in the shower as discussed with lather of mild shampoo.After 10 seconds wash off lather while  exhaling through nostrils. Make sure that all residual soap is removed to prevent irritation.  Flonase OR Nasacort AQ 1 spray in each nostril twice a day as needed. Use the "crossover" technique into opposite nostril spraying toward opposite ear @ 45 degree angle, not straight up into nostril.  Plain Allegra (NOT D )  160 daily , Loratidine 10 mg , OR Zyrtec 10 mg @ bedtime  as needed for itchy eyes & sneezing.  Minimal Blood Pressure Goal= AVERAGE < 140/90;  Ideal is an AVERAGE < 135/85. This AVERAGE should be calculated from @ least 5-7 BP readings taken @ different times of day on different days of week. You should not respond to isolated BP readings , but rather the AVERAGE for that week .Please bring your  blood pressure cuff to office visits to verify that it is reliable.It  can also be checked against the blood pressure device at the pharmacy. Finger or wrist cuffs are not dependable; an arm cuff is.

## 2014-11-15 NOTE — Progress Notes (Signed)
   Subjective:    Patient ID: [name], male    DOB: 1941/05/24, 74 y.o.   MRN: [id number]  HPI  His symptoms began while he was hunting  in [place] 11/08/14. 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 He also had yellow sputum. The volume of secretions was greater from the head than from the chest.  He's had associated sore throat and fullness in the ears. Cough is dry to productive. He feels as if his chest is somewhat tight.  Review of Systems He denies frontal headache, facial pain, otic discharge, shortness of breath, wheezing.    Objective:   Physical Exam Positive or pertinent findings include: Bilateral ptosis is present. He has a hearing aid on the left. He has slight hyponasal speech. He has resolving herpetic lesions over the upper lip.  General appearance:Adequately nourished; no acute distress or increased work of breathing is present.  No  lymphadenopathy about the head, neck, or axilla noted.  Eyes: No conjunctival inflammation or lid edema is present. There is no scleral icterus. Ears:  External ear exam shows no significant lesions or deformities.  Otoscopic examination reveals clear canals, tympanic membranes are intact bilaterally without bulging, retraction, inflammation or discharge. Nose:  External nasal examination shows no deformity or inflammation. Nasal mucosa are dry without lesions or exudates. No septal dislocation or deviation.No obstruction to airflow.  Oral exam: Dental hygiene is good; lips and gums are healthy appearing.There is no oropharyngeal erythema or exudate noted.  Neck:  No deformities, thyromegaly, masses, or tenderness noted.   Supple with full range of motion without pain.  Heart:  Normal rate and regular rhythm. S1 and S2 normal without gallop, murmur, click, rub or other extra sounds.  Lungs:Chest clear to auscultation; no  wheezes, rhonchi,rales ,or rubs present. Extremities:  No cyanosis, edema, or clubbing  noted  Skin: Warm & dry w/o jaundice or tenting.        Assessment & Plan:  #1 upper respiratory infection with pharyngitis # 2 bronchitis , acute w/o bronchospasm Plan: See orders and recommendations

## 2014-11-15 NOTE — Progress Notes (Signed)
Pre visit review using our clinic review tool, if applicable. No additional management support is needed unless otherwise documented below in the visit note. 

## 2014-12-01 ENCOUNTER — Telehealth: Payer: Self-pay | Admitting: Internal Medicine

## 2014-12-01 MED ORDER — ROSUVASTATIN CALCIUM 40 MG PO TABS: ORAL_TABLET | ORAL | Status: DC

## 2014-12-01 NOTE — Telephone Encounter (Signed)
Pt request refill for Crestor to be send into [facility], pt is going out of town tomorrow and wonder if Dr. Donivan Scull can [name] this ASAP.

## 2015-01-20 ENCOUNTER — Ambulatory Visit (INDEPENDENT_AMBULATORY_CARE_PROVIDER_SITE_OTHER): Payer: Commercial Managed Care - HMO | Admitting: Cardiovascular Disease

## 2015-01-20 ENCOUNTER — Encounter: Payer: Self-pay | Admitting: Cardiovascular Disease

## 2015-01-20 VITALS — BP 124/78 | HR 60 | Ht 72.0 in | Wt 259.0 lb

## 2015-01-20 DIAGNOSIS — I2510 Atherosclerotic heart disease of native coronary artery without angina pectoris: Secondary | ICD-10-CM

## 2015-01-20 DIAGNOSIS — I10 Essential (primary) hypertension: Secondary | ICD-10-CM

## 2015-01-20 DIAGNOSIS — E782 Mixed hyperlipidemia: Secondary | ICD-10-CM

## 2015-01-20 NOTE — Progress Notes (Signed)
Cardiology Office Note   Date:  01/20/2015   ID:  KAILASH HINZE, DOB [birth year], MRN [id number]  PCP:  Unice Cobble, MD  Cardiologist:  [name], MD    Chief Complaint  Patient presents with  . Coronary Artery Disease     History of Present Illness: [name] is a 74 y.o. male who presents for follow-up of coronary artery disease. The patient initially presented in 2000 with an acute inferior wall MI. His most recent PCI procedure was in 2001 when he was treated with balloon angioplasty of severe in-stent restenosis in the right coronary artery. Other medical problems include hypertension and hyperlipidemia. He was last seen by Truitt Merle in July 2015.  The patient continues to feel well. He is physically active with golf and regular exercise. He has no exertional symptoms. He specifically denies chest pain, chest pressure, shortness of breath, or heart palpitations. He is compliant with his medications.  Past Medical History  Diagnosis Date  . 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 Other abnormal glucose   . Coronary artery disease   . Hyperlipidemia   . Hypertension   . Lichen planus     oral    Past Surgical History  Procedure Laterality Date  . Coronary angioplasty with stent placement  2000  . Appendectomy    . Cholecystectomy    . Tonsillectomy    . Vasectomy    . Cataract extraction w/ intraocular lens implant Right 2009    Dr Bing Plume  . Refractive surgery  1999    Dr Bing Plume    Current Outpatient Prescriptions  Medication Sig Dispense Refill  . aspirin 325 MG tablet Take 325 mg by mouth daily.    Marland Kitchen co-enzyme Q-10 30 MG capsule Take 100 mg by mouth as needed.    . hydrochlorothiazide (HYDRODIURIL) 25 MG tablet TAKE 1 TABLET BY MOUTH DAILY 30 tablet 4  . HYDROcodone-homatropine (HYDROMET) 5-1.5 MG/5ML syrup Take 5 mLs by mouth every 6 (six) hours as needed for cough. 120 mL 0  . ibuprofen (ADVIL,MOTRIN) 200 MG tablet  Take 400 mg by mouth daily. Three 200mg  tabs A DAY    . losartan (COZAAR) 100 MG tablet TAKE 1 TABLET BY MOUTH EVERY DAY 30 tablet 5  . metoprolol tartrate (LOPRESSOR) 25 MG tablet TAKE 1 TABLET BY MOUTH TWICE DAILY 180 tablet 3  . Multiple Vitamin (MULTIVITAMIN) tablet Take 1 tablet by mouth daily.      . rosuvastatin (CRESTOR) 40 MG tablet TAKE 1 TABLET BY MOUTH DAILY. 90 tablet 1   No current facility-administered medications for this visit.    Allergies:   Ramipril   Social History:  The patient  reports that he quit smoking about 17 years ago. He does not have any smokeless tobacco history on file. He reports that he drinks alcohol. He reports that he does not use illicit drugs.   Family History:  The patient's family history includes COPD in his mother; Coronary artery disease in his father; Diabetes in his father and mother; Hypertension in his brother; Kidney failure in his sister; Stroke in his father.    ROS:  Please see the history of present illness.  Otherwise, review of systems is positive for low back pain.  All other systems are reviewed and negative.    PHYSICAL EXAM: VS:  BP 124/78 mmHg  Pulse 60  Ht 6' (1.829 m)  Wt 259 lb (117.482 kg)  BMI 35.12 kg/m2 ,  BMI Body mass index is 35.12 kg/(m^2). GEN: Well nourished, well developed, in no acute distress HEENT: normal Neck: no JVD, no masses. No carotid bruits Cardiac: RRR without murmur or gallop                Respiratory:  clear to auscultation bilaterally, normal work of breathing GI: soft, nontender, nondistended, + BS MS: no deformity or atrophy Ext: no pretibial edema, pedal pulses 2+= bilaterally Skin: warm and dry, no rash Neuro:  Strength and sensation are intact Psych: euthymic mood, full affect  EKG:  EKG is ordered today. The ekg ordered today shows normal sinus rhythm 60 bpm, age indeterminate inferior MI, left axis deviation. No change from previous tracing.  Recent Labs: 04/23/2014: ALT 24; BUN  22; Creatinine 1.1; Potassium 4.2; Sodium 136   Lipid Panel     Component Value Date/Time   CHOL 112 04/23/2014 0933   TRIG 67.0 04/23/2014 0933   HDL 37.70* 04/23/2014 0933   CHOLHDL 3 04/23/2014 0933   VLDL 13.4 04/23/2014 0933   LDLCALC 61 04/23/2014 0933      Wt Readings from Last 3 Encounters:  01/20/15 259 lb (117.482 kg)  11/15/14 262 lb 4 oz (118.956 kg)  04/23/14 252 lb (114.306 kg)     Cardiac Studies Reviewed: Cardiac catheterization July 2001: Left main 25% ostial stenosis, LAD 40 and 50% stenoses in the proximal and mid vessel, respectively. Left circumflex patent with 25% OM 2 stenosis, RCA 60% proximal stenosis, 95% stenosis in the third stent in the distal RCA was treated with PTCA. LVEF was 60% with mild inferior wall hypokinesis.  ASSESSMENT AND PLAN: 1.  Coronary artery disease, native vessel:the patient is stable without symptoms of angina. His medical program was reviewed and includes aspirin, statin drug, and a beta blocker. He will follow-up in one year.  2. Essential hypertension:blood pressure is well controlled on a combination of losartan, hydrochlorothiazide, and metoprolol  3. Hyperlipidemia: last lipids reviewed with an LDL less than 70 mg/dL. He will continue on Crestor 40 mg. Will repeat lipids and LFTs at a one-year interval.  4. Obesity: we reviewed the importance of weight loss. He otherwise is doing quite well. He is engaged in regular exercise, so he was counseled regarding dietary changes and portion control   Current medicines are reviewed with the patient today.  The patient does not have concerns regarding medicines.  The following changes have been made:  no change  Labs/ tests ordered today include:   Orders Placed This Encounter  Procedures  . Basic Metabolic Panel (BMET)  . Lipid panel  . Hepatic function panel  . EKG 12-Lead    Disposition:   FU one year  Signed, [name], MD  01/20/2015 9:34 AM    [facility] [place], [place], [place]  [address] Phone: [phone]; Fax: [phone]

## 2015-01-20 NOTE — Patient Instructions (Signed)
Your physician recommends that you return for a FASTING LIPID, LIVER and BMP in June--nothing to eat or drink after midnight, lab opens at 7:30 AM  Your physician recommends that you continue on your current medications as directed. Please refer to the Current Medication list given to you today.  Your physician wants you to follow-up in: 1 YEAR with Dr Burt Knack.  You will receive a reminder letter in the mail two months in advance. If you don't receive a letter, please call our office to schedule the follow-up appointment.

## 2015-02-27 ENCOUNTER — Other Ambulatory Visit: Payer: Self-pay | Admitting: Nurse Practitioner

## 2015-03-22 ENCOUNTER — Other Ambulatory Visit: Payer: Self-pay | Admitting: Internal Medicine

## 2015-03-22 ENCOUNTER — Other Ambulatory Visit: Payer: Self-pay | Admitting: Nurse Practitioner

## 2015-03-23 ENCOUNTER — Other Ambulatory Visit: Payer: Self-pay

## 2015-03-23 MED ORDER — LOSARTAN POTASSIUM 100 MG PO TABS: 100.0000 mg | ORAL_TABLET | Freq: Every day | ORAL | Status: DC

## 2015-03-23 NOTE — Telephone Encounter (Signed)
rx sent to pharm

## 2015-04-04 ENCOUNTER — Other Ambulatory Visit (INDEPENDENT_AMBULATORY_CARE_PROVIDER_SITE_OTHER): Payer: Commercial Managed Care - HMO | Admitting: *Deleted

## 2015-04-04 DIAGNOSIS — I10 Essential (primary) hypertension: Secondary | ICD-10-CM

## 2015-04-04 DIAGNOSIS — I2510 Atherosclerotic heart disease of native coronary artery without angina pectoris: Secondary | ICD-10-CM

## 2015-04-04 DIAGNOSIS — E782 Mixed hyperlipidemia: Secondary | ICD-10-CM

## 2015-04-04 LAB — LIPID PANEL
CHOL/HDL RATIO: 3
Cholesterol: 128 mg/dL (ref 0–200)
HDL: 37.2 mg/dL — AB (ref >39.00)
LDL CALC: 67 mg/dL (ref 0–99)
NonHDL: 90.8
TRIGLYCERIDES: 118 mg/dL (ref 0.0–149.0)
VLDL: 23.6 mg/dL (ref 0.0–40.0)

## 2015-04-04 LAB — HEPATIC FUNCTION PANEL
ALT: 18 U/L (ref 0–53)
AST: 23 U/L (ref 0–37)
Albumin: 4.2 g/dL (ref 3.5–5.2)
Alkaline Phosphatase: 47 U/L (ref 39–117)
Bilirubin, Direct: 0.2 mg/dL (ref 0.0–0.3)
TOTAL PROTEIN: 6.5 g/dL (ref 6.0–8.3)
Total Bilirubin: 1.1 mg/dL (ref 0.2–1.2)

## 2015-04-11 ENCOUNTER — Other Ambulatory Visit: Payer: Self-pay

## 2015-05-16 LAB — HM DIABETES EYE EXAM

## 2015-06-09 ENCOUNTER — Telehealth: Payer: Self-pay | Admitting: Internal Medicine

## 2015-06-09 NOTE — Telephone Encounter (Signed)
Pt is requesting a referral to Dr. Wilhemina Bonito at [facility]. He states he has the diagnosis if you need it

## 2015-06-12 ENCOUNTER — Encounter: Payer: Self-pay | Admitting: Internal Medicine

## 2015-06-13 ENCOUNTER — Telehealth: Payer: Self-pay | Admitting: Emergency Medicine

## 2015-06-13 ENCOUNTER — Other Ambulatory Visit: Payer: Self-pay | Admitting: Internal Medicine

## 2015-06-13 DIAGNOSIS — L439 Lichen planus, unspecified: Secondary | ICD-10-CM

## 2015-06-13 NOTE — Telephone Encounter (Signed)
Please advise 

## 2015-06-13 NOTE — Telephone Encounter (Signed)
LVM for pt informing that Derm Referral has been placed.

## 2015-08-14 ENCOUNTER — Other Ambulatory Visit: Payer: Self-pay | Admitting: Internal Medicine

## 2015-08-15 ENCOUNTER — Other Ambulatory Visit: Payer: Self-pay | Admitting: Emergency Medicine

## 2015-08-15 MED ORDER — LOSARTAN POTASSIUM 100 MG PO TABS: 100.0000 mg | ORAL_TABLET | Freq: Every day | ORAL | Status: DC

## 2015-08-22 ENCOUNTER — Other Ambulatory Visit: Payer: Self-pay | Admitting: Cardiovascular Disease

## 2015-09-01 ENCOUNTER — Telehealth: Payer: Self-pay | Admitting: Internal Medicine

## 2015-09-01 NOTE — Telephone Encounter (Signed)
Called left vm to schedule AWV with Wynetta Fines

## 2015-09-19 ENCOUNTER — Ambulatory Visit (INDEPENDENT_AMBULATORY_CARE_PROVIDER_SITE_OTHER): Payer: Commercial Managed Care - HMO

## 2015-09-19 VITALS — BP 124/70 | Ht 72.0 in | Wt 262.8 lb

## 2015-09-19 DIAGNOSIS — Z0000 Encounter for general adult medical examination without abnormal findings: Secondary | ICD-10-CM | POA: Diagnosis not present

## 2015-09-19 NOTE — Progress Notes (Signed)
Subjective:   [name] is a 74 y.o. male who presents for Medicare Annual/Subsequent preventive examination.  Review of Systems:  HRA assessment completed during visit; The Patient was informed that this wellness visit is to identify risk and educate on how to reduce risk for increase disease through lifestyle changes.   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  HTN; 120 70 Hyperlipidemia 03/2015; chol 128; Trig 118; HDL 37; LDL 67; Ratio 3 A1c average of 6.4 to 6.3 in 10/ 2015; trending up slowly since 2008;  Discussed trending; diet is balanced; eats nutritiously with minimal sugar;   BMI: 35.5 / States he has been overweight most of his life; exercises in school; lifted weights; continues today Diet; Breakfast eats blueberries with hard boiled egg; toast and tomato juice Lunch; sandwich; ham and cheese; tomato; pimento cheese; fruit; bottle of tea unsweetened; snack of 5pm; walnuts;  Dinner; salad; add full avocado between he and his wife daily;  salmon x 2 nights; pork chop; stead x 1; pizza x 1 on occasion; eats at home; Avoids processed foods  Meal from fresh market occasionally  Discussed weight loss; Recommended tracking online at "myfitnesspal" or other site to get baseline information as to calories; Na; carbs; protein etc; Then can formulate weight loss strategy; may substitute eating meal (8:30pm) for meal replacement Protein shake with vegetables; fruit.   Exercise; machines; free weights; does all the machines / crunch; pull down; leg exercises;  Recumbent bike or treadmill x 40 minutes;  Also plays golf  Does volunteer work a couple of days a week;   Safety in home was reviewed with spouse at her [place] in one level home.   Safety reviewed for the home; including removal of clutter; clear paths through the home,  railing as needed; has soaking tub; remodeled you can sit on tub and transfer to the tub; community safety; smoke detectors  Stressors; not at present  Medication review/ no new meds; Stated medication list was accurate Takes 2 ibuprofen and 81mg  of ASA now; ok per Dr. Burt Knack.   Fall assessment  no Gait assessment/ no issues identified  Mobilization and Functional losses in the last year. Total on left and partial knee on the right; doing well but knee hurts some; taking 2 ibuprofen a day now. Stated cardiologist told him to take less asa Sleep patterns; sleeps well   Urinary or fecal incontinence reviewed; no   Counseling: Urine Microalbumin deferred to CPE and scheduled today A1c deferred to CPE Colonoscopy; 09/18/2012; repeat in 5 years; 2018;  EKG: 01/20/2015 Hearing: x 74 yo / wears a hearing aid  Ophthalmology exam; 4 months ago by retinal specialist; (separation x 74 yo) doing well;  Now has area of gray spot in OD; following up w Dr. Tye Savoy; Seen in July 2016;  Call placed for faxed notes and received from Dr.Saunders: Questioned regarding Visual fields but states he is "going to have visual fields checked." (Has not been back to Dr. Bing Plume since April 8th, 2015 per the office)    Immunizations Due  PCV 13 had at [facility] this fall; Called and confirmed date 07/19/2015  Current Care Team reviewed and updated  Cardiac Risk Factors include: advanced age (>  1men, >65 women);dyslipidemia;hypertension;male gender;obesity (BMI >30kg/m2)     Objective:    Vitals: BP 124/70 mmHg  Ht 6' (1.829 m)  Wt 262 lb 12 oz (119.183 kg)  BMI 35.63 kg/m2  Tobacco History  Smoking status  . Former Smoker  . Quit date: 10/15/1997  Smokeless tobacco  . Not on file    Comment: smoked 1961-1999, up to 1/2 ppd. Off 3-4 years 1961-1999. Cigar 3/week for 1 year      Counseling given: Yes   Past Medical History  Diagnosis Date  . 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 Other abnormal glucose   . Coronary artery disease   . Hyperlipidemia   . Hypertension   . Lichen planus     oral   Past Surgical History  Procedure Laterality Date  . Coronary angioplasty with stent placement  2000  . Appendectomy    . Cholecystectomy    . Tonsillectomy    . Vasectomy    . Cataract extraction w/ intraocular lens implant Right 2009    Dr Bing Plume  . Refractive surgery  1999    Dr Bing Plume   Family History  Problem Relation Age of Onset  . Coronary artery disease Father     MIx3. Initial MI @ 35  . Diabetes Father   . Stroke Father   . COPD Mother   . Diabetes Mother   . Hypertension Brother     DM- CABG  . Kidney failure Sister    History  Sexual Activity  . Sexual Activity: Yes    Outpatient Encounter Prescriptions as of 09/19/2015  Medication Sig  . aspirin 325 MG tablet Take 325 mg by mouth daily.  Marland Kitchen co-enzyme Q-10 30 MG capsule Take 100 mg by mouth as needed.  . hydrochlorothiazide (HYDRODIURIL) 25 MG tablet TAKE 1 TABLET BY MOUTH DAILY  . ibuprofen (ADVIL,MOTRIN) 200 MG tablet Take 400 mg by mouth daily. Three 200mg  tabs A DAY  . losartan (COZAAR) 100 MG tablet Take 1 tablet (100 mg total) by mouth daily. MUST HAVE OFFICE VISIT FOR FURTHER REFILLS.  Marland Kitchen metoprolol tartrate (LOPRESSOR) 25 MG tablet TAKE 1 TABLET BY MOUTH TWICE DAILY  . Multiple Vitamin (MULTIVITAMIN) tablet Take 1 tablet by mouth daily.    . rosuvastatin (CRESTOR) 40 MG tablet TAKE 1 TABLET BY MOUTH DAILY.  Marland Kitchen HYDROcodone-homatropine (HYDROMET) 5-1.5 MG/5ML syrup Take 5 mLs by mouth every 6 (six) hours as needed for cough. (Patient not taking: Reported on 09/19/2015)   No facility-administered encounter medications on file as of 09/19/2015.    Activities of Daily Living In your present state of health, do you have any difficulty performing the  following activities: 09/19/2015 11/15/2014  Hearing? N N  Vision? N N  Difficulty concentrating or making decisions? N N  Walking or climbing stairs? N N  Dressing or bathing? N N  Doing errands, shopping? N N  Preparing Food and eating ? N -  Using the Toilet? N -  In the past six months, have you accidently leaked urine? N -  Do you have problems with loss of bowel control? N -  Managing your Medications? N -  Managing your Finances? N -  Housekeeping or managing your Housekeeping? N -    Patient Care Team: Hendricks Limes, MD as PCP - General   Assessment:   Assessment   Patient presents for yearly preventative medicine examination. Medicare questionnaire screening were completed, i.e. Functional; fall risk; depression, memory  loss and hearing.   All immunizations and health maintenance protocols were reviewed with the patient and needed orders were placed./ had prevnar 13  At [facility]; confirmed 07/2015  Education provided for laboratory screens;  Deferred to CPE  Medication reconciliation, past medical history, social history, problem list and allergies were reviewed in detail with the patient  Goals were established with regard to weight loss, exercise, and diet in compliance with medications based on the patient individualized risk;  Agreed to review myfitnesspal to assess calories and possible weight loss strategies  End of life planning was discussed and has been completed     Exercise Activities and Dietary recommendations Current Exercise Habits:: Structured exercise class, Type of exercise: strength training/weights;walking, Time (Minutes): 45, Frequency (Times/Week): 5, Weekly Exercise (Minutes/Week): 225, Intensity: Moderate  Goals    . Weight < 240 lb (108.863 kg)     Lose weight;  Exercise;  (recommended 30 minutes; 5 days a week) BMI reviewed and educated Educated regarding online nutrition programs as GumSearch.nl and http://vang.com/;  Consider  using x1 week;            Fall Risk Fall Risk  09/19/2015 11/15/2014 09/30/2013  Falls in the past year? No No No   Depression Screen PHQ 2/9 Scores 09/19/2015 11/15/2014 09/30/2013  PHQ - 2 Score 0 0 0    Cognitive Testing No flowsheet data found.  Ad8 score 0; continues to volunteer services to community; no issues   Immunization History  Administered Date(s) Administered  . Influenza Whole 09/13/2010, 07/18/2013  . Influenza-Unspecified 07/10/2014, 07/12/2015  . Pneumococcal Conjugate-13 07/19/2015  . Pneumococcal Polysaccharide-23 10/19/2010  . Tdap 05/28/2011  . Zoster 10/01/2003   Screening Tests Health Maintenance  Topic Date Due  . FOOT EXAM  08/29/1951  . OPHTHALMOLOGY EXAM  08/29/1951  . URINE MICROALBUMIN  08/29/1951  . PNA vac Low Risk Adult (2 of 2 - PCV13) 10/20/2011  . HEMOGLOBIN A1C  01/25/2015  . INFLUENZA VACCINE  05/15/2016  . TETANUS/TDAP  05/27/2021  . COLONOSCOPY  09/18/2022  . ZOSTAVAX  Completed      Plan:    During the course of the visit the patient was educated and counseled about the following appropriate screening and preventive services:   Vaccines to include Pneumoccal, Influenza, Hepatitis B, Td, Zostavax, HCV  Electrocardiogram/ 01/21/2015  Cardiovascular Disease/ BP good; Weight is an issue; monitors diet;   Colorectal cancer screening-09/18/2012  Diabetes screening/ deferred to CPE  Prostate Cancer Screening/ deferred to CPE  Glaucoma screening/ Dr. Bing Plume in April 2015 and Dr. Tye Savoy in July 2016  Nutrition counseling / reviewed  Smoking cessation counseling quit; no further smoking  Patient Instructions (the written plan) was given to the patient.    Wynetta Fines, RN  09/19/2015

## 2015-09-19 NOTE — Patient Instructions (Addendum)
[name] , Thank you for taking time to come for your Medicare Wellness Visit. I appreciate your ongoing commitment to your health goals. Please review the following plan we discussed and let me know if I can assist you in the future.   Needs apt with Dr. Linna Darner for CPE   Will try to lose approximately 25lbs;  Exercise;  (recommended 30 minutes; 5 days a week) BMI reviewed and educated Educated regarding online nutrition programs as GumSearch.nl and http://vang.com/;    These are the goals we discussed: Goals    None      This is a list of the screening recommended for you and due dates:  Health Maintenance  Topic Date Due  . Complete foot exam   08/29/1951  . Eye exam for diabetics  08/29/1951  . Urine Protein Check  08/29/1951  . Pneumonia vaccines (2 of 2 - PCV13) 10/20/2011  . Hemoglobin A1C  01/25/2015  . Flu Shot  05/15/2016  . Tetanus Vaccine  05/27/2021  . Colon Cancer Screening  09/18/2022  . Shingles Vaccine  Completed     Health Maintenance, Male A healthy lifestyle and preventative care can promote health and wellness.  Maintain regular health, dental, and eye exams.  Eat a healthy diet. Foods like vegetables, fruits, whole grains, low-fat dairy products, and lean protein foods contain the nutrients you need and are low in calories. Decrease your intake of foods high in solid fats, added sugars, and salt. Get information about a proper diet from your health care provider, if necessary.  Regular physical exercise is one of the most important things you can do for your health. Most adults should get at least 150 minutes of moderate-intensity exercise (any activity that increases your heart rate and causes you to sweat) each week. In addition, most adults need muscle-strengthening exercises on 2 or more days a week.   Maintain a healthy weight. The body mass index (BMI) is a screening tool to identify possible weight problems. It provides an estimate of body  fat based on height and weight. Your health care provider can find your BMI and can help you achieve or maintain a healthy weight. For males 20 years and older:  A BMI below 18.5 is considered underweight.  A BMI of 18.5 to 24.9 is normal.  A BMI of 25 to 29.9 is considered overweight.  A BMI of 30 and above is considered obese.  Maintain normal blood lipids and cholesterol by exercising and minimizing your intake of saturated fat. Eat a balanced diet with plenty of fruits and vegetables. Blood tests for lipids and cholesterol should begin at age 12 and be repeated every 5 years. If your lipid or cholesterol levels are high, you are over age 48, or you are at high risk for heart disease, you may need your cholesterol levels checked more frequently.Ongoing high lipid and cholesterol levels should be treated with medicines if diet and exercise are not working.  If you smoke, find out from your health care provider how to quit. If you do not use tobacco, do not start.  Lung cancer screening is recommended for adults aged 51-80 years who are at high risk for developing lung cancer because of a history of smoking. A yearly low-dose CT scan of the lungs is recommended for people who have at least a 30-pack-year history of smoking and are current smokers or have quit within the past 15 years. A pack year of smoking is smoking an average of 1  pack of cigarettes a day for 1 year (for example, a 30-pack-year history of smoking could mean smoking 1 pack a day for 30 years or 2 packs a day for 15 years). Yearly screening should continue until the smoker has stopped smoking for at least 15 years. Yearly screening should be stopped for people who develop a health problem that would prevent them from having lung cancer treatment.  If you choose to drink alcohol, do not have more than 2 drinks per day. One drink is considered to be 12 oz (360 mL) of beer, 5 oz (150 mL) of wine, or 1.5 oz (45 mL) of liquor.  Avoid  the use of street drugs. Do not share needles with anyone. Ask for help if you need support or instructions about stopping the use of drugs.  High blood pressure causes heart disease and increases the risk of stroke. High blood pressure is more likely to develop in:  People who have blood pressure in the end of the normal range (100-139/85-89 mm Hg).  People who are overweight or obese.  People who are African American.  If you are 103-68 years of age, have your blood pressure checked every 3-5 years. If you are 67 years of age or older, have your blood pressure checked every year. You should have your blood pressure measured twice--once when you are at a hospital or clinic, and once when you are not at a hospital or clinic. Record the average of the two measurements. To check your blood pressure when you are not at a hospital or clinic, you can use:  An automated blood pressure machine at a pharmacy.  A home blood pressure monitor.  If you are 17-41 years old, ask your health care provider if you should take aspirin to prevent heart disease.  Diabetes screening involves taking a blood sample to check your fasting blood sugar level. This should be done once every 3 years after age 28 if you are at a normal weight and without risk factors for diabetes. Testing should be considered at a younger age or be carried out more frequently if you are overweight and have at least 1 risk factor for diabetes.  Colorectal cancer can be detected and often prevented. Most routine colorectal cancer screening begins at the age of 29 and continues through age 21. However, your health care provider may recommend screening at an earlier age if you have risk factors for colon cancer. On a yearly basis, your health care provider may provide home test kits to check for hidden blood in the stool. A small camera at the end of a tube may be used to directly examine the colon (sigmoidoscopy or colonoscopy) to detect the  earliest forms of colorectal cancer. Talk to your health care provider about this at age 55 when routine screening begins. A direct exam of the colon should be repeated every 5-10 years through age 8, unless early forms of precancerous polyps or small growths are found.  People who are at an increased risk for hepatitis B should be screened for this virus. You are considered at high risk for hepatitis B if:  You were born in a country where hepatitis B occurs often. Talk with your health care provider about which countries are considered high risk.  Your parents were born in a high-risk country and you have not received a shot to protect against hepatitis B (hepatitis B vaccine).  You have HIV or AIDS.  You use needles to inject street  drugs.  You live with, or have sex with, someone who has hepatitis B.  You are a man who has sex with other men (MSM).  You get hemodialysis treatment.  You take certain medicines for conditions like cancer, organ transplantation, and autoimmune conditions.  Hepatitis C blood testing is recommended for all people born from [birth year] through 1963 and any individual with known risk factors for hepatitis C.  Healthy men should no longer receive prostate-specific antigen (PSA) blood tests as part of routine cancer screening. Talk to your health care provider about prostate cancer screening.  Testicular cancer screening is not recommended for adolescents or adult males who have no symptoms. Screening includes self-exam, a health care provider exam, and other screening tests. Consult with your health care provider about any symptoms you have or any concerns you have about testicular cancer.  Practice safe sex. Use condoms and avoid high-risk sexual practices to reduce the spread of sexually transmitted infections (STIs).  You should be screened for STIs, including gonorrhea and chlamydia if:  You are sexually active and are younger than 24 years.  You are older  than 24 years, and your health care provider tells you that you are at risk for this type of infection.  Your sexual activity has changed since you were last screened, and you are at an increased risk for chlamydia or gonorrhea. Ask your health care provider if you are at risk.  If you are at risk of being infected with HIV, it is recommended that you take a prescription medicine daily to prevent HIV infection. This is called pre-exposure prophylaxis (PrEP). You are considered at risk if:  You are a man who has sex with other men (MSM).  You are a heterosexual man who is sexually active with multiple partners.  You take drugs by injection.  You are sexually active with a partner who has HIV.  Talk with your health care provider about whether you are at high risk of being infected with HIV. If you choose to begin PrEP, you should first be tested for HIV. You should then be tested every 3 months for as long as you are taking PrEP.  Use sunscreen. Apply sunscreen liberally and repeatedly throughout the day. You should seek shade when your shadow is shorter than you. Protect yourself by wearing long sleeves, pants, a wide-brimmed hat, and sunglasses year round whenever you are outdoors.  Tell your health care provider of new moles or changes in moles, especially if there is a change in shape or color. Also, tell your health care provider if a mole is larger than the size of a pencil eraser.  A one-time screening for abdominal aortic aneurysm (AAA) and surgical repair of large AAAs by ultrasound is recommended for men aged 21-75 years who are current or former smokers.  Stay current with your vaccines (immunizations).   This information is not intended to replace advice given to you by your health care provider. Make sure you discuss any questions you have with your health care provider.   Document Released: 03/29/2008 Document Revised: 10/22/2014 Document Reviewed: 02/26/2011 Elsevier  Interactive Patient Education [employer].

## 2015-09-20 ENCOUNTER — Other Ambulatory Visit: Payer: Self-pay | Admitting: Internal Medicine

## 2015-09-20 NOTE — Progress Notes (Signed)
Medical screening examination/treatment/procedure(s) were performed by non-physician practitioner and as supervising physician I was immediately available for consultation/collaboration. I agree with above. William Hopper, MD   

## 2015-09-23 ENCOUNTER — Encounter: Payer: Self-pay | Admitting: Internal Medicine

## 2015-09-23 ENCOUNTER — Other Ambulatory Visit (INDEPENDENT_AMBULATORY_CARE_PROVIDER_SITE_OTHER): Payer: Commercial Managed Care - HMO

## 2015-09-23 ENCOUNTER — Ambulatory Visit (INDEPENDENT_AMBULATORY_CARE_PROVIDER_SITE_OTHER): Payer: Commercial Managed Care - HMO | Admitting: Internal Medicine

## 2015-09-23 VITALS — BP 112/76 | HR 65 | Temp 98.0°F | Ht 72.0 in | Wt 263.5 lb

## 2015-09-23 DIAGNOSIS — E782 Mixed hyperlipidemia: Secondary | ICD-10-CM

## 2015-09-23 DIAGNOSIS — R7309 Other abnormal glucose: Secondary | ICD-10-CM

## 2015-09-23 DIAGNOSIS — I10 Essential (primary) hypertension: Secondary | ICD-10-CM | POA: Diagnosis not present

## 2015-09-23 DIAGNOSIS — Z86010 Personal history of colonic polyps: Secondary | ICD-10-CM

## 2015-09-23 LAB — CBC WITH DIFFERENTIAL/PLATELET
BASOS PCT: 0.4 % (ref 0.0–3.0)
Basophils Absolute: 0 10*3/uL (ref 0.0–0.1)
EOS PCT: 2 % (ref 0.0–5.0)
Eosinophils Absolute: 0.2 10*3/uL (ref 0.0–0.7)
HCT: 51.4 % (ref 39.0–52.0)
Hemoglobin: 17.3 g/dL — ABNORMAL HIGH (ref 13.0–17.0)
LYMPHS ABS: 1.5 10*3/uL (ref 0.7–4.0)
Lymphocytes Relative: 14.9 % (ref 12.0–46.0)
MCHC: 33.7 g/dL (ref 30.0–36.0)
MCV: 93.7 fl (ref 78.0–100.0)
MONO ABS: 0.9 10*3/uL (ref 0.1–1.0)
Monocytes Relative: 8.6 % (ref 3.0–12.0)
NEUTROS ABS: 7.4 10*3/uL (ref 1.4–7.7)
NEUTROS PCT: 74.1 % (ref 43.0–77.0)
PLATELETS: 196 10*3/uL (ref 150.0–400.0)
RBC: 5.49 Mil/uL (ref 4.22–5.81)
RDW: 13.3 % (ref 11.5–15.5)
WBC: 10 10*3/uL (ref 4.0–10.5)

## 2015-09-23 LAB — BASIC METABOLIC PANEL
BUN: 23 mg/dL (ref 6–23)
CO2: 28 mEq/L (ref 19–32)
Calcium: 9.6 mg/dL (ref 8.4–10.5)
Chloride: 104 mEq/L (ref 96–112)
Creatinine, Ser: 1.18 mg/dL (ref 0.40–1.50)
GFR: 64.48 mL/min (ref >60.00)
GLUCOSE: 137 mg/dL — AB (ref 70–99)
POTASSIUM: 4.4 meq/L (ref 3.5–5.1)
Sodium: 141 mEq/L (ref 135–145)

## 2015-09-23 LAB — TSH: TSH: 2.45 u[IU]/mL (ref 0.35–4.50)

## 2015-09-23 LAB — HEMOGLOBIN A1C: Hgb A1c MFr Bld: 6.9 % — ABNORMAL HIGH (ref 4.6–6.5)

## 2015-09-23 LAB — MICROALBUMIN / CREATININE URINE RATIO
Creatinine,U: 277.4 mg/dL
Microalb Creat Ratio: 0.8 mg/g (ref 0.0–30.0)
Microalb, Ur: 2.1 mg/dL — ABNORMAL HIGH (ref 0.0–1.9)

## 2015-09-23 MED ORDER — TRIAMCINOLONE ACETONIDE 0.1 % EX CREA: TOPICAL_CREAM | Freq: Two times a day (BID) | CUTANEOUS | Status: DC

## 2015-09-23 NOTE — Assessment & Plan Note (Signed)
A1c , urine microalbumin, BMET 

## 2015-09-23 NOTE — Assessment & Plan Note (Addendum)
CBC and dif Colonoscopy 2018

## 2015-09-23 NOTE — Assessment & Plan Note (Signed)
Blood pressure goals reviewed. BMET 

## 2015-09-23 NOTE — Assessment & Plan Note (Signed)
Lipids done in June were reviewed. HDL is low. See AVS

## 2015-09-23 NOTE — Progress Notes (Signed)
   Subjective:    Patient ID: [name], male    DOB: 20-Apr-1941, 74 y.o.   MRN: [id number]  HPI The patient is here to assess status of active health conditions.  PMH, FH, & Social History reviewed & updated.No change in [place] as recorded.  He is on a modified heart healthy diet. He exercises at least 4 days a week using machines, weights, recumbent bike or treadmill. Cardio vascular exercise encompasses at least 40 minutes twice a week. He has no associated cardiopulmonary symptoms  His lipids are up to date; in June of this year the HDL was 37.2.  Colonoscopy was done in 2013. 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 Prior to that in September of this year he had surgery for retinal separation.  Review of systems is also positive for nocturia 2.  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 Both parents had diabetes mellitus in their 31s.  He has 2 alcoholic beverages a night. He has not smoked at all since his heart attack in 2000.  Review of Systems  Chest pain, palpitations, tachycardia, exertional dyspnea, paroxysmal nocturnal dyspnea, claudication or edema are absent. No unexplained weight loss, abdominal pain, significant dyspepsia, dysphagia, melena, rectal bleeding, or persistently small caliber stools. Dysuria, pyuria, hematuria, frequency, or polyuria are denied. Change in hair, skin, nails denied. No bowel changes of constipation or diarrhea. No intolerance to heat or cold.    Objective:   Physical Exam Pertinent or positive findings include: Pattern alopecia is present. He has bilateral ptosis. The nasal septum is deviated to the right. 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 Heart sounds are distant. Ventral hernias noted. Fusiform changes are noted of the knees. Reflexes are 0+ at the knees. Vasectomy scar tissue is noted on the right more than the  left. Prostate is not enlarged; there is some central prominence without induration or nodules.  General appearance :adequately nourished; in no distress.  Eyes: No conjunctival inflammation or scleral icterus is present.  Oral exam:  Lips and gums are healthy appearing.There is no oropharyngeal erythema or exudate noted. Dental hygiene is good.  Heart:  Normal rate and regular rhythm. S1 and S2 normal without gallop, murmur, click, rub or other extra sounds    Lungs:Chest clear to auscultation; no wheezes, rhonchi,rales ,or rubs present.No increased work of breathing.   Abdomen: bowel sounds normal, soft and non-tender without masses, organomegaly or hernias noted.  No guarding or rebound.   Vascular : all pulses equal ; no bruits present.  Skin:Warm & dry.  Intact without suspicious lesions or rashes ; no tenting or jaundice   Lymphatic: No lymphadenopathy is noted about the head, neck, axilla, or inguinal areas.   Neuro: Strength, tone  normal.     Assessment & Plan:  See Current Assessment & Plan in Problem List under specific Diagnosis

## 2015-09-23 NOTE — Progress Notes (Signed)
Pre visit review using our clinic review tool, if applicable. No additional management support is needed unless otherwise documented below in the visit note. 

## 2015-09-23 NOTE — Patient Instructions (Signed)
HDL goal is > 40 in men & > 50 in women.Interventions to raise HDL or GOOD cholesterol include: exercising 30-45 minutes 3-4 X per week ; including dietary sources of Omega 3 fatty acids ( salmon & tuna) or  supplementing with Flax seed  1-2 grams per day. The B vitamin Niacin raises HDL but has not been shown to decrease heart attack or stroke risks in extensive trials & is not recommended.  Your next office appointment will be determined based upon review of your pending labs  . Those written interpretation of the lab results and instructions will be transmitted to you by My Chart Critical results will be called.   Followup as needed for any active or acute issue. Please report any significant change in your symptoms.  Minimal Blood Pressure Goal= AVERAGE < 140/90;  Ideal is an AVERAGE < 135/85. This AVERAGE should be calculated from @ least 5-7 BP readings taken @ different times of day on different days of week. You should not respond to isolated BP readings , but rather the AVERAGE for that week .Please bring your  blood pressure cuff to office visits to verify that it is reliable.It  can also be checked against the blood pressure device at the pharmacy. Finger or wrist cuffs are not dependable; an arm cuff is.

## 2015-10-12 ENCOUNTER — Telehealth: Payer: Self-pay | Admitting: *Deleted

## 2015-10-12 NOTE — Telephone Encounter (Signed)
I am sorry but My Chart must have been malfunctioning;  but I shall have to deliver the results in this format. A1c assesses average 24 hour  glucose over prior 6-12 weeks.  No Diabetes risk if < 6.1%  "Pre Diabetes" :6.2-6.4 % Good diabetic control: 6.5-7 % Fair diabetic control: 7-8 % Poor diabetic control: greater than 8 % ( except with additional factors such as  advanced age; significant coronary or neurologic disease,etc).  Your present value is 6.9 %. An  A1c of 8 % or less  is the safest goal for you.Preferred is < 7 % as long as there are no low blood glucose events.  Urine microalbumin assesses  microscopic kidney disease from hypertension or Diabetes; Diabetes & Hypertension control will prevent or reverse any progression. The following nutritional changes may help prevent Diabetes progression & complications.  White carbohydrates (potatoes, rice, bread, and pasta) cause a high spike of the sugar level which stays elevated for a significant period of time (called sugar"load").  For example a  baked potato has a cup of sugar and a  french fry  2 teaspoons of sugar.  More complex carbs such as yams, wild  rice, whole grained bread &  wheat pasta have been much lower spike and persistent load of sugar than the white carbs. The pancreas excretes excess insulin in response to the high spike & load of sugar . Over time the pancreas can actually run out of insulin necessitating insulin shots. Recheck A1c in 3-4 months.  All other lab results are excellent. [employer]

## 2015-10-12 NOTE — Telephone Encounter (Signed)
Results mailed to pt and sent through MyChart message

## 2015-10-12 NOTE — Telephone Encounter (Signed)
Pt called requesting labs results from 12/9. Per chart lab was release to mychart but their is no MD recommendation attach. Pls advise...Johny Chess

## 2015-10-19 ENCOUNTER — Other Ambulatory Visit: Payer: Self-pay | Admitting: Internal Medicine

## 2015-11-10 DIAGNOSIS — D3132 Benign neoplasm of left choroid: Secondary | ICD-10-CM | POA: Diagnosis not present

## 2015-11-10 DIAGNOSIS — H524 Presbyopia: Secondary | ICD-10-CM | POA: Diagnosis not present

## 2015-11-10 DIAGNOSIS — H2512 Age-related nuclear cataract, left eye: Secondary | ICD-10-CM | POA: Diagnosis not present

## 2015-11-10 DIAGNOSIS — Z961 Presence of intraocular lens: Secondary | ICD-10-CM | POA: Diagnosis not present

## 2015-11-14 ENCOUNTER — Other Ambulatory Visit: Payer: Self-pay | Admitting: Internal Medicine

## 2015-11-14 DIAGNOSIS — R7309 Other abnormal glucose: Secondary | ICD-10-CM

## 2015-11-14 DIAGNOSIS — L438 Other lichen planus: Secondary | ICD-10-CM | POA: Insufficient documentation

## 2015-11-20 ENCOUNTER — Other Ambulatory Visit: Payer: Self-pay | Admitting: Family

## 2015-12-03 DIAGNOSIS — M5136 Other intervertebral disc degeneration, lumbar region: Secondary | ICD-10-CM | POA: Diagnosis not present

## 2015-12-11 ENCOUNTER — Other Ambulatory Visit: Payer: Self-pay | Admitting: Family

## 2015-12-16 ENCOUNTER — Telehealth: Payer: Self-pay | Admitting: *Deleted

## 2015-12-16 MED ORDER — ROSUVASTATIN CALCIUM 40 MG PO TABS: 40.0000 mg | ORAL_TABLET | Freq: Every day | ORAL | Status: DC

## 2015-12-16 NOTE — Telephone Encounter (Signed)
Receive call pt states he is needing refill on her generic crestor. Inform pt he need to estab w/new provider before refills can be sent. Made apt for 03/19/16 w/Dr. Quay Burow. Sent rx to [facility]...[name]

## 2016-02-01 ENCOUNTER — Other Ambulatory Visit (INDEPENDENT_AMBULATORY_CARE_PROVIDER_SITE_OTHER): Payer: PPO

## 2016-02-01 DIAGNOSIS — R7309 Other abnormal glucose: Secondary | ICD-10-CM | POA: Diagnosis not present

## 2016-02-01 DIAGNOSIS — E119 Type 2 diabetes mellitus without complications: Secondary | ICD-10-CM

## 2016-02-01 LAB — MICROALBUMIN / CREATININE URINE RATIO
CREATININE, U: 147.9 mg/dL
MICROALB/CREAT RATIO: 0.5 mg/g (ref 0.0–30.0)
Microalb, Ur: 0.8 mg/dL (ref 0.0–1.9)

## 2016-02-01 LAB — HEMOGLOBIN A1C: Hgb A1c MFr Bld: 8.4 % — ABNORMAL HIGH (ref 4.6–6.5)

## 2016-02-02 ENCOUNTER — Encounter: Payer: Self-pay | Admitting: Internal Medicine

## 2016-02-02 DIAGNOSIS — N1831 Chronic kidney disease, stage 3a: Secondary | ICD-10-CM | POA: Insufficient documentation

## 2016-02-02 DIAGNOSIS — E119 Type 2 diabetes mellitus without complications: Secondary | ICD-10-CM | POA: Insufficient documentation

## 2016-03-19 ENCOUNTER — Ambulatory Visit (INDEPENDENT_AMBULATORY_CARE_PROVIDER_SITE_OTHER): Payer: PPO | Admitting: Internal Medicine

## 2016-03-19 ENCOUNTER — Encounter: Payer: Self-pay | Admitting: Internal Medicine

## 2016-03-19 VITALS — BP 140/76 | HR 67 | Temp 98.4°F | Resp 16 | Ht 72.0 in | Wt 266.0 lb

## 2016-03-19 DIAGNOSIS — I2510 Atherosclerotic heart disease of native coronary artery without angina pectoris: Secondary | ICD-10-CM | POA: Diagnosis not present

## 2016-03-19 DIAGNOSIS — I10 Essential (primary) hypertension: Secondary | ICD-10-CM

## 2016-03-19 DIAGNOSIS — E119 Type 2 diabetes mellitus without complications: Secondary | ICD-10-CM | POA: Diagnosis not present

## 2016-03-19 DIAGNOSIS — E782 Mixed hyperlipidemia: Secondary | ICD-10-CM | POA: Diagnosis not present

## 2016-03-19 NOTE — Assessment & Plan Note (Signed)
Asymptomatic - no chest pain, good exercise tolerance Sees Dr Burt Knack next week Continue current medications

## 2016-03-19 NOTE — Patient Instructions (Addendum)

## 2016-03-19 NOTE — Assessment & Plan Note (Signed)
BP slightly elevated here today Continue current medications He will work on weight loss Exercising, eating healthy

## 2016-03-19 NOTE — Assessment & Plan Note (Signed)
Check lipid panel. Continue crestor. 

## 2016-03-19 NOTE — Progress Notes (Signed)
Pre visit review using our clinic review tool, if applicable. No additional management support is needed unless otherwise documented below in the visit note. 

## 2016-03-19 NOTE — Progress Notes (Signed)
Subjective:    Patient ID: [name], male    DOB: 1941/05/08, 75 y.o.   MRN: [id number]  HPI He is here to establish with a new pcp.   He is here for follow up.  CAD, Hypertension: He is taking his medication daily. He is compliant with a low sodium diet.  He denies chest pain, palpitations, edema, shortness of breath and regular headaches. He is exercising regularly.      Hyperlipidemia: He is taking his medication daily. He is compliant with a low fat/cholesterol diet. He is exercising regularly. He denies myalgias.   Diabetes: He is controlling his sugars with lifestyle, but his last a1c was 8.4%. He is compliant with a diabetic diet. He is exercising regularly.  He knows he needs to lose weight.     Medications and allergies reviewed with patient and updated if appropriate.  Patient Active Problem List   Diagnosis Date Noted  . Diabetes ([place]) 02/02/2016  . Oral lichen planus 123456  . Hx of colonic polyp 09/30/2013  . Essential hypertension 09/13/2010  . LICHEN PLANUS, MOUTH 09/13/2010  . Seasonal allergic rhinitis 02/17/2010  . GILBERT'S SYNDROME 07/25/2009  . CORONARY ATHEROSCLEROSIS NATIVE CORONARY ARTERY 06/06/2009  . HYPERLIPIDEMIA 07/28/2007  . 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 aspirin EC 81 MG tablet Take 81 mg by mouth daily.    . clobetasol (TEMOVATE) 0.05 % GEL APP TO THE INSIDE OF MOUTH DAILY PRN  1  . co-enzyme Q-10 30 MG capsule Take 100 mg by mouth as needed.    . hydrochlorothiazide (HYDRODIURIL) 25 MG tablet TAKE 1 TABLET BY MOUTH DAILY 90 tablet 3  . ibuprofen (ADVIL,MOTRIN) 200 MG tablet Take 400 mg by mouth daily. Three 200mg  tabs A DAY    . losartan (COZAAR) 100 MG tablet TAKE 1 TABLET(100 MG) BY MOUTH DAILY 180 tablet 1  . metoprolol tartrate (LOPRESSOR) 25 MG tablet TAKE 1 TABLET BY MOUTH TWICE DAILY 180 tablet 1  . Multiple  Vitamin (MULTIVITAMIN) tablet Take 1 tablet by mouth daily.      . rosuvastatin (CRESTOR) 40 MG tablet Take 1 tablet (40 mg total) by mouth daily. Must keep 03/19/16 appt for future refills 30 tablet 3  . triamcinolone cream (KENALOG) 0.1 % Apply topically 2 (two) times daily. (Patient taking differently: Apply topically as needed. ) 30 g 2   No current facility-administered medications on file prior to visit.    Past Medical History  Diagnosis Date  . 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 Other abnormal glucose   . Coronary artery disease   . Hyperlipidemia   . Hypertension   . Lichen planus     oral    Past Surgical History  Procedure Laterality Date  . Coronary angioplasty with stent placement  2000  . Appendectomy    . Cholecystectomy    . Tonsillectomy    . Vasectomy    . Cataract extraction w/ intraocular lens implant Right 2009    Dr Bing Plume  . Refractive surgery  1999    Dr Bing Plume    Social History   Social History  . Marital Status: Married    Spouse Name: N/A  . Number of Children: N/A  . Years of Education: N/A   Occupational History  . retired Pharmacist, community    Social History Main Topics  .  Smoking status: Former Smoker    Quit date: 10/15/1997  . Smokeless tobacco: Not on file     Comment: smoked 1961-1999, up to 1/2 ppd. Off 3-4 years 1961-1999. Cigar 3/week for 1 year  . Alcohol Use: 0.0 oz/week    0 Standard drinks or equivalent per week     Comment: socially  1-2/ day  . Drug Use: No  . Sexual Activity: Yes   Other Topics Concern  . Not on file   Social History Narrative    Family History  Problem Relation Age of Onset  . Coronary artery disease Father     MIx3. Initial MI @ 34  . Diabetes Father   . Stroke Father   . COPD Mother   . Diabetes Mother   . Hypertension Brother     DM- CABG  . Kidney failure Sister     Review of Systems  Constitutional: Negative for fever, appetite change and fatigue.    Respiratory: Negative for cough, shortness of breath and wheezing.   Cardiovascular: Negative for chest pain, palpitations and leg swelling.  Neurological: Negative for dizziness, light-headedness and headaches.       Objective:   Filed Vitals:   03/19/16 0847  BP: 140/76  Pulse: 67  Temp: 98.4 F (36.9 C)  Resp: 16   Filed Weights   03/19/16 0847  Weight: 266 lb (120.657 kg)   Body mass index is 36.07 kg/(m^2).   Physical Exam Constitutional: Appears well-developed and well-nourished. No distress.  Neck: Neck supple. No tracheal deviation present. No thyromegaly present.  No carotid bruit. No cervical adenopathy.   Cardiovascular: Normal rate, regular rhythm and normal heart sounds.   No murmur heard.  No edema Pulmonary/Chest: Effort normal and breath sounds normal. No respiratory distress. No wheezes.       Assessment & Plan:   See Problem List for Assessment and Plan of chronic medical problems.   F/u in 6 months

## 2016-03-22 ENCOUNTER — Other Ambulatory Visit: Payer: Self-pay | Admitting: Internal Medicine

## 2016-03-22 ENCOUNTER — Other Ambulatory Visit (INDEPENDENT_AMBULATORY_CARE_PROVIDER_SITE_OTHER): Payer: PPO

## 2016-03-22 DIAGNOSIS — E782 Mixed hyperlipidemia: Secondary | ICD-10-CM

## 2016-03-22 DIAGNOSIS — E119 Type 2 diabetes mellitus without complications: Secondary | ICD-10-CM

## 2016-03-22 DIAGNOSIS — I2510 Atherosclerotic heart disease of native coronary artery without angina pectoris: Secondary | ICD-10-CM | POA: Diagnosis not present

## 2016-03-22 DIAGNOSIS — I10 Essential (primary) hypertension: Secondary | ICD-10-CM | POA: Diagnosis not present

## 2016-03-22 LAB — COMPREHENSIVE METABOLIC PANEL
ALBUMIN: 4.2 g/dL (ref 3.5–5.2)
ALK PHOS: 50 U/L (ref 39–117)
ALT: 30 U/L (ref 0–53)
AST: 23 U/L (ref 0–37)
BUN: 17 mg/dL (ref 6–23)
CALCIUM: 9.3 mg/dL (ref 8.4–10.5)
CO2: 28 mEq/L (ref 19–32)
Chloride: 101 mEq/L (ref 96–112)
Creatinine, Ser: 1.1 mg/dL (ref 0.40–1.50)
GFR: 69.83 mL/min (ref >60.00)
Glucose, Bld: 198 mg/dL — ABNORMAL HIGH (ref 70–99)
POTASSIUM: 4.2 meq/L (ref 3.5–5.1)
Sodium: 137 mEq/L (ref 135–145)
TOTAL PROTEIN: 6.5 g/dL (ref 6.0–8.3)
Total Bilirubin: 1.1 mg/dL (ref 0.2–1.2)

## 2016-03-22 LAB — LIPID PANEL
Cholesterol: 116 mg/dL (ref 0–200)
HDL: 31.4 mg/dL — AB (ref >39.00)
LDL Cholesterol: 63 mg/dL (ref 0–99)
NONHDL: 84.97
Total CHOL/HDL Ratio: 4
Triglycerides: 108 mg/dL (ref 0.0–149.0)
VLDL: 21.6 mg/dL (ref 0.0–40.0)

## 2016-03-22 LAB — CBC WITH DIFFERENTIAL/PLATELET
BASOS ABS: 0.1 10*3/uL (ref 0.0–0.1)
Basophils Relative: 0.6 % (ref 0.0–3.0)
Eosinophils Absolute: 0.3 10*3/uL (ref 0.0–0.7)
Eosinophils Relative: 3.7 % (ref 0.0–5.0)
HEMATOCRIT: 48.2 % (ref 39.0–52.0)
Hemoglobin: 16.5 g/dL (ref 13.0–17.0)
LYMPHS ABS: 1.7 10*3/uL (ref 0.7–4.0)
LYMPHS PCT: 18.7 % (ref 12.0–46.0)
MCHC: 34.3 g/dL (ref 30.0–36.0)
MCV: 92.8 fl (ref 78.0–100.0)
MONOS PCT: 9 % (ref 3.0–12.0)
Monocytes Absolute: 0.8 10*3/uL (ref 0.1–1.0)
NEUTROS PCT: 68 % (ref 43.0–77.0)
Neutro Abs: 6.2 10*3/uL (ref 1.4–7.7)
PLATELETS: 191 10*3/uL (ref 150.0–400.0)
RBC: 5.19 Mil/uL (ref 4.22–5.81)
RDW: 12.9 % (ref 11.5–15.5)
WBC: 9.1 10*3/uL (ref 4.0–10.5)

## 2016-03-22 LAB — HEMOGLOBIN A1C: HEMOGLOBIN A1C: 8.7 % — AB (ref 4.6–6.5)

## 2016-03-22 LAB — TSH: TSH: 2.26 u[IU]/mL (ref 0.35–4.50)

## 2016-03-23 ENCOUNTER — Other Ambulatory Visit: Payer: Self-pay | Admitting: Emergency Medicine

## 2016-03-23 ENCOUNTER — Telehealth: Payer: Self-pay

## 2016-03-23 MED ORDER — METFORMIN HCL 500 MG PO TABS: ORAL_TABLET | ORAL | Status: DC

## 2016-03-23 NOTE — Telephone Encounter (Signed)
Patient called back. Please call him.

## 2016-03-23 NOTE — Telephone Encounter (Signed)
Spoke with pt. RX sent to [facility]

## 2016-03-23 NOTE — Telephone Encounter (Signed)
Metformin 500 mg sent to [facility] per MDs request.

## 2016-03-26 ENCOUNTER — Ambulatory Visit (INDEPENDENT_AMBULATORY_CARE_PROVIDER_SITE_OTHER): Payer: PPO | Admitting: Cardiovascular Disease

## 2016-03-26 ENCOUNTER — Encounter: Payer: Self-pay | Admitting: Cardiovascular Disease

## 2016-03-26 VITALS — BP 126/78 | HR 58 | Ht 72.0 in | Wt 261.0 lb

## 2016-03-26 DIAGNOSIS — I2510 Atherosclerotic heart disease of native coronary artery without angina pectoris: Secondary | ICD-10-CM | POA: Diagnosis not present

## 2016-03-26 DIAGNOSIS — I10 Essential (primary) hypertension: Secondary | ICD-10-CM

## 2016-03-26 NOTE — Progress Notes (Signed)
Cardiology Office Note Date:  03/26/2016   ID:  [name], DOB [birth year], MRN [id number]  PCP:  [name], MD  Cardiologist:  [name], MD    Chief Complaint  Patient presents with  . Follow-up    CAD     History of Present Illness: [name] is a 75 y.o. male who presents for follow-up of coronary artery disease. The patient initially presented in 2000 with an acute inferior wall MI. His most recent PCI procedure was in 2001 when he was treated with balloon angioplasty of severe in-stent restenosis in the right coronary artery. Other medical problems include hypertension and hyperlipidemia. He was last seen 01/20/2015.  The patient is been diagnosed with type 2 diabetes. His hemoglobin A1c was 8.7. He is now taking metformin 500 mg twice daily and seems to be tolerating this well. He just started 1 week ago. He remains physically active. He is exercising regularly with lifting light weights, using a recumbent bike, and walking on the treadmill up to 40 minutes. He denies any exertional symptoms with his exercise. He specifically denies chest pain, shortness of breath, leg swelling, palpitations, or lightheadedness. He does experience some dyspnea with mowing the grass on his 17 acre lot.  Past Medical History  Diagnosis Date  . 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 Other abnormal glucose   . Coronary artery disease   . Hyperlipidemia   . Hypertension   . Lichen planus     oral    Past Surgical History  Procedure Laterality Date  . Coronary angioplasty with stent placement  2000  . Appendectomy    . Cholecystectomy    . Tonsillectomy    . Vasectomy    . Cataract extraction w/ intraocular lens implant Right 2009    Dr Bing Plume  . Refractive surgery  1999    Dr Bing Plume    Current Outpatient Prescriptions  Medication Sig Dispense Refill  . aspirin EC 81 MG tablet Take 81 mg by mouth daily.    . clobetasol (TEMOVATE) 0.05 %  GEL Apply to the inside of mouth daily as need.    Marland Kitchen co-enzyme Q-10 30 MG capsule Take 100 mg by mouth as needed.    . hydrochlorothiazide (HYDRODIURIL) 25 MG tablet TAKE 1 TABLET BY MOUTH DAILY 90 tablet 3  . ibuprofen (ADVIL,MOTRIN) 200 MG tablet Take 200 mg by mouth 2 (two) times daily.     Marland Kitchen losartan (COZAAR) 100 MG tablet TAKE 1 TABLET(100 MG) BY MOUTH DAILY 180 tablet 1  . metFORMIN (GLUCOPHAGE) 500 MG tablet Take 2 tablets per day. 1 tablet with breakfast, 1 tablet with dinner. 180 tablet 1  . metoprolol tartrate (LOPRESSOR) 25 MG tablet TAKE 1 TABLET BY MOUTH TWICE DAILY 180 tablet 1  . Multiple Vitamin (MULTIVITAMIN) tablet Take 1 tablet by mouth daily.      . rosuvastatin (CRESTOR) 40 MG tablet Take 1 tablet (40 mg total) by mouth daily. Must keep 03/19/16 appt for future refills 30 tablet 3  . triamcinolone cream (KENALOG) 0.1 % Apply 1 application topically daily as needed. Reported on 03/26/2016     No current facility-administered medications for this visit.    Allergies:   Ramipril   Social History:  The patient  reports that he quit smoking about 18 years ago. He does not have any smokeless tobacco history on file. He reports that he drinks alcohol. He reports that he does not use  illicit drugs.   Family History:  The patient's  family history includes COPD in his mother; Coronary artery disease in his father; Diabetes in his father and mother; Hypertension in his brother; Kidney failure in his sister; Stroke in his father.    ROS:  Please see the history of present illness.  Otherwise, review of systems is positive for Back pain, visual disturbance.  All other systems are reviewed and negative.    PHYSICAL EXAM: VS:  BP 126/78 mmHg  Pulse 58  Ht 6' (1.829 m)  Wt 261 lb (118.389 kg)  BMI 35.39 kg/m2 , BMI Body mass index is 35.39 kg/(m^2). GEN: Well nourished, well developed, in no acute distress HEENT: normal Neck: no JVD, no masses. No carotid bruits Cardiac: RRR  without murmur or gallop                Respiratory:  clear to auscultation bilaterally, normal work of breathing GI: soft, nontender, nondistended, + BS MS: no deformity or atrophy Ext: no pretibial edema, pedal pulses 2+= bilaterally Skin: warm and dry, no rash Neuro:  Strength and sensation are intact Psych: euthymic mood, full affect  EKG:  EKG is ordered today. The ekg ordered today shows sinus bradycardia 58 bpm, incomplete right bundle branch block, age indeterminate inferior infarct.  Recent Labs: 03/22/2016: ALT 30; BUN 17; Creatinine, Ser 1.10; Hemoglobin 16.5; Platelets 191.0; Potassium 4.2; Sodium 137; TSH 2.26   Lipid Panel     Component Value Date/Time   CHOL 116 03/22/2016 0736   TRIG 108.0 03/22/2016 0736   HDL 31.40* 03/22/2016 0736   CHOLHDL 4 03/22/2016 0736   VLDL 21.6 03/22/2016 0736   LDLCALC 63 03/22/2016 0736      Wt Readings from Last 3 Encounters:  03/26/16 261 lb (118.389 kg)  03/19/16 266 lb (120.657 kg)  09/23/15 263 lb 8 oz (119.523 kg)    ASSESSMENT AND PLAN: 1.  CAD, native vessel: No symptoms of angina. Medical program reviewed and includes aspirin, a statin drug, and a beta blocker.  2. Essential hypertension: Patient has been treated with a combination of losartan, hydrochlorothiazide, and metoprolol. Blood pressure is well controlled.  3. Hyperlipidemia: Patient treated with Crestor 40 mg. lipids reviewed and at goal  4. Obesity: Lengthy discussion about diet and weight loss strategies. He understands the impact of weight loss on type 2 diabetes.  5. Type 2 diabetes: Started on metformin. Followed closely by Dr. Quay Burow. Again, discussed lifestyle modification at length.  Current medicines are reviewed with the patient today.  The patient does not have concerns regarding medicines.  Labs/ tests ordered today include:   Orders Placed This Encounter  Procedures  . EKG 12-Lead    Disposition:   FU one year  Signed, [name],  MD  03/26/2016 3:55 PM    [address], [place], [place]  [address] Phone: [phone]; Fax: [phone]

## 2016-03-26 NOTE — Patient Instructions (Signed)

## 2016-05-10 ENCOUNTER — Other Ambulatory Visit: Payer: Self-pay | Admitting: Nurse Practitioner

## 2016-08-20 DIAGNOSIS — L280 Lichen simplex chronicus: Secondary | ICD-10-CM | POA: Diagnosis not present

## 2016-08-20 DIAGNOSIS — L570 Actinic keratosis: Secondary | ICD-10-CM | POA: Diagnosis not present

## 2016-08-20 DIAGNOSIS — L820 Inflamed seborrheic keratosis: Secondary | ICD-10-CM | POA: Diagnosis not present

## 2016-08-20 DIAGNOSIS — D1801 Hemangioma of skin and subcutaneous tissue: Secondary | ICD-10-CM | POA: Diagnosis not present

## 2016-08-20 DIAGNOSIS — L821 Other seborrheic keratosis: Secondary | ICD-10-CM | POA: Diagnosis not present

## 2016-08-20 DIAGNOSIS — D485 Neoplasm of uncertain behavior of skin: Secondary | ICD-10-CM | POA: Diagnosis not present

## 2016-08-20 DIAGNOSIS — C44319 Basal cell carcinoma of skin of other parts of face: Secondary | ICD-10-CM | POA: Diagnosis not present

## 2016-08-27 DIAGNOSIS — Z85828 Personal history of other malignant neoplasm of skin: Secondary | ICD-10-CM | POA: Diagnosis not present

## 2016-08-27 DIAGNOSIS — C44319 Basal cell carcinoma of skin of other parts of face: Secondary | ICD-10-CM | POA: Diagnosis not present

## 2016-08-28 ENCOUNTER — Other Ambulatory Visit: Payer: Self-pay | Admitting: Cardiovascular Disease

## 2016-09-18 ENCOUNTER — Ambulatory Visit (INDEPENDENT_AMBULATORY_CARE_PROVIDER_SITE_OTHER): Payer: PPO | Admitting: Internal Medicine

## 2016-09-18 ENCOUNTER — Other Ambulatory Visit (INDEPENDENT_AMBULATORY_CARE_PROVIDER_SITE_OTHER): Payer: PPO

## 2016-09-18 ENCOUNTER — Encounter: Payer: Self-pay | Admitting: Internal Medicine

## 2016-09-18 VITALS — BP 130/80 | HR 60 | Wt 258.0 lb

## 2016-09-18 DIAGNOSIS — C44310 Basal cell carcinoma of skin of unspecified parts of face: Secondary | ICD-10-CM | POA: Insufficient documentation

## 2016-09-18 DIAGNOSIS — E119 Type 2 diabetes mellitus without complications: Secondary | ICD-10-CM

## 2016-09-18 DIAGNOSIS — I2510 Atherosclerotic heart disease of native coronary artery without angina pectoris: Secondary | ICD-10-CM | POA: Diagnosis not present

## 2016-09-18 DIAGNOSIS — E782 Mixed hyperlipidemia: Secondary | ICD-10-CM | POA: Diagnosis not present

## 2016-09-18 DIAGNOSIS — I10 Essential (primary) hypertension: Secondary | ICD-10-CM

## 2016-09-18 LAB — COMPREHENSIVE METABOLIC PANEL
ALT: 24 U/L (ref 0–53)
AST: 18 U/L (ref 0–37)
Albumin: 4.3 g/dL (ref 3.5–5.2)
Alkaline Phosphatase: 49 U/L (ref 39–117)
BUN: 19 mg/dL (ref 6–23)
CO2: 28 mEq/L (ref 19–32)
Calcium: 9.8 mg/dL (ref 8.4–10.5)
Chloride: 102 mEq/L (ref 96–112)
Creatinine, Ser: 1.19 mg/dL (ref 0.40–1.50)
GFR: 63.68 mL/min (ref >60.00)
Glucose, Bld: 170 mg/dL — ABNORMAL HIGH (ref 70–99)
Potassium: 4.8 mEq/L (ref 3.5–5.1)
Sodium: 138 mEq/L (ref 135–145)
Total Bilirubin: 1.2 mg/dL (ref 0.2–1.2)
Total Protein: 6.7 g/dL (ref 6.0–8.3)

## 2016-09-18 LAB — LIPID PANEL
Cholesterol: 111 mg/dL (ref 0–200)
HDL: 39.2 mg/dL (ref >39.00)
LDL Cholesterol: 55 mg/dL (ref 0–99)
NonHDL: 71.54
Total CHOL/HDL Ratio: 3
Triglycerides: 82 mg/dL (ref 0.0–149.0)
VLDL: 16.4 mg/dL (ref 0.0–40.0)

## 2016-09-18 LAB — HEMOGLOBIN A1C: Hgb A1c MFr Bld: 7.4 % — ABNORMAL HIGH (ref 4.6–6.5)

## 2016-09-18 NOTE — Assessment & Plan Note (Signed)
Check lipid panel  - has been well controlled Continue daily statin Regular exercise and healthy diet encouraged

## 2016-09-18 NOTE — Assessment & Plan Note (Addendum)
Eye - Dr Bing Plume - will get report Tolerating metformin Eating low sugar/carb diet Exercising Check a1c

## 2016-09-18 NOTE — Patient Instructions (Addendum)
  Test(s) ordered today. Your results will be released to [place] (or called to you) after review, usually within 72hours after test completion. If any changes need to be made, you will be notified at that same time.  All other Health Maintenance issues reviewed.   All recommended immunizations and age-appropriate screenings are up-to-date or discussed.  No immunizations administered today.   Medications reviewed and updated.   No changes recommended at this time.  Your prescription(s) have been submitted to your pharmacy. Please take as directed and contact our office if you believe you are having problem(s) with the medication(s).   Please followup in 6 months for a physical exam.

## 2016-09-18 NOTE — Assessment & Plan Note (Signed)
BP well controlled Current regimen effective and well tolerated Continue current medications at current doses cmp  

## 2016-09-18 NOTE — Progress Notes (Signed)
Subjective:    Patient ID: [name], male    DOB: 01-31-1941, 75 y.o.   MRN: [id number]  HPI He is here for follow up.  Diabetes: He is taking his medication daily as prescribed. He is compliant with a diabetic diet. He is exercising regularly. He checks his feet daily and denies foot lesions. He is up-to-date with an ophthalmology examination.   Hypertension: He is taking his medication daily. He is compliant with a low sodium diet.  He denies chest pain, palpitations, edema, shortness of breath and regular headaches. He is exercising regularly.  He does not monitor his blood pressure at home.    Hyperlipidemia: He is taking his medication daily. He is compliant with a low fat/cholesterol diet. He is exercising regularly. He denies myalgias.    Medications and allergies reviewed with patient and updated if appropriate.  Patient Active Problem List   Diagnosis Date Noted  . Basal cell carcinoma of face 09/18/2016  . Diabetes ([place]) 02/02/2016  . Oral lichen planus 123456  . Hx of colonic polyp 09/30/2013  . Essential hypertension 09/13/2010  . LICHEN PLANUS, MOUTH 09/13/2010  . Seasonal allergic rhinitis 02/17/2010  . GILBERT'S SYNDROME 07/25/2009  . CORONARY ATHEROSCLEROSIS NATIVE CORONARY ARTERY 06/06/2009  . HYPERLIPIDEMIA 07/28/2007  . 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 aspirin EC 81 MG tablet Take 81 mg by mouth daily.    . clobetasol (TEMOVATE) 0.05 % GEL Apply to the inside of mouth daily as need.    Marland Kitchen co-enzyme Q-10 30 MG capsule Take 100 mg by mouth as needed.    . hydrochlorothiazide (HYDRODIURIL) 25 MG tablet TAKE 1 TABLET BY MOUTH ONCE DAILY 90 tablet 3  . ibuprofen (ADVIL,MOTRIN) 200 MG tablet Take 200 mg by mouth 2 (two) times daily.     Marland Kitchen losartan (COZAAR) 100 MG tablet TAKE 1 TABLET(100 MG) BY MOUTH DAILY 180 tablet 1  . metFORMIN  (GLUCOPHAGE) 500 MG tablet Take 2 tablets per day. 1 tablet with breakfast, 1 tablet with dinner. 180 tablet 1  . metoprolol tartrate (LOPRESSOR) 25 MG tablet TAKE 1 TABLET BY MOUTH TWICE DAILY 180 tablet 0  . Multiple Vitamin (MULTIVITAMIN) tablet Take 1 tablet by mouth daily.      . rosuvastatin (CRESTOR) 40 MG tablet Take 1 tablet (40 mg total) by mouth daily. Must keep 03/19/16 appt for future refills 30 tablet 3  . triamcinolone cream (KENALOG) 0.1 % Apply 1 application topically daily as needed. Reported on 03/26/2016     No current facility-administered medications on file prior to visit.     Past Medical History:  Diagnosis Date  . Coronary artery disease   . Hyperlipidemia   . 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 Hypertension   . Lichen planus    oral  . Other abnormal glucose     Past Surgical History:  Procedure Laterality Date  . APPENDECTOMY    . CATARACT EXTRACTION W/ INTRAOCULAR LENS IMPLANT Right 2009   Dr Bing Plume  . CHOLECYSTECTOMY    . CORONARY ANGIOPLASTY WITH STENT PLACEMENT  2000  . REFRACTIVE SURGERY  1999   Dr Bing Plume  . TONSILLECTOMY    . VASECTOMY      Social History   Social History  . Marital status: Married    Spouse name: N/A  . Number of children: N/A  .  Years of education: N/A   Occupational History  . retired Pharmacist, community Retired   Social History Main Topics  . Smoking status: Former Smoker    Quit date: 10/15/1997  . Smokeless tobacco: None     Comment: smoked 1961-1999, up to 1/2 ppd. Off 3-4 years 1961-1999. Cigar 3/week for 1 year  . Alcohol use 0.0 oz/week     Comment: socially  1-2/ day  . Drug use: No  . Sexual activity: Yes   Other Topics Concern  . None   Social History Narrative  . None    Family History  Problem Relation Age of Onset  . Coronary artery disease Father     MIx3. Initial MI @ 39  . Diabetes Father   . Stroke Father   . COPD Mother   . Diabetes Mother   . Hypertension  Brother     DM- CABG  . Kidney failure Sister     Review of Systems  Constitutional: Negative for fever.  Respiratory: Negative for cough, shortness of breath and wheezing.   Cardiovascular: Negative for chest pain, palpitations and leg swelling.  Musculoskeletal: Negative for myalgias.  Neurological: Negative for light-headedness and headaches.       Objective:   Vitals:   09/18/16 0845  BP: 130/80  Pulse: 60   Filed Weights   09/18/16 0845  Weight: 258 lb (117 kg)   Body mass index is 34.99 kg/m.   Physical Exam Constitutional: Appears well-developed and well-nourished. No distress.  HENT:  Head: Normocephalic and atraumatic.  Neck: Neck supple. No tracheal deviation present. No thyromegaly present.  No cervical lymphadenopathy Cardiovascular: Normal rate, regular rhythm and normal heart sounds.   No murmur heard. No carotid bruit .  No edema Pulmonary/Chest: Effort normal and breath sounds normal. No respiratory distress. No has no wheezes. No rales.  Skin: Skin is warm and dry. Not diaphoretic.  Psychiatric: Normal mood and affect. Behavior is normal.         Assessment & Plan:   See Problem List for Assessment and Plan of chronic medical problems.  F/u in 6 months

## 2016-09-18 NOTE — Assessment & Plan Note (Signed)
No angina  Following with Dr Burt Knack Continue ASA, statin, BB

## 2016-09-20 ENCOUNTER — Encounter: Payer: Self-pay | Admitting: Internal Medicine

## 2016-10-10 ENCOUNTER — Other Ambulatory Visit: Payer: Self-pay | Admitting: *Deleted

## 2016-10-10 ENCOUNTER — Other Ambulatory Visit: Payer: Self-pay | Admitting: Internal Medicine

## 2016-10-10 MED ORDER — ROSUVASTATIN CALCIUM 40 MG PO TABS
40.0000 mg | ORAL_TABLET | Freq: Every day | ORAL | 1 refills | Status: DC
Start: 2016-10-10 — End: 2017-12-04

## 2016-11-12 DIAGNOSIS — H5211 Myopia, right eye: Secondary | ICD-10-CM | POA: Diagnosis not present

## 2016-11-12 DIAGNOSIS — H52221 Regular astigmatism, right eye: Secondary | ICD-10-CM | POA: Diagnosis not present

## 2016-11-12 DIAGNOSIS — H5212 Myopia, left eye: Secondary | ICD-10-CM | POA: Diagnosis not present

## 2016-11-12 DIAGNOSIS — H524 Presbyopia: Secondary | ICD-10-CM | POA: Diagnosis not present

## 2016-11-23 ENCOUNTER — Other Ambulatory Visit: Payer: Self-pay | Admitting: Family

## 2017-01-09 ENCOUNTER — Other Ambulatory Visit: Payer: Self-pay | Admitting: Internal Medicine

## 2017-02-25 ENCOUNTER — Other Ambulatory Visit: Payer: Self-pay | Admitting: Cardiovascular Disease

## 2017-04-03 DIAGNOSIS — H2512 Age-related nuclear cataract, left eye: Secondary | ICD-10-CM | POA: Diagnosis not present

## 2017-04-03 DIAGNOSIS — Z961 Presence of intraocular lens: Secondary | ICD-10-CM | POA: Diagnosis not present

## 2017-04-03 DIAGNOSIS — L719 Rosacea, unspecified: Secondary | ICD-10-CM | POA: Diagnosis not present

## 2017-04-22 ENCOUNTER — Other Ambulatory Visit: Payer: Self-pay | Admitting: Cardiovascular Disease

## 2017-04-26 ENCOUNTER — Encounter: Payer: Self-pay | Admitting: Cardiovascular Disease

## 2017-04-26 ENCOUNTER — Ambulatory Visit (INDEPENDENT_AMBULATORY_CARE_PROVIDER_SITE_OTHER): Payer: PPO | Admitting: Cardiovascular Disease

## 2017-04-26 VITALS — BP 130/76 | HR 60 | Ht 72.0 in | Wt 245.1 lb

## 2017-04-26 DIAGNOSIS — E119 Type 2 diabetes mellitus without complications: Secondary | ICD-10-CM | POA: Diagnosis not present

## 2017-04-26 DIAGNOSIS — I2510 Atherosclerotic heart disease of native coronary artery without angina pectoris: Secondary | ICD-10-CM | POA: Diagnosis not present

## 2017-04-26 DIAGNOSIS — I10 Essential (primary) hypertension: Secondary | ICD-10-CM

## 2017-04-26 LAB — COMPREHENSIVE METABOLIC PANEL
A/G RATIO: 2.1 (ref 1.2–2.2)
ALT: 22 IU/L (ref 0–44)
AST: 20 IU/L (ref 0–40)
Albumin: 4.5 g/dL (ref 3.5–4.8)
Alkaline Phosphatase: 67 IU/L (ref 39–117)
BILIRUBIN TOTAL: 1.1 mg/dL (ref 0.0–1.2)
BUN / CREAT RATIO: 16 (ref 10–24)
BUN: 18 mg/dL (ref 8–27)
CHLORIDE: 98 mmol/L (ref 96–106)
CO2: 23 mmol/L (ref 20–29)
Calcium: 9.8 mg/dL (ref 8.6–10.2)
Creatinine, Ser: 1.16 mg/dL (ref 0.76–1.27)
GFR calc non Af Amer: 62 mL/min/{1.73_m2} (ref >59)
GFR, EST AFRICAN AMERICAN: 72 mL/min/{1.73_m2} (ref >59)
GLUCOSE: 275 mg/dL — AB (ref 65–99)
Globulin, Total: 2.1 g/dL (ref 1.5–4.5)
POTASSIUM: 4.3 mmol/L (ref 3.5–5.2)
Sodium: 137 mmol/L (ref 134–144)
Total Protein: 6.6 g/dL (ref 6.0–8.5)

## 2017-04-26 LAB — LIPID PANEL
CHOLESTEROL TOTAL: 94 mg/dL — AB (ref 100–199)
Chol/HDL Ratio: 2.9 ratio (ref 0.0–5.0)
HDL: 32 mg/dL — AB (ref >39)
LDL Calculated: 40 mg/dL (ref 0–99)
Triglycerides: 109 mg/dL (ref 0–149)
VLDL CHOLESTEROL CAL: 22 mg/dL (ref 5–40)

## 2017-04-26 NOTE — Patient Instructions (Signed)
Medication Instructions:  Your physician recommends that you continue on your current medications as directed. Please refer to the Current Medication list given to you today.  Labwork: Your physician recommends that you have lab work today: CMP, LIPID and HgbA1c  Testing/Procedures: No new orders.   Follow-Up: Your physician wants you to follow-up in: 1 YEAR with Dr Burt Knack.  You will receive a reminder letter in the mail two months in advance. If you don't receive a letter, please call our office to schedule the follow-up appointment.   Any Other Special Instructions Will Be Listed Below (If Applicable).     If you need a refill on your cardiac medications before your next appointment, please call your pharmacy.

## 2017-04-26 NOTE — Progress Notes (Signed)
Cardiology Office Note Date:  04/26/2017   ID:  [name], DOB [birth year], MRN [id number]  PCP:  [name], MD  Cardiologist:  [name], MD    Chief Complaint  Patient presents with  . 1 year follow up     History of Present Illness: [name] is a 76 y.o. male who presents for follow-up of coronary artery disease. The patient initially presented in 2000 with an acute inferior wall MI. His most recent PCI procedure was in 2001 when he was treated with balloon angioplasty of severe in-stent restenosis in the right coronary artery. Other medical problems include hypertension and hyperlipidemia. Cardiac catheterization note from 2001 describes moderate coronary disease involving the LAD.  The patient is here alone today. He feels well and has no complaints. He exercises at the gym 3 days per week for 30 minutes. No exertional symptoms. Today, he denies symptoms of palpitations, chest pain, shortness of breath, orthopnea, PND, lower extremity edema, dizziness, or syncope.  Past Medical History:  Diagnosis Date  . Coronary artery disease   . Hyperlipidemia   . 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 Hypertension   . Lichen planus    oral  . Other abnormal glucose     Past Surgical History:  Procedure Laterality Date  . APPENDECTOMY    . CATARACT EXTRACTION W/ INTRAOCULAR LENS IMPLANT Right 2009   Dr Bing Plume  . CHOLECYSTECTOMY    . CORONARY ANGIOPLASTY WITH STENT PLACEMENT  2000  . REFRACTIVE SURGERY  1999   Dr Bing Plume  . TONSILLECTOMY    . VASECTOMY      Current Outpatient Prescriptions  Medication Sig Dispense Refill  . aspirin EC 81 MG tablet Take 81 mg by mouth daily.    Marland Kitchen augmented betamethasone dipropionate (DIPROLENE-AF) 0.05 % cream Apply 1 application topically as directed.     . clobetasol (TEMOVATE) 0.05 % GEL Apply to the inside of mouth daily as need.    Marland Kitchen co-enzyme Q-10 30 MG capsule Take 100 mg by mouth as needed  (supplement).     . hydrochlorothiazide (HYDRODIURIL) 25 MG tablet TAKE 1 TABLET BY MOUTH ONCE DAILY 90 tablet 3  . ibuprofen (ADVIL,MOTRIN) 200 MG tablet Take 200 mg by mouth 2 (two) times daily.     Marland Kitchen losartan (COZAAR) 100 MG tablet TAKE 1 TABLET BY MOUTH DAILY 90 tablet 1  . metFORMIN (GLUCOPHAGE) 500 MG tablet Take 1 tablet by mouth with breakfast and 1 tablet with dinner. --Office visit needed for further refills. 180 tablet 0  . metoprolol tartrate (LOPRESSOR) 25 MG tablet TAKE 1 TABLET BY MOUTH TWICE DAILY 60 tablet 0  . Multiple Vitamin (MULTIVITAMIN) tablet Take 1 tablet by mouth daily.      . rosuvastatin (CRESTOR) 40 MG tablet Take 1 tablet (40 mg total) by mouth daily. 90 tablet 1  . triamcinolone cream (KENALOG) 0.1 % Apply 1 application topically daily as needed (skin). Reported on 03/26/2016      No current facility-administered medications for this visit.     Allergies:   Ramipril   Social History:  The patient  reports that he quit smoking about 19 years ago. He has never used smokeless tobacco. He reports that he drinks alcohol. He reports that he does not use drugs.   Family History:  The patient's  family history includes COPD in his mother; Coronary artery disease in his father; Diabetes in his father and mother; Hypertension  in his brother; Kidney failure in his sister; Stroke in his father.    ROS:  Please see the history of present illness.  Otherwise, review of systems is positive for cough.  All other systems are reviewed and negative.    PHYSICAL EXAM: VS:  BP 130/76   Pulse 60   Ht 6' (1.829 m)   Wt 245 lb 1.9 oz (111.2 kg)   BMI 33.24 kg/m  , BMI Body mass index is 33.24 kg/m. GEN: Well nourished, well developed, in no acute distress  HEENT: normal  Neck: no JVD, no masses. No carotid bruits Cardiac: RRR without murmur or gallop                Respiratory:  clear to auscultation bilaterally, normal work of breathing GI: soft, nontender, nondistended, +  BS MS: no deformity or atrophy  Ext: no pretibial edema, pedal pulses 2+= bilaterally Skin: warm and dry, no rash Neuro:  Strength and sensation are intact Psych: euthymic mood, full affect  EKG:  EKG is ordered today. The ekg ordered today shows Sinus rhythm 59 bpm, first-degree AV block, age indeterminate inferior infarct, left axis deviation. No significant change from previous tracing.  Recent Labs: 09/18/2016: ALT 24; BUN 19; Creatinine, Ser 1.19; Potassium 4.8; Sodium 138   Lipid Panel     Component Value Date/Time   CHOL 111 09/18/2016 0936   TRIG 82.0 09/18/2016 0936   HDL 39.20 09/18/2016 0936   CHOLHDL 3 09/18/2016 0936   VLDL 16.4 09/18/2016 0936   LDLCALC 55 09/18/2016 0936      Wt Readings from Last 3 Encounters:  04/26/17 245 lb 1.9 oz (111.2 kg)  09/18/16 258 lb (117 kg)  03/26/16 261 lb (118.4 kg)     ASSESSMENT AND PLAN: 1.  CAD, native vessel, without angina: The patient had a remote inferior MI and last underwent PCI 17 years ago. He exercises regularly without exertional symptoms. He will continue on his current medical program which includes aspirin, a statin drug, and a beta blocker.  2. Hypertension: Blood pressure is controlled on hydrochlorothiazide, losartan, and metoprolol. We'll check a metabolic panel.  3. Hyperlipidemia: Treated with Crestor 40 mg daily. Lifestyle modification. Last lipids with a cholesterol of 111, LDL 55, HDL 39, triglycerides 82  4. Type 2 diabetes: Last hemoglobin A1c is 4. He is treated with metformin. We'll update labs today.  Current medicines are reviewed with the patient today.  The patient does not have concerns regarding medicines.  Labs/ tests ordered today include:   Orders Placed This Encounter  Procedures  . Comp Met (CMET)  . Lipid panel  . Hemoglobin A1c  . Hemoglobin A1C  . EKG 12-Lead    Disposition:   FU one year  Signed, [name], MD  04/26/2017 10:56 AM    [facility]  [facility] [address], [place], [place]  [address] Phone: [phone]; Fax: [phone]

## 2017-04-27 LAB — HEMOGLOBIN A1C
ESTIMATED AVERAGE GLUCOSE: 283 mg/dL
HEMOGLOBIN A1C: 11.5 % — AB (ref 4.8–5.6)

## 2017-04-29 NOTE — Progress Notes (Signed)
Subjective:    Patient ID: [name], male    DOB: 1941/05/27, 76 y.o.   MRN: [id number]  HPI The patient is here for follow up.  Diabetes: He had a recent a1c done by cardiology and it was 11.5 (04/26/17).  He is taking his medication 70 % of the time - about 5 days out of 7. He is not always compliant with a diabetic diet. He does not limit his carbs.  He does not overdue sweets.  He is exercising regularly - at least 4 times a week.   Hyperlipidemia: He is taking his medication daily. He is compliant with a low fat/cholesterol diet. He is exercising regularly.    CAD, Hypertension: He is taking his medication daily. He is compliant with a low sodium diet.  He denies chest pain, palpitations, edema, shortness of breath and regular headaches. He is exercising regularly.     Medications and allergies reviewed with patient and updated if appropriate.  Patient Active Problem List   Diagnosis Date Noted  . Basal cell carcinoma of face 09/18/2016  . Diabetes ([place]) 02/02/2016  . Oral lichen planus 14/43/1540  . Hx of colonic polyp 09/30/2013  . Essential hypertension 09/13/2010  . LICHEN PLANUS, MOUTH 09/13/2010  . Seasonal allergic rhinitis 02/17/2010  . GILBERT'S SYNDROME 07/25/2009  . CORONARY ATHEROSCLEROSIS NATIVE CORONARY ARTERY 06/06/2009  . HYPERLIPIDEMIA 07/28/2007  . 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 aspirin EC 81 MG tablet Take 81 mg by mouth daily.    Marland Kitchen augmented betamethasone dipropionate (DIPROLENE-AF) 0.05 % cream Apply 1 application topically as directed.     . clobetasol (TEMOVATE) 0.05 % GEL Apply to the inside of mouth daily as need.    Marland Kitchen co-enzyme Q-10 30 MG capsule Take 100 mg by mouth as needed (supplement).     . hydrochlorothiazide (HYDRODIURIL) 25 MG tablet TAKE 1 TABLET BY MOUTH ONCE DAILY 90 tablet 3  . ibuprofen (ADVIL,MOTRIN) 200 MG  tablet Take 200 mg by mouth 2 (two) times daily.     Marland Kitchen losartan (COZAAR) 100 MG tablet TAKE 1 TABLET BY MOUTH DAILY 90 tablet 1  . metFORMIN (GLUCOPHAGE) 500 MG tablet Take 1 tablet by mouth with breakfast and 1 tablet with dinner. --Office visit needed for further refills. 180 tablet 0  . metoprolol tartrate (LOPRESSOR) 25 MG tablet TAKE 1 TABLET BY MOUTH TWICE DAILY 60 tablet 0  . Multiple Vitamin (MULTIVITAMIN) tablet Take 1 tablet by mouth daily.      . rosuvastatin (CRESTOR) 40 MG tablet Take 1 tablet (40 mg total) by mouth daily. 90 tablet 1  . triamcinolone cream (KENALOG) 0.1 % Apply 1 application topically daily as needed (skin). Reported on 03/26/2016      No current facility-administered medications on file prior to visit.     Past Medical History:  Diagnosis Date  . Coronary artery disease   . Hyperlipidemia   . 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 Hypertension   . Lichen planus    oral  . Other abnormal glucose     Past Surgical History:  Procedure Laterality Date  . APPENDECTOMY    . CATARACT EXTRACTION W/ INTRAOCULAR LENS IMPLANT Right 2009   Dr Bing Plume  . CHOLECYSTECTOMY    . CORONARY ANGIOPLASTY WITH STENT PLACEMENT  2000  . [place]  Dr Bing Plume  . TONSILLECTOMY    . VASECTOMY      Social History   Social History  . Marital status: Married    Spouse name: N/A  . Number of children: N/A  . Years of education: N/A   Occupational History  . retired Pharmacist, community Retired   Social History Main Topics  . Smoking status: Former Smoker    Quit date: 10/15/1997  . Smokeless tobacco: Never Used     Comment: smoked 1961-1999, up to 1/2 ppd. Off 3-4 years 1961-1999. Cigar 3/week for 1 year  . Alcohol use 0.0 oz/week     Comment: socially  1-2/ day  . Drug use: No  . Sexual activity: Yes   Other Topics Concern  . None   Social History Narrative  . None    Family History  Problem Relation Age of Onset  .  Coronary artery disease Father        MIx3. Initial MI @ 63  . Diabetes Father   . Stroke Father   . COPD Mother   . Diabetes Mother   . Hypertension Brother        DM- CABG  . Kidney failure Sister     Review of Systems  Constitutional: Negative for chills and fever.  Cardiovascular: Negative for chest pain, palpitations and leg swelling.  Endocrine: Negative for polydipsia and polyuria.  Genitourinary:       Nocturia 2-3 times - no change  Neurological: Negative for light-headedness and headaches.       Objective:   Vitals:   04/30/17 1434  BP: 138/82  Pulse: 84  Resp: 16  Temp: 98.7 F (37.1 C)   Wt Readings from Last 3 Encounters:  04/30/17 246 lb (111.6 kg)  04/26/17 245 lb 1.9 oz (111.2 kg)  09/18/16 258 lb (117 kg)   Body mass index is 33.36 kg/m.   Physical Exam    Constitutional: Appears well-developed and well-nourished. No distress.  HENT:  Head: Normocephalic and atraumatic.  Neck: Neck supple. No tracheal deviation present. No thyromegaly present.  No cervical lymphadenopathy Cardiovascular: Normal rate, regular rhythm and normal heart sounds.   No murmur heard. No carotid bruit .  No edema Pulmonary/Chest: Effort normal and breath sounds normal. No respiratory distress. No has no wheezes. No rales.  Skin: Skin is warm and dry. Not diaphoretic.  Psychiatric: Normal mood and affect. Behavior is normal.      Assessment & Plan:    See Problem List for Assessment and Plan of chronic medical problems.

## 2017-04-29 NOTE — Patient Instructions (Addendum)
Your blood work was reviewed.   Medications reviewed and updated.  Changes include increasing metformin to 1000 mg twice daily and starting jardiance 10 mg daily if covered by insurance.   Your prescription(s) have been submitted to your pharmacy. Please take as directed and contact our office if you believe you are having problem(s) with the medication(s).   Please followup in 3 months    A1C Percentage     Average Blood Sugar (in mg/dL) . 5 percent     97 mg/dL . 6 percent     126 mg/dL . 7 percent     154 mg/dL . 8 percent     183 mg/dL . 9 percent     212 mg/dL . 10 percent  240 mg/dL . 11 percent  269 mg/dL . 12 percent  298 mg/dL . 13 percent  326 mg/dL . 14 percent  355 mg/dL

## 2017-04-29 NOTE — Assessment & Plan Note (Signed)
Lipids well controlled Continue statin at current dose

## 2017-04-30 ENCOUNTER — Other Ambulatory Visit: Payer: Self-pay | Admitting: Internal Medicine

## 2017-04-30 ENCOUNTER — Encounter: Payer: Self-pay | Admitting: Internal Medicine

## 2017-04-30 ENCOUNTER — Ambulatory Visit (INDEPENDENT_AMBULATORY_CARE_PROVIDER_SITE_OTHER): Payer: PPO | Admitting: Internal Medicine

## 2017-04-30 VITALS — BP 138/82 | HR 84 | Temp 98.7°F | Resp 16 | Ht 72.0 in | Wt 246.0 lb

## 2017-04-30 DIAGNOSIS — E782 Mixed hyperlipidemia: Secondary | ICD-10-CM | POA: Diagnosis not present

## 2017-04-30 DIAGNOSIS — E1165 Type 2 diabetes mellitus with hyperglycemia: Secondary | ICD-10-CM

## 2017-04-30 DIAGNOSIS — I10 Essential (primary) hypertension: Secondary | ICD-10-CM

## 2017-04-30 DIAGNOSIS — IMO0001 Reserved for inherently not codable concepts without codable children: Secondary | ICD-10-CM

## 2017-04-30 MED ORDER — EMPAGLIFLOZIN 10 MG PO TABS: 10.0000 mg | ORAL_TABLET | Freq: Every day | ORAL | 5 refills | Status: DC

## 2017-04-30 MED ORDER — BLOOD GLUCOSE MONITOR KIT: PACK | 0 refills | Status: DC

## 2017-04-30 MED ORDER — METFORMIN HCL 1000 MG PO TABS: 1000.0000 mg | ORAL_TABLET | Freq: Two times a day (BID) | ORAL | 3 refills | Status: DC

## 2017-04-30 NOTE — Assessment & Plan Note (Addendum)
Lab Results  Component Value Date   HGBA1C 11.5 (H) 04/26/2017   Not controlled - significant increase in a1c over past 6 months He is exercising Not compliant with a diabetic diet - stressed low sugar/carb diet, deferred nutrition referral at this time Advised weight loss Will increase metformin to 1000 mg BID Start jardiance - it no covered by insurance -- will consider trulicity Glucometer rx given  -- start monitoring sugar at home

## 2017-04-30 NOTE — Assessment & Plan Note (Signed)
BP well controlled Current regimen effective and well tolerated Continue current medications at current doses  

## 2017-05-06 DIAGNOSIS — H2512 Age-related nuclear cataract, left eye: Secondary | ICD-10-CM | POA: Diagnosis not present

## 2017-05-10 DIAGNOSIS — H33312 Horseshoe tear of retina without detachment, left eye: Secondary | ICD-10-CM | POA: Diagnosis not present

## 2017-05-10 DIAGNOSIS — H35372 Puckering of macula, left eye: Secondary | ICD-10-CM | POA: Diagnosis not present

## 2017-05-10 DIAGNOSIS — D3132 Benign neoplasm of left choroid: Secondary | ICD-10-CM | POA: Diagnosis not present

## 2017-05-10 DIAGNOSIS — H43812 Vitreous degeneration, left eye: Secondary | ICD-10-CM | POA: Diagnosis not present

## 2017-05-18 ENCOUNTER — Other Ambulatory Visit: Payer: Self-pay | Admitting: Cardiovascular Disease

## 2017-05-23 DIAGNOSIS — H33312 Horseshoe tear of retina without detachment, left eye: Secondary | ICD-10-CM | POA: Diagnosis not present

## 2017-05-24 ENCOUNTER — Other Ambulatory Visit: Payer: Self-pay | Admitting: Internal Medicine

## 2017-05-30 ENCOUNTER — Other Ambulatory Visit: Payer: Self-pay | Admitting: Cardiovascular Disease

## 2017-06-13 DIAGNOSIS — H2512 Age-related nuclear cataract, left eye: Secondary | ICD-10-CM | POA: Diagnosis not present

## 2017-06-13 DIAGNOSIS — Z7982 Long term (current) use of aspirin: Secondary | ICD-10-CM | POA: Diagnosis not present

## 2017-06-13 DIAGNOSIS — Z87891 Personal history of nicotine dependence: Secondary | ICD-10-CM | POA: Diagnosis not present

## 2017-06-13 DIAGNOSIS — Z79899 Other long term (current) drug therapy: Secondary | ICD-10-CM | POA: Diagnosis not present

## 2017-06-13 DIAGNOSIS — I252 Old myocardial infarction: Secondary | ICD-10-CM | POA: Diagnosis not present

## 2017-06-13 DIAGNOSIS — E785 Hyperlipidemia, unspecified: Secondary | ICD-10-CM | POA: Diagnosis not present

## 2017-06-13 DIAGNOSIS — Z955 Presence of coronary angioplasty implant and graft: Secondary | ICD-10-CM | POA: Diagnosis not present

## 2017-06-13 DIAGNOSIS — E669 Obesity, unspecified: Secondary | ICD-10-CM | POA: Diagnosis not present

## 2017-06-13 DIAGNOSIS — I10 Essential (primary) hypertension: Secondary | ICD-10-CM | POA: Diagnosis not present

## 2017-06-13 DIAGNOSIS — Z961 Presence of intraocular lens: Secondary | ICD-10-CM | POA: Diagnosis not present

## 2017-06-13 DIAGNOSIS — Z888 Allergy status to other drugs, medicaments and biological substances status: Secondary | ICD-10-CM | POA: Diagnosis not present

## 2017-06-13 DIAGNOSIS — E119 Type 2 diabetes mellitus without complications: Secondary | ICD-10-CM | POA: Diagnosis not present

## 2017-06-13 DIAGNOSIS — Z9842 Cataract extraction status, left eye: Secondary | ICD-10-CM | POA: Diagnosis not present

## 2017-07-06 ENCOUNTER — Other Ambulatory Visit: Payer: Self-pay | Admitting: Internal Medicine

## 2017-07-09 ENCOUNTER — Encounter: Payer: Self-pay | Admitting: Internal Medicine

## 2017-07-09 ENCOUNTER — Ambulatory Visit (INDEPENDENT_AMBULATORY_CARE_PROVIDER_SITE_OTHER): Payer: PPO | Admitting: Internal Medicine

## 2017-07-09 VITALS — BP 114/66 | HR 75 | Temp 98.8°F | Resp 16 | Wt 243.0 lb

## 2017-07-09 DIAGNOSIS — I10 Essential (primary) hypertension: Secondary | ICD-10-CM | POA: Diagnosis not present

## 2017-07-09 DIAGNOSIS — J209 Acute bronchitis, unspecified: Secondary | ICD-10-CM | POA: Diagnosis not present

## 2017-07-09 DIAGNOSIS — E782 Mixed hyperlipidemia: Secondary | ICD-10-CM | POA: Diagnosis not present

## 2017-07-09 DIAGNOSIS — E1165 Type 2 diabetes mellitus with hyperglycemia: Secondary | ICD-10-CM

## 2017-07-09 DIAGNOSIS — IMO0001 Reserved for inherently not codable concepts without codable children: Secondary | ICD-10-CM

## 2017-07-09 LAB — POCT GLYCOSYLATED HEMOGLOBIN (HGB A1C): Hemoglobin A1C: 6.3

## 2017-07-09 MED ORDER — HYDROCODONE-HOMATROPINE 5-1.5 MG/5ML PO SYRP: 5.0000 mL | ORAL_SOLUTION | Freq: Three times a day (TID) | ORAL | 0 refills | Status: DC | PRN

## 2017-07-09 MED ORDER — AZITHROMYCIN 250 MG PO TABS: ORAL_TABLET | ORAL | 1 refills | Status: DC

## 2017-07-09 NOTE — Assessment & Plan Note (Signed)
BP well controlled Current regimen effective and well tolerated Continue current medications at current doses  

## 2017-07-09 NOTE — Assessment & Plan Note (Signed)
Zpak Hycodan cough syrup Call if no improvement

## 2017-07-09 NOTE — Progress Notes (Signed)
Subjective:    Patient ID: [name], male    DOB: [birth year], 76 y.o.   MRN: [id number]  HPI He is here for an acute visit for cold symptoms.  His symptoms started one week ago.  He was around his grandkids.   He is experiencing a productive cough of discolored phlegm.  He has PND and some ear pressure and sore throat.   He has tried taking an anti-histamine.  He has had pneumonia in the past.    Diabetes:  His last a1c in July was 11.5.  He was not taking his medication daily, was not compliant with a diabetic diet, but was exercising.  His metformin was increased and I started him on jardiance.  He is taking his medications daily and denies side effects.  He is exercising and eating well.  His sugars at home have been well controlled - 118-145.  Hyperlipidemia: He is taking his medication daily. He is compliant with a low fat/cholesterol diet. He is exercising regularly.     Hypertension: He is taking his medication daily. He is compliant with a low sodium diet.  Marland Kitchen He is exercising regularly.  He does monitor his blood pressure at home - 131/81, 134/81, 122/77, 130/70, 140/84.      Medications and allergies reviewed with patient and updated if appropriate.  Patient Active Problem List   Diagnosis Date Noted  . Basal cell carcinoma of face 09/18/2016  . Uncontrolled diabetes mellitus ([place]) 02/02/2016  . Oral lichen planus 55/37/4827  . Hx of colonic polyp 09/30/2013  . Essential hypertension 09/13/2010  . LICHEN PLANUS, MOUTH 09/13/2010  . Seasonal allergic rhinitis 02/17/2010  . GILBERT'S SYNDROME 07/25/2009  . CORONARY ATHEROSCLEROSIS NATIVE CORONARY ARTERY 06/06/2009  . HYPERLIPIDEMIA 07/28/2007  . 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 aspirin EC 81 MG tablet Take 81 mg by mouth daily.    Marland Kitchen augmented betamethasone dipropionate (DIPROLENE-AF) 0.05 %  cream Apply 1 application topically as directed.     . blood glucose meter kit and supplies KIT Dispense based on patient and insurance preference. Use up to four times daily as directed. (FOR E11.9). 1 each 0  . clobetasol (TEMOVATE) 0.05 % GEL Apply to the inside of mouth daily as need.    Marland Kitchen co-enzyme Q-10 30 MG capsule Take 100 mg by mouth as needed (supplement).     . empagliflozin (JARDIANCE) 10 MG TABS tablet Take 10 mg by mouth daily. 30 tablet 5  . hydrochlorothiazide (HYDRODIURIL) 25 MG tablet Take 1 tablet (25 mg total) by mouth daily. 90 tablet 3  . ibuprofen (ADVIL,MOTRIN) 200 MG tablet Take 200 mg by mouth 2 (two) times daily.     Marland Kitchen losartan (COZAAR) 100 MG tablet TAKE 1 TABLET BY MOUTH DAILY 90 tablet 3  . metFORMIN (GLUCOPHAGE) 1000 MG tablet Take 1 tablet (1,000 mg total) by mouth 2 (two) times daily with a meal. 180 tablet 3  . metoprolol tartrate (LOPRESSOR) 25 MG tablet TAKE 1 TABLET BY MOUTH TWICE DAILY 180 tablet 3  . Multiple Vitamin (MULTIVITAMIN) tablet Take 1 tablet by mouth daily.      . ONE TOUCH ULTRA TEST test strip TEST UP TO FOUR TIMES DAILY AS DIRECTED 300 each 5  . ONETOUCH DELICA LANCETS 07E MISC TEST UP TO FOUR TIMES DAILY AS DIRECTED 100 each 0  . rosuvastatin (CRESTOR) 40 MG  tablet Take 1 tablet (40 mg total) by mouth daily. 90 tablet 1  . triamcinolone cream (KENALOG) 0.1 % Apply 1 application topically daily as needed (skin). Reported on 03/26/2016      No current facility-administered medications on file prior to visit.     Past Medical History:  Diagnosis Date  . Coronary artery disease   . Hyperlipidemia   . 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 Hypertension   . Lichen planus    oral  . Other abnormal glucose     Past Surgical History:  Procedure Laterality Date  . APPENDECTOMY    . CATARACT EXTRACTION W/ INTRAOCULAR LENS IMPLANT Right 2009   Dr Bing Plume  . CHOLECYSTECTOMY    . CORONARY ANGIOPLASTY WITH STENT  PLACEMENT  2000  . REFRACTIVE SURGERY  1999   Dr Bing Plume  . TONSILLECTOMY    . VASECTOMY      Social History   Social History  . Marital status: Married    Spouse name: N/A  . Number of children: N/A  . Years of education: N/A   Occupational History  . retired Pharmacist, community Retired   Social History Main Topics  . Smoking status: Former Smoker    Quit date: 10/15/1997  . Smokeless tobacco: Never Used     Comment: smoked 1961-1999, up to 1/2 ppd. Off 3-4 years 1961-1999. Cigar 3/week for 1 year  . Alcohol use 0.0 oz/week     Comment: socially  1-2/ day  . Drug use: No  . Sexual activity: Yes   Other Topics Concern  . None   Social History Narrative  . None    Family History  Problem Relation Age of Onset  . Coronary artery disease Father        MIx3. Initial MI @ 67  . Diabetes Father   . Stroke Father   . COPD Mother   . Diabetes Mother   . Hypertension Brother        DM- CABG  . Kidney failure Sister     Review of Systems  Constitutional: Negative for chills and fever.  HENT: Positive for postnasal drip and sore throat. Negative for congestion, ear pain (ear pressure) and sinus pain.   Respiratory: Positive for cough (discolored phlegm). Negative for shortness of breath and wheezing.   Neurological: Negative for light-headedness and headaches.       Objective:   Vitals:   07/09/17 1309  BP: 114/66  Pulse: 75  Resp: 16  Temp: 98.8 F (37.1 C)  SpO2: 97%   Filed Weights   07/09/17 1309  Weight: 243 lb (110.2 kg)   Body mass index is 32.96 kg/m.  Wt Readings from Last 3 Encounters:  07/09/17 243 lb (110.2 kg)  04/30/17 246 lb (111.6 kg)  04/26/17 245 lb 1.9 oz (111.2 kg)     Physical Exam GENERAL APPEARANCE: Appears stated age, well appearing, NAD EYES: conjunctiva clear, no icterus HEENT: bilateral tympanic membranes and ear canals normal, oropharynx with mild erythema, no thyromegaly, trachea midline, no cervical or supraclavicular  lymphadenopathy LUNGS: Clear to auscultation without wheeze or crackles, unlabored breathing, good air entry bilaterally HEART: Normal S1,S2 without murmurs EXTREMITIES: Without clubbing, cyanosis, or edema       Assessment & Plan:   See Problem List for Assessment and Plan of chronic medical problems.

## 2017-07-09 NOTE — Assessment & Plan Note (Signed)
Recheck a1c today -sugars well controlled at home Continue current medications

## 2017-07-09 NOTE — Assessment & Plan Note (Signed)
Continue statin. 

## 2017-07-09 NOTE — Patient Instructions (Addendum)
  Your a1c was done today.    Medications reviewed and updated.  Changes include starting a Zpak and cough medication for your cold symptoms.   Your prescription(s) have been submitted to your pharmacy. Please take as directed and contact our office if you believe you are having problem(s) with the medication(s).   Please followup in 6 months

## 2017-07-28 NOTE — Progress Notes (Deleted)
Subjective:    Patient ID: [name], male    DOB: 22-Oct-1940, 76 y.o.   MRN: [id number]  HPI The patient is here for follow up.  Hypertension: He is taking his medication daily. He is compliant with a low sodium diet.  He denies chest pain, palpitations, edema, shortness of breath and regular headaches. He is exercising regularly.  He does not monitor his blood pressure at home.    Hyperlipidemia: He is taking his medication daily. He is compliant with a low fat/cholesterol diet. He is exercising regularly. He denies myalgias.   Diabetes: He is taking his medication daily as prescribed. He is compliant with a diabetic diet. He is exercising regularly. He monitors his sugars and they have been running XXX. He checks his feet daily and denies foot lesions. He is up-to-date with an ophthalmology examination.    Medications and allergies reviewed with patient and updated if appropriate.  Patient Active Problem List   Diagnosis Date Noted  . Acute bronchitis 07/09/2017  . Basal cell carcinoma of face 09/18/2016  . Uncontrolled diabetes mellitus ([place]) 02/02/2016  . Oral lichen planus 42/87/6811  . Hx of colonic polyp 09/30/2013  . Essential hypertension 09/13/2010  . LICHEN PLANUS, MOUTH 09/13/2010  . Seasonal allergic rhinitis 02/17/2010  . GILBERT'S SYNDROME 07/25/2009  . CORONARY ATHEROSCLEROSIS NATIVE CORONARY ARTERY 06/06/2009  . HYPERLIPIDEMIA 07/28/2007  . 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 aspirin EC 81 MG tablet Take 81 mg by mouth daily.    Marland Kitchen augmented betamethasone dipropionate (DIPROLENE-AF) 0.05 % cream Apply 1 application topically as directed.     Marland Kitchen azithromycin (ZITHROMAX) 250 MG tablet Take two tabs the first day and then one tab daily for four days 6 tablet 1  . blood glucose meter kit and supplies KIT Dispense based on patient and insurance  preference. Use up to four times daily as directed. (FOR E11.9). 1 each 0  . clobetasol (TEMOVATE) 0.05 % GEL Apply to the inside of mouth daily as need.    Marland Kitchen co-enzyme Q-10 30 MG capsule Take 100 mg by mouth as needed (supplement).     . empagliflozin (JARDIANCE) 10 MG TABS tablet Take 10 mg by mouth daily. 30 tablet 5  . hydrochlorothiazide (HYDRODIURIL) 25 MG tablet Take 1 tablet (25 mg total) by mouth daily. 90 tablet 3  . HYDROcodone-homatropine (HYCODAN) 5-1.5 MG/5ML syrup Take 5 mLs by mouth every 8 (eight) hours as needed for cough. 120 mL 0  . ibuprofen (ADVIL,MOTRIN) 200 MG tablet Take 200 mg by mouth 2 (two) times daily.     Marland Kitchen losartan (COZAAR) 100 MG tablet TAKE 1 TABLET BY MOUTH DAILY 90 tablet 3  . metFORMIN (GLUCOPHAGE) 1000 MG tablet Take 1 tablet (1,000 mg total) by mouth 2 (two) times daily with a meal. 180 tablet 3  . metoprolol tartrate (LOPRESSOR) 25 MG tablet TAKE 1 TABLET BY MOUTH TWICE DAILY 180 tablet 3  . Multiple Vitamin (MULTIVITAMIN) tablet Take 1 tablet by mouth daily.      . ONE TOUCH ULTRA TEST test strip TEST UP TO FOUR TIMES DAILY AS DIRECTED 300 each 5  . ONETOUCH DELICA LANCETS 57W MISC TEST UP TO FOUR TIMES DAILY AS DIRECTED 100 each 0  . rosuvastatin (CRESTOR) 40 MG tablet Take 1 tablet (40 mg total) by mouth daily. 90 tablet 1  . triamcinolone cream (KENALOG)  0.1 % Apply 1 application topically daily as needed (skin). Reported on 03/26/2016      No current facility-administered medications on file prior to visit.     Past Medical History:  Diagnosis Date  . Coronary artery disease   . Hyperlipidemia   . 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 Hypertension   . Lichen planus    oral  . Other abnormal glucose     Past Surgical History:  Procedure Laterality Date  . APPENDECTOMY    . CATARACT EXTRACTION W/ INTRAOCULAR LENS IMPLANT Right 2009   Dr Bing Plume  . CHOLECYSTECTOMY    . CORONARY ANGIOPLASTY WITH STENT  PLACEMENT  2000  . REFRACTIVE SURGERY  1999   Dr Bing Plume  . TONSILLECTOMY    . VASECTOMY      Social History   Social History  . Marital status: Married    Spouse name: N/A  . Number of children: N/A  . Years of education: N/A   Occupational History  . retired Pharmacist, community Retired   Social History Main Topics  . Smoking status: Former Smoker    Quit date: 10/15/1997  . Smokeless tobacco: Never Used     Comment: smoked 1961-1999, up to 1/2 ppd. Off 3-4 years 1961-1999. Cigar 3/week for 1 year  . Alcohol use 0.0 oz/week     Comment: socially  1-2/ day  . Drug use: No  . Sexual activity: Yes   Other Topics Concern  . Not on file   Social History Narrative  . No narrative on file    Family History  Problem Relation Age of Onset  . Coronary artery disease Father        MIx3. Initial MI @ 49  . Diabetes Father   . Stroke Father   . COPD Mother   . Diabetes Mother   . Hypertension Brother        DM- CABG  . Kidney failure Sister     Review of Systems     Objective:  There were no vitals filed for this visit. Wt Readings from Last 3 Encounters:  07/09/17 243 lb (110.2 kg)  04/30/17 246 lb (111.6 kg)  04/26/17 245 lb 1.9 oz (111.2 kg)   There is no height or weight on file to calculate BMI.   Physical Exam    Constitutional: Appears well-developed and well-nourished. No distress.  HENT:  Head: Normocephalic and atraumatic.  Neck: Neck supple. No tracheal deviation present. No thyromegaly present.  No cervical lymphadenopathy Cardiovascular: Normal rate, regular rhythm and normal heart sounds.   No murmur heard. No carotid bruit .  No edema Pulmonary/Chest: Effort normal and breath sounds normal. No respiratory distress. No has no wheezes. No rales.  Skin: Skin is warm and dry. Not diaphoretic.  Psychiatric: Normal mood and affect. Behavior is normal.      Assessment & Plan:    See Problem List for Assessment and Plan of chronic medical problems.

## 2017-07-31 ENCOUNTER — Ambulatory Visit: Payer: PPO | Admitting: Internal Medicine

## 2017-08-20 DIAGNOSIS — L918 Other hypertrophic disorders of the skin: Secondary | ICD-10-CM | POA: Diagnosis not present

## 2017-08-20 DIAGNOSIS — L570 Actinic keratosis: Secondary | ICD-10-CM | POA: Diagnosis not present

## 2017-08-20 DIAGNOSIS — D1801 Hemangioma of skin and subcutaneous tissue: Secondary | ICD-10-CM | POA: Diagnosis not present

## 2017-08-20 DIAGNOSIS — L218 Other seborrheic dermatitis: Secondary | ICD-10-CM | POA: Diagnosis not present

## 2017-08-20 DIAGNOSIS — Z85828 Personal history of other malignant neoplasm of skin: Secondary | ICD-10-CM | POA: Diagnosis not present

## 2017-08-20 DIAGNOSIS — D2261 Melanocytic nevi of right upper limb, including shoulder: Secondary | ICD-10-CM | POA: Diagnosis not present

## 2017-08-20 DIAGNOSIS — L821 Other seborrheic keratosis: Secondary | ICD-10-CM | POA: Diagnosis not present

## 2017-08-20 DIAGNOSIS — L280 Lichen simplex chronicus: Secondary | ICD-10-CM | POA: Diagnosis not present

## 2017-10-01 ENCOUNTER — Other Ambulatory Visit: Payer: Self-pay | Admitting: Internal Medicine

## 2017-11-11 DIAGNOSIS — Z86010 Personal history of colonic polyps: Secondary | ICD-10-CM | POA: Diagnosis not present

## 2017-11-11 DIAGNOSIS — Z1211 Encounter for screening for malignant neoplasm of colon: Secondary | ICD-10-CM | POA: Diagnosis not present

## 2017-11-11 LAB — HM COLONOSCOPY

## 2017-11-14 ENCOUNTER — Encounter: Payer: Self-pay | Admitting: Internal Medicine

## 2017-11-14 DIAGNOSIS — I219 Acute myocardial infarction, unspecified: Secondary | ICD-10-CM

## 2017-11-14 DIAGNOSIS — R2232 Localized swelling, mass and lump, left upper limb: Secondary | ICD-10-CM | POA: Diagnosis not present

## 2017-11-14 DIAGNOSIS — M79642 Pain in left hand: Secondary | ICD-10-CM | POA: Diagnosis not present

## 2017-11-14 DIAGNOSIS — M19039 Primary osteoarthritis, unspecified wrist: Secondary | ICD-10-CM | POA: Diagnosis not present

## 2017-11-14 DIAGNOSIS — M19031 Primary osteoarthritis, right wrist: Secondary | ICD-10-CM | POA: Diagnosis not present

## 2017-11-14 HISTORY — DX: Acute myocardial infarction, unspecified: I21.9

## 2017-11-21 DIAGNOSIS — M79642 Pain in left hand: Secondary | ICD-10-CM | POA: Diagnosis not present

## 2017-12-04 ENCOUNTER — Other Ambulatory Visit: Payer: Self-pay | Admitting: Cardiovascular Disease

## 2017-12-16 ENCOUNTER — Telehealth: Payer: Self-pay | Admitting: *Deleted

## 2017-12-16 NOTE — Telephone Encounter (Signed)
   [facility] Pre-operative Risk Assessment    Request for surgical clearance:  1. What type of surgery is being performed? 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 When is this surgery scheduled? 01/14/18  3. What type of clearance is required (medical clearance vs. Pharmacy clearance to hold med vs. Both)? Medical and Pharmacy   4. Are there any medications that need to be held prior to surgery and how long? Aspirin  5. Practice name and name of physician performing surgery? [facility], Dr. Amedeo Plenty   6. What is your office phone and fax number?   P: [phone] F: [phone]  7. Anesthesia type (None, local, MAC, general)? General anesthesia.   [name] D 12/16/2017, 5:24 PM  _________________________________________________________________   (provider comments below)

## 2017-12-17 NOTE — Telephone Encounter (Signed)
Ok to hold aspirin 7 days if needed. thanks

## 2017-12-17 NOTE — Telephone Encounter (Signed)
   Primary Cardiologist: [name], MD  Chart reviewed as part of pre-operative protocol coverage. Patient was contacted 12/17/2017 in reference to pre-operative risk assessment for pending surgery as outlined below.  [name] was last seen on 04/26/17 by Dr. Burt Knack.  Since that day, [name] has done well. He continues to exercise 5 days per week (30 min cardio and resistance training), well over 4 METS with no exertional symptoms.  Per Dr. Burt Knack he can hold the aspirin for 7 days if needed.   Therefore, based on ACC/AHA guidelines, the patient would be at acceptable risk for the planned procedure without further cardiovascular testing.   I will route this recommendation to the requesting party via Epic fax function and remove from pre-op pool.  Please call with questions.  Daune Perch, NP 12/17/2017, 5:27 PM

## 2017-12-17 NOTE — Telephone Encounter (Signed)
Dr. 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  Please send response to p cv div preop pool

## 2017-12-23 ENCOUNTER — Telehealth: Payer: Self-pay | Admitting: Internal Medicine

## 2017-12-23 DIAGNOSIS — E785 Hyperlipidemia, unspecified: Secondary | ICD-10-CM

## 2017-12-23 DIAGNOSIS — E1365 Other specified diabetes mellitus with hyperglycemia: Secondary | ICD-10-CM

## 2017-12-23 DIAGNOSIS — I10 Essential (primary) hypertension: Secondary | ICD-10-CM

## 2017-12-23 NOTE — Telephone Encounter (Signed)
Patient would like to have labs put in for him to have done prior to his visit on 01/08/2018. He is wanting to be able to come in fasting to have them done and so that he can review these with Dr Quay Burow while he is here on the 27th. Can these be entered for him? I can call the patient to let him know.

## 2017-12-23 NOTE — Telephone Encounter (Signed)
Ordered signed.

## 2017-12-24 NOTE — Telephone Encounter (Signed)
LM informing patient.

## 2018-01-06 ENCOUNTER — Other Ambulatory Visit (INDEPENDENT_AMBULATORY_CARE_PROVIDER_SITE_OTHER): Payer: PPO

## 2018-01-06 DIAGNOSIS — IMO0001 Reserved for inherently not codable concepts without codable children: Secondary | ICD-10-CM

## 2018-01-06 DIAGNOSIS — E1365 Other specified diabetes mellitus with hyperglycemia: Secondary | ICD-10-CM | POA: Diagnosis not present

## 2018-01-06 DIAGNOSIS — I10 Essential (primary) hypertension: Secondary | ICD-10-CM

## 2018-01-06 DIAGNOSIS — E785 Hyperlipidemia, unspecified: Secondary | ICD-10-CM

## 2018-01-06 LAB — CBC WITH DIFFERENTIAL/PLATELET
BASOS PCT: 0.6 % (ref 0.0–3.0)
Basophils Absolute: 0.1 10*3/uL (ref 0.0–0.1)
EOS ABS: 0.2 10*3/uL (ref 0.0–0.7)
Eosinophils Relative: 2.4 % (ref 0.0–5.0)
HCT: 50.2 % (ref 39.0–52.0)
Hemoglobin: 17.3 g/dL — ABNORMAL HIGH (ref 13.0–17.0)
Lymphocytes Relative: 16.3 % (ref 12.0–46.0)
Lymphs Abs: 1.4 10*3/uL (ref 0.7–4.0)
MCHC: 34.5 g/dL (ref 30.0–36.0)
MCV: 93.6 fl (ref 78.0–100.0)
MONO ABS: 0.8 10*3/uL (ref 0.1–1.0)
Monocytes Relative: 9.8 % (ref 3.0–12.0)
NEUTROS ABS: 6.1 10*3/uL (ref 1.4–7.7)
NEUTROS PCT: 70.9 % (ref 43.0–77.0)
PLATELETS: 208 10*3/uL (ref 150.0–400.0)
RBC: 5.36 Mil/uL (ref 4.22–5.81)
RDW: 12.7 % (ref 11.5–15.5)
WBC: 8.6 10*3/uL (ref 4.0–10.5)

## 2018-01-06 LAB — COMPREHENSIVE METABOLIC PANEL
ALBUMIN: 4.1 g/dL (ref 3.5–5.2)
ALT: 23 U/L (ref 0–53)
AST: 18 U/L (ref 0–37)
Alkaline Phosphatase: 49 U/L (ref 39–117)
BILIRUBIN TOTAL: 1 mg/dL (ref 0.2–1.2)
BUN: 19 mg/dL (ref 6–23)
CALCIUM: 9.5 mg/dL (ref 8.4–10.5)
CO2: 29 mEq/L (ref 19–32)
CREATININE: 1.15 mg/dL (ref 0.40–1.50)
Chloride: 100 mEq/L (ref 96–112)
GFR: 66.01 mL/min (ref >60.00)
Glucose, Bld: 173 mg/dL — ABNORMAL HIGH (ref 70–99)
Potassium: 4.3 mEq/L (ref 3.5–5.1)
Sodium: 138 mEq/L (ref 135–145)
Total Protein: 6.9 g/dL (ref 6.0–8.3)

## 2018-01-06 LAB — LIPID PANEL
CHOLESTEROL: 93 mg/dL (ref 0–200)
HDL: 30.5 mg/dL — ABNORMAL LOW (ref >39.00)
LDL Cholesterol: 37 mg/dL (ref 0–99)
NonHDL: 62.35
TRIGLYCERIDES: 125 mg/dL (ref 0.0–149.0)
Total CHOL/HDL Ratio: 3
VLDL: 25 mg/dL (ref 0.0–40.0)

## 2018-01-06 LAB — HEMOGLOBIN A1C: Hgb A1c MFr Bld: 7 % — ABNORMAL HIGH (ref 4.6–6.5)

## 2018-01-07 NOTE — Assessment & Plan Note (Addendum)
Lab Results  Component Value Date   HGBA1C 7.0 (H) 01/06/2018   Continue current medications - take jardiance daily Continue regular exercise, healthy diet

## 2018-01-07 NOTE — Progress Notes (Signed)
Subjective:    Patient ID: [name], male    DOB: October 02, 1941, 77 y.o.   MRN: [id number]  HPI The patient is here for follow up.  Hypertension: He is taking his medication daily. He is compliant with a low sodium diet.  He denies chest pain, palpitations, edema, shortness of breath and regular headaches. He is exercising regularly.  He does not monitor his blood pressure at home.    Diabetes: He is taking his medication daily as prescribed. He is compliant with a diabetic diet. He is exercising regularly. He monitors his sugars and they have been well controlled. He checks his feet daily and denies foot lesions. He is up-to-date with an ophthalmology examination.   Hyperlipidemia: He is taking his medication daily. He is compliant with a low fat/cholesterol diet. He is exercising regularly. He denies myalgias.    Medications and allergies reviewed with patient and updated if appropriate.  Patient Active Problem List   Diagnosis Date Noted  . Basal cell carcinoma of face 09/18/2016  . Uncontrolled diabetes mellitus ([place]) 02/02/2016  . Oral lichen planus 76/72/0947  . Hx of colonic polyp 09/30/2013  . Essential hypertension 09/13/2010  . LICHEN PLANUS, MOUTH 09/13/2010  . Seasonal allergic rhinitis 02/17/2010  . GILBERT'S SYNDROME 07/25/2009  . CORONARY ATHEROSCLEROSIS NATIVE CORONARY ARTERY 06/06/2009  . HYPERLIPIDEMIA 07/28/2007  . 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 aspirin EC 81 MG tablet Take 81 mg by mouth daily.    Marland Kitchen augmented betamethasone dipropionate (DIPROLENE-AF) 0.05 % cream Apply 1 application topically as directed.     . blood glucose meter kit and supplies KIT Dispense based on patient and insurance preference. Use up to four times daily as directed. (FOR E11.9). 1 each 0  . clobetasol (TEMOVATE) 0.05 % GEL Apply to the inside of mouth daily as need.     Marland Kitchen co-enzyme Q-10 30 MG capsule Take 100 mg by mouth as needed (supplement).     . empagliflozin (JARDIANCE) 10 MG TABS tablet Take 10 mg by mouth daily. 30 tablet 5  . hydrochlorothiazide (HYDRODIURIL) 25 MG tablet Take 1 tablet (25 mg total) by mouth daily. 90 tablet 3  . ibuprofen (ADVIL,MOTRIN) 200 MG tablet Take 200 mg by mouth 2 (two) times daily.     Marland Kitchen losartan (COZAAR) 100 MG tablet TAKE 1 TABLET BY MOUTH DAILY 90 tablet 3  . metFORMIN (GLUCOPHAGE) 1000 MG tablet Take 1 tablet (1,000 mg total) by mouth 2 (two) times daily with a meal. 180 tablet 3  . metoprolol tartrate (LOPRESSOR) 25 MG tablet TAKE 1 TABLET BY MOUTH TWICE DAILY 180 tablet 3  . Multiple Vitamin (MULTIVITAMIN) tablet Take 1 tablet by mouth daily.      . ONE TOUCH ULTRA TEST test strip TEST UP TO FOUR TIMES DAILY AS DIRECTED 300 each 5  . ONETOUCH DELICA LANCETS 09G MISC TEST UP TO FOUR TIMES DAILY AS DIRECTED 100 each 0  . rosuvastatin (CRESTOR) 40 MG tablet TAKE 1 TABLET(40 MG) BY MOUTH DAILY 90 tablet 0  . triamcinolone cream (KENALOG) 0.1 % Apply 1 application topically daily as needed (skin). Reported on 03/26/2016      No current facility-administered medications on file prior to visit.     Past Medical History:  Diagnosis Date  . Coronary artery disease   . Hyperlipidemia   . Hyperplasia of prostate without lower  urinary tract symptoms (LUTS)   . Hypertension   . Lichen planus    oral  . Other abnormal glucose     Past Surgical History:  Procedure Laterality Date  . APPENDECTOMY    . CATARACT EXTRACTION W/ INTRAOCULAR LENS IMPLANT Right 2009   Dr Bing Plume  . CHOLECYSTECTOMY    . CORONARY ANGIOPLASTY WITH STENT PLACEMENT  2000  . REFRACTIVE SURGERY  1999   Dr Bing Plume  . TONSILLECTOMY    . VASECTOMY      Social History   Socioeconomic History  . Marital status: Married    Spouse name: Not on file  . Number of children: Not on file  . Years of education: Not on file  . Highest education level:  Not on file  Occupational History  . Occupation: retired Engineer, site: RETIRED  Social Needs  . Financial resource strain: Not on file  . Food insecurity:    Worry: Not on file    Inability: Not on file  . Transportation needs:    Medical: Not on file    Non-medical: Not on file  Tobacco Use  . Smoking status: Former Smoker    Last attempt to quit: 10/15/1997    Years since quitting: 20.2  . Smokeless tobacco: Never Used  . Tobacco comment: smoked 1961-1999, up to 1/2 ppd. Off 3-4 years 1961-1999. Cigar 3/week for 1 year  Substance and Sexual Activity  . Alcohol use: Yes    Alcohol/week: 0.0 oz    Comment: socially  1-2/ day  . Drug use: No  . Sexual activity: Yes  Lifestyle  . Physical activity:    Days per week: Not on file    Minutes per session: Not on file  . Stress: Not on file  Relationships  . Social connections:    Talks on phone: Not on file    Gets together: Not on file    Attends religious service: Not on file    Active member of club or organization: Not on file    Attends meetings of clubs or organizations: Not on file    Relationship status: Not on file  Other Topics Concern  . Not on file  Social History Narrative  . Not on file    Family History  Problem Relation Age of Onset  . Coronary artery disease Father        MIx3. Initial MI @ 59  . Diabetes Father   . Stroke Father   . COPD Mother   . Diabetes Mother   . Hypertension Brother        DM- CABG  . Kidney failure Sister     Review of Systems  Constitutional: Negative for chills and fever.  Respiratory: Negative for cough, shortness of breath and wheezing.   Cardiovascular: Negative for chest pain, palpitations and leg swelling.  Neurological: Negative for light-headedness and headaches.       Objective:   Vitals:   01/08/18 0949  BP: 134/76  Pulse: 78  Resp: 16  Temp: 97.9 F (36.6 C)  SpO2: 96%   BP Readings from Last 3 Encounters:  01/08/18 134/76  07/09/17  114/66  04/30/17 138/82   Wt Readings from Last 3 Encounters:  01/08/18 253 lb (114.8 kg)  07/09/17 243 lb (110.2 kg)  04/30/17 246 lb (111.6 kg)   Body mass index is 34.31 kg/m.   Physical Exam    Constitutional: Appears well-developed and well-nourished. No distress.  HENT:  Head: Normocephalic  and atraumatic.  Neck: Neck supple. No tracheal deviation present. No thyromegaly present.  No cervical lymphadenopathy Cardiovascular: Normal rate, regular rhythm and normal heart sounds.   No murmur heard. No carotid bruit .  No edema Pulmonary/Chest: Effort normal and breath sounds normal. No respiratory distress. No has no wheezes. No rales.  Skin: Skin is warm and dry. Not diaphoretic.  Psychiatric: Normal mood and affect. Behavior is normal.      Assessment & Plan:    See Problem List for Assessment and Plan of chronic medical problems.

## 2018-01-07 NOTE — Patient Instructions (Addendum)
  Your blood work was reviewed   Medications reviewed and updated.   No changes recommended at this time.    Please followup in 6 months

## 2018-01-08 ENCOUNTER — Encounter: Payer: Self-pay | Admitting: Internal Medicine

## 2018-01-08 ENCOUNTER — Ambulatory Visit (INDEPENDENT_AMBULATORY_CARE_PROVIDER_SITE_OTHER): Payer: PPO | Admitting: Internal Medicine

## 2018-01-08 VITALS — BP 134/76 | HR 78 | Temp 97.9°F | Resp 16 | Wt 253.0 lb

## 2018-01-08 DIAGNOSIS — E1165 Type 2 diabetes mellitus with hyperglycemia: Secondary | ICD-10-CM

## 2018-01-08 DIAGNOSIS — E782 Mixed hyperlipidemia: Secondary | ICD-10-CM | POA: Diagnosis not present

## 2018-01-08 DIAGNOSIS — I10 Essential (primary) hypertension: Secondary | ICD-10-CM

## 2018-01-08 DIAGNOSIS — Z125 Encounter for screening for malignant neoplasm of prostate: Secondary | ICD-10-CM | POA: Diagnosis not present

## 2018-01-08 NOTE — Assessment & Plan Note (Signed)
BP well controlled Current regimen effective and well tolerated Continue current medications at current doses cmp done

## 2018-01-08 NOTE — Assessment & Plan Note (Signed)
Lipids well controlled continue statin

## 2018-01-14 ENCOUNTER — Other Ambulatory Visit: Payer: Self-pay | Admitting: Orthopedic Surgery

## 2018-01-14 DIAGNOSIS — R2232 Localized swelling, mass and lump, left upper limb: Secondary | ICD-10-CM | POA: Diagnosis not present

## 2018-01-14 DIAGNOSIS — G5622 Lesion of ulnar nerve, left upper limb: Secondary | ICD-10-CM | POA: Diagnosis not present

## 2018-01-14 DIAGNOSIS — D1722 Benign lipomatous neoplasm of skin and subcutaneous tissue of left arm: Secondary | ICD-10-CM | POA: Diagnosis not present

## 2018-01-14 DIAGNOSIS — D481 Neoplasm of uncertain behavior of connective and other soft tissue: Secondary | ICD-10-CM | POA: Diagnosis not present

## 2018-01-27 DIAGNOSIS — M25632 Stiffness of left wrist, not elsewhere classified: Secondary | ICD-10-CM | POA: Diagnosis not present

## 2018-02-06 ENCOUNTER — Telehealth: Payer: Self-pay | Admitting: Emergency Medicine

## 2018-02-06 NOTE — Telephone Encounter (Signed)
Called patient to schedule AWV. Patient declined at this time. 

## 2018-03-19 ENCOUNTER — Other Ambulatory Visit: Payer: Self-pay | Admitting: Internal Medicine

## 2018-03-26 ENCOUNTER — Other Ambulatory Visit: Payer: Self-pay | Admitting: Cardiovascular Disease

## 2018-04-02 DIAGNOSIS — M545 Low back pain: Secondary | ICD-10-CM | POA: Diagnosis not present

## 2018-05-27 ENCOUNTER — Other Ambulatory Visit: Payer: Self-pay | Admitting: Internal Medicine

## 2018-06-02 ENCOUNTER — Other Ambulatory Visit: Payer: Self-pay | Admitting: Cardiovascular Disease

## 2018-06-09 ENCOUNTER — Telehealth: Payer: Self-pay

## 2018-06-09 DIAGNOSIS — I10 Essential (primary) hypertension: Secondary | ICD-10-CM

## 2018-06-09 DIAGNOSIS — I2510 Atherosclerotic heart disease of native coronary artery without angina pectoris: Secondary | ICD-10-CM

## 2018-06-09 DIAGNOSIS — E782 Mixed hyperlipidemia: Secondary | ICD-10-CM

## 2018-06-09 DIAGNOSIS — E1165 Type 2 diabetes mellitus with hyperglycemia: Secondary | ICD-10-CM

## 2018-06-09 NOTE — Telephone Encounter (Signed)
Copied from [place] [id number]. Topic: General - Other >> Jun 09, 2018  2:34 PM Burchel, Abbi R wrote: Reason for CRM:  Pt would like to know if he needs to come in for labs before his 07/15/18 appt.  Please advise.

## 2018-06-09 NOTE — Telephone Encounter (Signed)
Yes, ordered.

## 2018-06-10 NOTE — Telephone Encounter (Signed)
Error

## 2018-06-10 NOTE — Telephone Encounter (Signed)
LVM letting pt know.  

## 2018-06-11 ENCOUNTER — Other Ambulatory Visit: Payer: PPO

## 2018-06-11 ENCOUNTER — Other Ambulatory Visit (INDEPENDENT_AMBULATORY_CARE_PROVIDER_SITE_OTHER): Payer: PPO

## 2018-06-11 DIAGNOSIS — E782 Mixed hyperlipidemia: Secondary | ICD-10-CM | POA: Diagnosis not present

## 2018-06-11 DIAGNOSIS — I10 Essential (primary) hypertension: Secondary | ICD-10-CM | POA: Diagnosis not present

## 2018-06-11 DIAGNOSIS — E1165 Type 2 diabetes mellitus with hyperglycemia: Secondary | ICD-10-CM | POA: Diagnosis not present

## 2018-06-11 LAB — HEMOGLOBIN A1C: Hgb A1c MFr Bld: 7.7 % — ABNORMAL HIGH (ref 4.6–6.5)

## 2018-06-11 LAB — COMPREHENSIVE METABOLIC PANEL
ALBUMIN: 4.6 g/dL (ref 3.5–5.2)
ALK PHOS: 47 U/L (ref 39–117)
ALT: 27 U/L (ref 0–53)
AST: 19 U/L (ref 0–37)
BILIRUBIN TOTAL: 1.5 mg/dL — AB (ref 0.2–1.2)
BUN: 22 mg/dL (ref 6–23)
CALCIUM: 9.9 mg/dL (ref 8.4–10.5)
CO2: 29 mEq/L (ref 19–32)
CREATININE: 1.2 mg/dL (ref 0.40–1.50)
Chloride: 99 mEq/L (ref 96–112)
GFR: 62.77 mL/min (ref >60.00)
Glucose, Bld: 165 mg/dL — ABNORMAL HIGH (ref 70–99)
Potassium: 4.6 mEq/L (ref 3.5–5.1)
Sodium: 139 mEq/L (ref 135–145)
TOTAL PROTEIN: 6.6 g/dL (ref 6.0–8.3)

## 2018-06-11 LAB — LIPID PANEL
Cholesterol: 121 mg/dL (ref 0–200)
HDL: 34.2 mg/dL — ABNORMAL LOW (ref >39.00)
LDL Cholesterol: 59 mg/dL (ref 0–99)
NonHDL: 86.65
TRIGLYCERIDES: 139 mg/dL (ref 0.0–149.0)
Total CHOL/HDL Ratio: 4
VLDL: 27.8 mg/dL (ref 0.0–40.0)

## 2018-06-17 ENCOUNTER — Other Ambulatory Visit: Payer: Self-pay | Admitting: Cardiovascular Disease

## 2018-06-17 ENCOUNTER — Telehealth: Payer: Self-pay | Admitting: Cardiovascular Disease

## 2018-06-17 NOTE — Telephone Encounter (Signed)
New Message:   Patient went to Dr. Celso Amy office and had some labs drawn and would like for Dr. Burt Knack nurse to get in touch with Dr. Quay Burow office to get lab results. His appt is tomorrow 9:20 am, Patient would like for them to be here for his appt so he would not have to get blood drawn tomorrow.

## 2018-06-17 NOTE — Telephone Encounter (Signed)
Informed patient labs are in Epic and available for Dr. Antionette Char viewing at [facility] tomorrow. He was grateful for call and agrees with treatment plan.

## 2018-06-18 ENCOUNTER — Encounter: Payer: Self-pay | Admitting: Cardiovascular Disease

## 2018-06-18 ENCOUNTER — Ambulatory Visit: Payer: PPO | Admitting: Cardiovascular Disease

## 2018-06-18 VITALS — BP 118/72 | HR 67 | Ht 72.0 in | Wt 256.0 lb

## 2018-06-18 DIAGNOSIS — E782 Mixed hyperlipidemia: Secondary | ICD-10-CM | POA: Diagnosis not present

## 2018-06-18 DIAGNOSIS — I2510 Atherosclerotic heart disease of native coronary artery without angina pectoris: Secondary | ICD-10-CM | POA: Diagnosis not present

## 2018-06-18 DIAGNOSIS — I10 Essential (primary) hypertension: Secondary | ICD-10-CM | POA: Diagnosis not present

## 2018-06-18 NOTE — Progress Notes (Signed)
Cardiology Office Note Date:  06/18/2018   ID:  [name], [name], DOB [birth year], MRN [id number]  PCP:  [name], MD  Cardiologist:  [name], MD    Chief Complaint  Patient presents with  . 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  The patient has coronary artery disease and initially presented in 2000 with an acute inferior wall MI.  He underwent angioplasty of in-stent restenosis the following year and was noted to have moderate LAD stenosis at that time with recommendation for medical therapy.  He has done well since then with no recurrent ischemic events.  Other comorbid conditions include hypertension, hyperlipidemia, and type 2 diabetes.  The patient is here alone today.  He has been doing more part-time work and has had less time for exercise.  Notes that his weight is up a little bit and his hemoglobin A1c has increased.  States that his daily blood sugars have also increased.  Recent hemoglobin A1c is 7.7 mg/dL.  He continues to exercise on the treadmill and denies any symptoms with exertion.  He specifically denies chest pain, shortness of breath, leg swelling, exercise intolerance, lightheadedness, or heart palpitations.  He is compliant with his medications.   Past Medical History:  Diagnosis Date  . Coronary artery disease   . Hyperlipidemia   . 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 Hypertension   . Lichen planus    oral  . Other abnormal glucose     Past Surgical History:  Procedure Laterality Date  . APPENDECTOMY    . CATARACT EXTRACTION W/ INTRAOCULAR LENS IMPLANT Right 2009   Dr Bing Plume  . CHOLECYSTECTOMY    . CORONARY ANGIOPLASTY WITH STENT PLACEMENT  2000  . REFRACTIVE SURGERY  1999   Dr Bing Plume  . TONSILLECTOMY    . VASECTOMY      Current Outpatient Medications  Medication Sig Dispense Refill  . aspirin EC 81 MG tablet  Take 81 mg by mouth daily.    Marland Kitchen augmented betamethasone dipropionate (DIPROLENE-AF) 0.05 % cream Apply 1 application topically as directed.     . blood glucose meter kit and supplies KIT Dispense based on patient and insurance preference. Use up to four times daily as directed. (FOR E11.9). 1 each 0  . clobetasol (TEMOVATE) 0.05 % GEL Apply to the inside of mouth daily as need.    Marland Kitchen co-enzyme Q-10 30 MG capsule Take 100 mg by mouth as needed (supplement).     . hydrochlorothiazide (HYDRODIURIL) 25 MG tablet Take 1 tablet (25 mg total) by mouth daily. 90 tablet 3  . ibuprofen (ADVIL,MOTRIN) 200 MG tablet Take 200 mg by mouth 2 (two) times daily.     Marland Kitchen JARDIANCE 10 MG TABS tablet TAKE 1 TABLET BY MOUTH DAILY 30 tablet 4  . losartan (COZAAR) 100 MG tablet TAKE 1 TABLET BY MOUTH DAILY 90 tablet 0  . metFORMIN (GLUCOPHAGE) 1000 MG tablet Take 1 tablet (1,000 mg total) by mouth 2 (two) times daily with a meal. 180 tablet 3  . metoprolol tartrate (LOPRESSOR) 25 MG tablet Take 1 tablet (25 mg total) by mouth 2 (two) times daily. Please keep upcoming appt in September for future refills. Thank you 180 tablet 0  . Multiple Vitamin (MULTIVITAMIN) tablet Take 1 tablet by mouth daily.      . ONE TOUCH ULTRA TEST  test strip TEST UP TO FOUR TIMES DAILY AS DIRECTED 300 each 5  . ONETOUCH DELICA LANCETS 40N MISC TEST UP TO FOUR TIMES DAILY AS DIRECTED 100 each 0  . rosuvastatin (CRESTOR) 40 MG tablet Take 1 tablet (40 mg total) by mouth daily. Please keep upcoming appt in September. Thank you 90 tablet 0  . triamcinolone cream (KENALOG) 0.1 % Apply 1 application topically daily as needed (skin). Reported on 03/26/2016      No current facility-administered medications for this visit.     Allergies:   Ramipril   Social History:  The patient  reports that he quit smoking about 20 years ago. He has never used smokeless tobacco. He reports that he drinks alcohol. He reports that he does not use drugs.   Family  History:  The patient's family history includes COPD in his mother; Coronary artery disease in his father; Diabetes in his father and mother; Hypertension in his brother; Kidney failure in his sister; Stroke in his father.    ROS:  Please see the history of present illness.  Otherwise, review of systems is positive for back pain.  All other systems are reviewed and negative.    PHYSICAL EXAM: VS:  BP 118/72   Pulse 67   Ht 6' (1.829 m)   Wt 256 lb (116.1 kg)   BMI 34.72 kg/m  , BMI Body mass index is 34.72 kg/m. GEN: Well nourished, well developed, in no acute distress  HEENT: normal  Neck: no JVD, no masses. No carotid bruits Cardiac: RRR without murmur or gallop                Respiratory:  clear to auscultation bilaterally, normal work of breathing GI: soft, nontender, nondistended, + BS MS: no deformity or atrophy  Ext: no pretibial edema, pedal pulses 2+= bilaterally Skin: warm and dry, no rash Neuro:  Strength and sensation are intact Psych: euthymic mood, full affect  EKG:  EKG is ordered today. The ekg ordered today shows normal sinus rhythm with first-degree AV block, heart rate 67 bpm, incomplete right bundle branch block, age-indeterminate inferior infarct.  Unchanged from previous tracing 1 year ago.  Recent Labs: 01/06/2018: Hemoglobin 17.3; Platelets 208.0 06/11/2018: ALT 27; BUN 22; Creatinine, Ser 1.20; Potassium 4.6; Sodium 139   Lipid Panel     Component Value Date/Time   CHOL 121 06/11/2018 0736   CHOL 94 (L) 04/26/2017 1008   TRIG 139.0 06/11/2018 0736   HDL 34.20 (L) 06/11/2018 0736   HDL 32 (L) 04/26/2017 1008   CHOLHDL 4 06/11/2018 0736   VLDL 27.8 06/11/2018 0736   LDLCALC 59 06/11/2018 0736   LDLCALC 40 04/26/2017 1008      Wt Readings from Last 3 Encounters:  06/18/18 256 lb (116.1 kg)  01/08/18 253 lb (114.8 kg)  07/09/17 243 lb (110.2 kg)     ASSESSMENT AND PLAN: 1.  Coronary artery disease, native vessel, without angina: The patient  will continue on his current medical program.  He is on aspirin for antiplatelet therapy, a beta-blocker, and an ARB in the context of type 2 diabetes.  2.  Hypertension: Blood pressure is well controlled on his current medical program.  No changes are made today.  3.  Hyperlipidemia: Lipids reviewed with an LDL cholesterol 59 mg/dL.  The patient is on a high intensity statin drug with Crestor 40 mg daily.  4.  Type 2 diabetes: Most recent hemoglobin A1c is 7.7 mg/dL.  We discussed lifestyle modification  today, including measures to achieve weight loss and more regular exercise.  Current medicines are reviewed with the patient today.  The patient does not have concerns regarding medicines.  Labs/ tests ordered today include:  No orders of the defined types were placed in this encounter.   Disposition:   FU one year  Signed, [name], MD  06/18/2018 8:38 AM    [address], [place], [place]  [address] Phone: [phone]; Fax: [phone]

## 2018-06-18 NOTE — Patient Instructions (Signed)

## 2018-07-13 NOTE — Progress Notes (Signed)
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   MRN: [id number]  HPI The patient is here for follow up.  Diabetes: He is taking his medication daily as prescribed. He is compliant with a diabetic diet. He is exercising regularly, but it varies in amount of exercise depending on how much he is working.Marland Kitchen He monitors his sugars and they have been well controlled at home. He checks his feet daily and denies foot lesions. He is up-to-date with an ophthalmology examination.   CAD, Hypertension: He is taking his medication , but is out of his metoprolol and needs a refill. He is compliant with a low sodium diet.  He denies chest pain, palpitations, edema, shortness of breath and regular headaches. He is exercising regularly.     Hyperlipidemia: He is taking his medication daily. He is compliant with a low fat/cholesterol diet. He is exercising regularly. He denies myalgias.    Medications and allergies reviewed with patient and updated if appropriate.  Patient Active Problem List   Diagnosis Date Noted  . Basal cell carcinoma of face 09/18/2016  . Uncontrolled diabetes mellitus ([place]) 02/02/2016  . Oral lichen planus 03/88/8280  . Hx of colonic polyp 09/30/2013  . Essential hypertension 09/13/2010  . Seasonal allergic rhinitis 02/17/2010  . GILBERT'S SYNDROME 07/25/2009  . CORONARY ATHEROSCLEROSIS NATIVE CORONARY ARTERY 06/06/2009  . HYPERLIPIDEMIA 07/28/2007  . 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 aspirin EC 81 MG tablet Take 81 mg by mouth daily.    Marland Kitchen augmented betamethasone dipropionate (DIPROLENE-AF) 0.05 % cream Apply 1 application topically as directed.     . blood glucose meter kit and supplies KIT Dispense based on patient and insurance preference. Use up to four times daily as directed. (FOR E11.9). 1 each 0  . clobetasol (TEMOVATE)  0.05 % GEL Apply to the inside of mouth daily as need.    Marland Kitchen co-enzyme Q-10 30 MG capsule Take 100 mg by mouth as needed (supplement).     . hydrochlorothiazide (HYDRODIURIL) 25 MG tablet Take 1 tablet (25 mg total) by mouth daily. 90 tablet 3  . ibuprofen (ADVIL,MOTRIN) 200 MG tablet Take 200 mg by mouth 2 (two) times daily.     Marland Kitchen JARDIANCE 10 MG TABS tablet TAKE 1 TABLET BY MOUTH DAILY 30 tablet 4  . losartan (COZAAR) 100 MG tablet TAKE 1 TABLET BY MOUTH DAILY 90 tablet 0  . metFORMIN (GLUCOPHAGE) 1000 MG tablet Take 1 tablet (1,000 mg total) by mouth 2 (two) times daily with a meal. 180 tablet 3  . Multiple Vitamin (MULTIVITAMIN) tablet Take 1 tablet by mouth daily.      . ONE TOUCH ULTRA TEST test strip TEST UP TO FOUR TIMES DAILY AS DIRECTED 300 each 5  . ONETOUCH DELICA LANCETS 03K MISC TEST UP TO FOUR TIMES DAILY AS DIRECTED 100 each 0  . rosuvastatin (CRESTOR) 40 MG tablet Take 1 tablet (40 mg total) by mouth daily. Please keep upcoming appt in September. Thank you 90 tablet 0  . triamcinolone cream (KENALOG) 0.1 % Apply 1 application topically daily as needed (skin). Reported on 03/26/2016      No current facility-administered medications on file prior to visit.     Past Medical History:  Diagnosis Date  . Coronary artery disease   . Hyperlipidemia   . 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 Hypertension   . Lichen planus    oral  . Other abnormal glucose     Past Surgical History:  Procedure Laterality Date  . APPENDECTOMY    . CATARACT EXTRACTION W/ INTRAOCULAR LENS IMPLANT Right 2009   Dr Bing Plume  . CHOLECYSTECTOMY    . CORONARY ANGIOPLASTY WITH STENT PLACEMENT  2000  . REFRACTIVE SURGERY  1999   Dr Bing Plume  . TONSILLECTOMY    . VASECTOMY      Social History   Socioeconomic History  . Marital status: Married    Spouse name: Not on file  . Number of children: Not on file  . Years of education: Not on file  . Highest education level:  Not on file  Occupational History  . Occupation: retired Engineer, site: RETIRED  Social Needs  . Financial resource strain: Not on file  . Food insecurity:    Worry: Not on file    Inability: Not on file  . Transportation needs:    Medical: Not on file    Non-medical: Not on file  Tobacco Use  . Smoking status: Former Smoker    Last attempt to quit: 10/15/1997    Years since quitting: 20.7  . Smokeless tobacco: Never Used  . Tobacco comment: smoked 1961-1999, up to 1/2 ppd. Off 3-4 years 1961-1999. Cigar 3/week for 1 year  Substance and Sexual Activity  . Alcohol use: Yes    Alcohol/week: 0.0 standard drinks    Comment: socially  1-2/ day  . Drug use: No  . Sexual activity: Yes  Lifestyle  . Physical activity:    Days per week: Not on file    Minutes per session: Not on file  . Stress: Not on file  Relationships  . Social connections:    Talks on phone: Not on file    Gets together: Not on file    Attends religious service: Not on file    Active member of club or organization: Not on file    Attends meetings of clubs or organizations: Not on file    Relationship status: Not on file  Other Topics Concern  . Not on file  Social History Narrative  . Not on file    Family History  Problem Relation Age of Onset  . Coronary artery disease Father        MIx3. Initial MI @ 6  . Diabetes Father   . Stroke Father   . COPD Mother   . Diabetes Mother   . Hypertension Brother        DM- CABG  . Kidney failure Sister     Review of Systems  Constitutional: Negative for chills and fever.  Respiratory: Negative for cough, shortness of breath and wheezing.   Cardiovascular: Negative for chest pain, palpitations and leg swelling.  Neurological: Negative for light-headedness and headaches.  Psychiatric/Behavioral: Negative for sleep disturbance.       Objective:   Vitals:   07/15/18 0809  BP: (!) 142/82  Pulse: 60  Resp: 16  Temp: 98.1 F (36.7 C)  SpO2: 96%    BP Readings from Last 3 Encounters:  07/15/18 (!) 142/82  06/18/18 118/72  01/08/18 134/76   Wt Readings from Last 3 Encounters:  07/15/18 253 lb (114.8 kg)  06/18/18 256 lb (116.1 kg)  01/08/18 253 lb (114.8 kg)   Body mass index is 34.31 kg/m.   Physical Exam  Constitutional: Appears well-developed and well-nourished. No distress.  HENT:  Head: Normocephalic and atraumatic.  Neck: Neck supple. No tracheal deviation present. No thyromegaly present.  No cervical lymphadenopathy Cardiovascular: Normal rate, regular rhythm and normal heart sounds.   No murmur heard. No carotid bruit .  No edema Pulmonary/Chest: Effort normal and breath sounds normal. No respiratory distress. No has no wheezes. No rales.  Skin: Skin is warm and dry. Not diaphoretic.  Psychiatric: Normal mood and affect. Behavior is normal.   Diabetic Foot Exam - Simple   Simple Foot Form Diabetic Foot exam was performed with the following findings:  Yes 07/15/2018  8:40 AM  Visual Inspection No deformities, no ulcerations, no other skin breakdown bilaterally:  Yes Sensation Testing Intact to touch and monofilament testing bilaterally:  Yes Pulse Check Posterior Tibialis and Dorsalis pulse intact bilaterally:  Yes Comments     Lab Results  Component Value Date   HGBA1C 6.5 (A) 07/15/2018     Assessment & Plan:    See Problem List for Assessment and Plan of chronic medical problems.

## 2018-07-14 ENCOUNTER — Ambulatory Visit: Payer: PPO | Admitting: Internal Medicine

## 2018-07-15 ENCOUNTER — Encounter: Payer: Self-pay | Admitting: Internal Medicine

## 2018-07-15 ENCOUNTER — Ambulatory Visit (INDEPENDENT_AMBULATORY_CARE_PROVIDER_SITE_OTHER): Payer: PPO | Admitting: Internal Medicine

## 2018-07-15 VITALS — BP 142/82 | HR 60 | Temp 98.1°F | Resp 16 | Ht 72.0 in | Wt 253.0 lb

## 2018-07-15 DIAGNOSIS — I2510 Atherosclerotic heart disease of native coronary artery without angina pectoris: Secondary | ICD-10-CM

## 2018-07-15 DIAGNOSIS — E782 Mixed hyperlipidemia: Secondary | ICD-10-CM | POA: Diagnosis not present

## 2018-07-15 DIAGNOSIS — I10 Essential (primary) hypertension: Secondary | ICD-10-CM | POA: Diagnosis not present

## 2018-07-15 DIAGNOSIS — E119 Type 2 diabetes mellitus without complications: Secondary | ICD-10-CM

## 2018-07-15 LAB — POCT GLYCOSYLATED HEMOGLOBIN (HGB A1C): Hemoglobin A1C: 6.5 % — AB (ref 4.0–5.6)

## 2018-07-15 MED ORDER — METOPROLOL TARTRATE 25 MG PO TABS: 25.0000 mg | ORAL_TABLET | Freq: Two times a day (BID) | ORAL | 3 refills | Status: DC

## 2018-07-15 NOTE — Assessment & Plan Note (Signed)
Following with cardiology-up-to-date with visits No chest pain, palpitations or shortness of breath Continue current medications

## 2018-07-15 NOTE — Assessment & Plan Note (Signed)
Blood pressure slightly elevated here today, he has been out of his metoprolol for a couple of days Prescription sent to pharmacy for metoprolol Continue current medications at current doses since blood pressure is typically well controlled CMP done recently and was normal

## 2018-07-15 NOTE — Assessment & Plan Note (Signed)
Lipids have been well controlled Continue daily statin Regular exercise and healthy diet encouraged

## 2018-07-15 NOTE — Assessment & Plan Note (Signed)
A1c today 6.5%-well-controlled Continue current medications Continue diabetic and regular exercise Will work on weight loss Follow-up in 6 months

## 2018-07-16 ENCOUNTER — Ambulatory Visit: Payer: PPO | Admitting: Internal Medicine

## 2018-07-17 ENCOUNTER — Other Ambulatory Visit: Payer: Self-pay

## 2018-07-17 ENCOUNTER — Telehealth: Payer: Self-pay

## 2018-07-17 MED ORDER — IBUPROFEN-FAMOTIDINE 800-26.6 MG PO TABS: ORAL_TABLET | ORAL | 1 refills | Status: DC

## 2018-07-17 NOTE — Telephone Encounter (Signed)
Rx sent. Pt aware.  

## 2018-07-17 NOTE — Telephone Encounter (Signed)
Copied from [place] [phone]. Topic: General - Other >> Jul 17, 2018 11:51 AM Alfredia Ferguson R wrote: Patient is calling in and stating when he came into the office on 07/15/2018 he was advised that Dr Quay Burow was sending a script over for Diurexis and the script was never sent over

## 2018-08-03 ENCOUNTER — Other Ambulatory Visit: Payer: Self-pay | Admitting: Internal Medicine

## 2018-08-20 DIAGNOSIS — L821 Other seborrheic keratosis: Secondary | ICD-10-CM | POA: Diagnosis not present

## 2018-08-20 DIAGNOSIS — L570 Actinic keratosis: Secondary | ICD-10-CM | POA: Diagnosis not present

## 2018-08-20 DIAGNOSIS — Z85828 Personal history of other malignant neoplasm of skin: Secondary | ICD-10-CM | POA: Diagnosis not present

## 2018-08-20 DIAGNOSIS — D1801 Hemangioma of skin and subcutaneous tissue: Secondary | ICD-10-CM | POA: Diagnosis not present

## 2018-08-20 DIAGNOSIS — I788 Other diseases of capillaries: Secondary | ICD-10-CM | POA: Diagnosis not present

## 2018-08-20 DIAGNOSIS — L280 Lichen simplex chronicus: Secondary | ICD-10-CM | POA: Diagnosis not present

## 2018-08-25 ENCOUNTER — Other Ambulatory Visit: Payer: Self-pay | Admitting: Internal Medicine

## 2018-09-15 ENCOUNTER — Encounter: Payer: Self-pay | Admitting: Internal Medicine

## 2018-09-15 ENCOUNTER — Ambulatory Visit (INDEPENDENT_AMBULATORY_CARE_PROVIDER_SITE_OTHER): Payer: PPO | Admitting: Internal Medicine

## 2018-09-15 VITALS — BP 148/82 | HR 55 | Temp 97.9°F | Resp 16 | Ht 72.0 in | Wt 257.0 lb

## 2018-09-15 DIAGNOSIS — J069 Acute upper respiratory infection, unspecified: Secondary | ICD-10-CM | POA: Diagnosis not present

## 2018-09-15 MED ORDER — GUAIFENESIN ER 600 MG PO TB12: 1200.0000 mg | ORAL_TABLET | Freq: Two times a day (BID) | ORAL | 0 refills | Status: DC

## 2018-09-15 MED ORDER — AZITHROMYCIN 250 MG PO TABS: ORAL_TABLET | ORAL | 0 refills | Status: DC

## 2018-09-15 MED ORDER — HYDROCODONE-HOMATROPINE 5-1.5 MG/5ML PO SYRP: 5.0000 mL | ORAL_SOLUTION | Freq: Three times a day (TID) | ORAL | 0 refills | Status: DC | PRN

## 2018-09-15 NOTE — Patient Instructions (Addendum)
Take the antibiotic only if needed - if your symptoms do not improve over the next few days.    Use the cough syrup as needed.  Take mucinex.   Continue over the counter cold medication, advil and tylenol.  Increase your fluids and rest.    Call if no improvement

## 2018-09-15 NOTE — Assessment & Plan Note (Signed)
Likely viral Start mucinex, hycodan cough syrup, anti-histamine zpak written and I advised him to only take if needed - if his symptoms do not improve over the next few days Rest, fluids Call if no improvement

## 2018-09-15 NOTE — Progress Notes (Signed)
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   MRN: [id number]  HPI He is here for an acute visit for cold symptoms.  His symptoms started after seeing his grandkids at Thanksgiving last week.   He is experiencing a productive cough with discolored sputum.  He has heard some intermittent rattling in his chest, but denies any SOB or wheeze today.  He had some ear pain last night.  He denies fever, chills, nasal congestion, sore throat, headaches.   He has tried taking anti-histamine.     Medications and allergies reviewed with patient and updated if appropriate.  Patient Active Problem List   Diagnosis Date Noted  . Basal cell carcinoma of face 09/18/2016  . Diabetes ([place]) 02/02/2016  . Oral lichen planus 12/87/8676  . Hx of colonic polyp 09/30/2013  . Essential hypertension 09/13/2010  . Seasonal allergic rhinitis 02/17/2010  . GILBERT'S SYNDROME 07/25/2009  . CORONARY ATHEROSCLEROSIS NATIVE CORONARY ARTERY 06/06/2009  . HYPERLIPIDEMIA 07/28/2007  . 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 aspirin EC 81 MG tablet Take 81 mg by mouth daily.    Marland Kitchen augmented betamethasone dipropionate (DIPROLENE-AF) 0.05 % cream Apply 1 application topically as directed.     . blood glucose meter kit and supplies KIT Dispense based on patient and insurance preference. Use up to four times daily as directed. (FOR E11.9). 1 each 0  . clobetasol (TEMOVATE) 0.05 % GEL Apply to the inside of mouth daily as need.    Marland Kitchen co-enzyme Q-10 30 MG capsule Take 100 mg by mouth as needed (supplement).     . hydrochlorothiazide (HYDRODIURIL) 25 MG tablet Take 1 tablet (25 mg total) by mouth daily. 90 tablet 3  . ibuprofen (ADVIL,MOTRIN) 200 MG tablet Take 200 mg by mouth 2 (two) times daily.     . Ibuprofen-Famotidine 800-26.6 MG TABS Take 1 tablet by mouth every 8 hours as  needed for pain 90 tablet 1  . JARDIANCE 10 MG TABS tablet TAKE 1 TABLET BY MOUTH DAILY 30 tablet 4  . losartan (COZAAR) 100 MG tablet TAKE 1 TABLET BY MOUTH DAILY 90 tablet 1  . metFORMIN (GLUCOPHAGE) 1000 MG tablet TAKE 1 TABLET(1000 MG) BY MOUTH TWICE DAILY WITH A MEAL 180 tablet 0  . metoprolol tartrate (LOPRESSOR) 25 MG tablet Take 1 tablet (25 mg total) by mouth 2 (two) times daily. 180 tablet 3  . Multiple Vitamin (MULTIVITAMIN) tablet Take 1 tablet by mouth daily.      . ONE TOUCH ULTRA TEST test strip TEST UP TO FOUR TIMES DAILY AS DIRECTED 300 each 5  . ONETOUCH DELICA LANCETS 72C MISC TEST UP TO FOUR TIMES DAILY AS DIRECTED 100 each 0  . rosuvastatin (CRESTOR) 40 MG tablet Take 1 tablet (40 mg total) by mouth daily. Please keep upcoming appt in September. Thank you 90 tablet 0  . triamcinolone cream (KENALOG) 0.1 % Apply 1 application topically daily as needed (skin). Reported on 03/26/2016      No current facility-administered medications on file prior to visit.     Past Medical History:  Diagnosis Date  . Coronary artery disease   . Hyperlipidemia   . 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 Hypertension   . Lichen planus    oral  . Other abnormal glucose  Past Surgical History:  Procedure Laterality Date  . APPENDECTOMY    . CATARACT EXTRACTION W/ INTRAOCULAR LENS IMPLANT Right 2009   Dr Digby  . CHOLECYSTECTOMY    . CORONARY ANGIOPLASTY WITH STENT PLACEMENT  2000  . REFRACTIVE SURGERY  1999   Dr Digby  . TONSILLECTOMY    . VASECTOMY      Social History   Socioeconomic History  . Marital status: Married    Spouse name: Not on file  . Number of children: Not on file  . Years of education: Not on file  . Highest education level: Not on file  Occupational History  . Occupation: retired dentist    Employer: RETIRED  Social Needs  . Financial resource strain: Not on file  . Food insecurity:    Worry: Not on file     Inability: Not on file  . Transportation needs:    Medical: Not on file    Non-medical: Not on file  Tobacco Use  . Smoking status: Former Smoker    Last attempt to quit: 10/15/1997    Years since quitting: 20.9  . Smokeless tobacco: Never Used  . Tobacco comment: smoked 1961-1999, up to 1/2 ppd. Off 3-4 years 1961-1999. Cigar 3/week for 1 year  Substance and Sexual Activity  . Alcohol use: Yes    Alcohol/week: 0.0 standard drinks    Comment: socially  1-2/ day  . Drug use: No  . Sexual activity: Yes  Lifestyle  . Physical activity:    Days per week: Not on file    Minutes per session: Not on file  . Stress: Not on file  Relationships  . Social connections:    Talks on phone: Not on file    Gets together: Not on file    Attends religious service: Not on file    Active member of club or organization: Not on file    Attends meetings of clubs or organizations: Not on file    Relationship status: Not on file  Other Topics Concern  . Not on file  Social History Narrative  . Not on file    Family History  Problem Relation Age of Onset  . Coronary artery disease Father        MIx3. Initial MI @ 66  . Diabetes Father   . Stroke Father   . COPD Mother   . Diabetes Mother   . Hypertension Brother        DM- CABG  . Kidney failure Sister     Review of Systems  Constitutional: Negative for chills and fever.  HENT: Positive for ear pain (last night only). Negative for congestion, sinus pain and sore throat.   Respiratory: Positive for cough (productive - discolored). Negative for chest tightness, shortness of breath and wheezing.   Gastrointestinal: Negative for constipation and diarrhea.  Musculoskeletal: Negative for myalgias.  Neurological: Negative for dizziness, light-headedness and headaches.       Objective:   Vitals:   09/15/18 1007  BP: (!) 148/82  Pulse: (!) 55  Resp: 16  Temp: 97.9 F (36.6 C)  SpO2: 97%   Filed Weights   09/15/18 1007  Weight: 257  lb (116.6 kg)   Body mass index is 34.86 kg/m.  Wt Readings from Last 3 Encounters:  09/15/18 257 lb (116.6 kg)  07/15/18 253 lb (114.8 kg)  06/18/18 256 lb (116.1 kg)     Physical Exam GENERAL APPEARANCE: Appears stated age, well appearing, NAD EYES: conjunctiva clear, no   icterus HEENT: bilateral tympanic membranes and ear canals normal, oropharynx with no erythema, no thyromegaly, trachea midline, no cervical or supraclavicular lymphadenopathy LUNGS: Clear to auscultation without wheeze or crackles, unlabored breathing, good air entry bilaterally CARDIOVASCULAR: Normal S1,S2 without murmurs, no edema SKIN: warm, dry        Assessment & Plan:   See Problem List for Assessment and Plan of chronic medical problems.  

## 2018-10-14 ENCOUNTER — Other Ambulatory Visit: Payer: Self-pay | Admitting: Cardiovascular Disease

## 2018-11-03 ENCOUNTER — Other Ambulatory Visit: Payer: Self-pay | Admitting: Internal Medicine

## 2018-12-08 ENCOUNTER — Ambulatory Visit (INDEPENDENT_AMBULATORY_CARE_PROVIDER_SITE_OTHER): Payer: PPO | Admitting: Family

## 2018-12-08 ENCOUNTER — Encounter: Payer: Self-pay | Admitting: Family

## 2018-12-08 VITALS — BP 118/68 | HR 63 | Temp 98.2°F | Ht 72.0 in | Wt 254.1 lb

## 2018-12-08 DIAGNOSIS — J209 Acute bronchitis, unspecified: Secondary | ICD-10-CM

## 2018-12-08 MED ORDER — AZITHROMYCIN 250 MG PO TABS: ORAL_TABLET | ORAL | 0 refills | Status: DC

## 2018-12-08 MED ORDER — HYDROCODONE-HOMATROPINE 5-1.5 MG/5ML PO SYRP: 5.0000 mL | ORAL_SOLUTION | Freq: Three times a day (TID) | ORAL | 0 refills | Status: DC | PRN

## 2018-12-08 MED ORDER — IBUPROFEN 800 MG PO TABS: 800.0000 mg | ORAL_TABLET | Freq: Three times a day (TID) | ORAL | 0 refills | Status: DC | PRN

## 2018-12-08 MED ORDER — FAMOTIDINE 20 MG PO TABS: 20.0000 mg | ORAL_TABLET | Freq: Two times a day (BID) | ORAL | 0 refills | Status: DC

## 2018-12-08 NOTE — Progress Notes (Signed)
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 Upper respiratory tract infection 09/15/2018  . Basal cell carcinoma of face 09/18/2016  . Diabetes ([place]) 02/02/2016  . Oral lichen planus 16/07/9603  . Hx of colonic polyp 09/30/2013  . Essential hypertension 09/13/2010  . Seasonal allergic rhinitis 02/17/2010  . GILBERT'S SYNDROME 07/25/2009  . CORONARY ATHEROSCLEROSIS NATIVE CORONARY ARTERY 06/06/2009  . HYPERLIPIDEMIA 07/28/2007  . 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 aspirin EC 81 MG tablet Take 81 mg by mouth daily.    Marland Kitchen augmented betamethasone dipropionate (DIPROLENE-AF) 0.05 % cream Apply 1 application topically as directed.     . blood glucose meter kit and supplies KIT Dispense based on patient and insurance preference. Use up to four times daily as directed. (FOR E11.9). 1 each 0  . clobetasol (TEMOVATE) 0.05 % GEL Apply to the inside of mouth daily as need.    Marland Kitchen co-enzyme Q-10 30 MG capsule Take 100 mg by mouth as needed (supplement).     . hydrochlorothiazide (HYDRODIURIL) 25 MG tablet Take 1 tablet (25 mg total) by mouth daily. 90 tablet 3  . HYDROcodone-homatropine (HYCODAN) 5-1.5 MG/5ML syrup Take 5 mLs by mouth every 8 (eight) hours as needed for cough. 120 mL 0  . ibuprofen (ADVIL,MOTRIN) 200 MG tablet Take 200 mg by mouth 2 (two) times daily.     . Ibuprofen-Famotidine 800-26.6 MG TABS Take 1 tablet by mouth every 8 hours as needed for pain 90 tablet 1  . JARDIANCE 10 MG TABS tablet TAKE 1 TABLET BY MOUTH DAILY 30 tablet 4  . losartan (COZAAR) 100 MG tablet TAKE 1 TABLET BY MOUTH DAILY 90 tablet 1  . metFORMIN (GLUCOPHAGE) 1000 MG tablet TAKE 1 TABLET(1000 MG) BY MOUTH TWICE DAILY WITH A MEAL 180 tablet 0  . metoprolol tartrate (LOPRESSOR) 25 MG tablet Take 1 tablet (25 mg total) by mouth 2  (two) times daily. 180 tablet 3  . Multiple Vitamin (MULTIVITAMIN) tablet Take 1 tablet by mouth daily.      . ONE TOUCH ULTRA TEST test strip TEST UP TO FOUR TIMES DAILY AS DIRECTED 300 each 5  . ONETOUCH DELICA LANCETS 54U MISC TEST UP TO FOUR TIMES DAILY AS DIRECTED 100 each 0  . rosuvastatin (CRESTOR) 40 MG tablet TAKE 1 TABLET BY MOUTH DAILY 90 tablet 0  . triamcinolone cream (KENALOG) 0.1 % Apply 1 application topically daily as needed (skin). Reported on 03/26/2016     . azithromycin (ZITHROMAX) 250 MG tablet Take two tabs the first day and then one tab daily for four days 6 tablet 0  . famotidine (PEPCID) 20 MG tablet Take 1 tablet (20 mg total) by mouth 2 (two) times daily. 180 tablet 0  . ibuprofen (ADVIL,MOTRIN) 800 MG tablet Take 1 tablet (800 mg total) by mouth every 8 (eight) hours as needed. 90 tablet 0   No current facility-administered medications for this visit.     Allergies: Ramipril  Past Medical History:  Diagnosis Date  . Coronary artery disease   . Hyperlipidemia   . 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 Hypertension   . Lichen planus    oral  . Other abnormal glucose     Past Surgical History:  Procedure Laterality Date  . APPENDECTOMY    .  CATARACT EXTRACTION W/ INTRAOCULAR LENS IMPLANT Right 2009   Dr Bing Plume  . CHOLECYSTECTOMY    . CORONARY ANGIOPLASTY WITH STENT PLACEMENT  2000  . REFRACTIVE SURGERY  1999   Dr Bing Plume  . TONSILLECTOMY    . VASECTOMY      Family History  Problem Relation Age of Onset  . Coronary artery disease Father        MIx3. Initial MI @ 15  . Diabetes Father   . Stroke Father   . COPD Mother   . Diabetes Mother   . Hypertension Brother        DM- CABG  . Kidney failure Sister     Social History   Tobacco Use  . Smoking status: Former Smoker    Last attempt to quit: 10/15/1997    Years since quitting: 21.1  . Smokeless tobacco: Never Used  . Tobacco comment: smoked 1961-1999, up to 1/2  ppd. Off 3-4 years 1961-1999. Cigar 3/week for 1 year  Substance Use Topics  . Alcohol use: Yes    Alcohol/week: 0.0 standard drinks    Comment: socially  1-2/ day    Subjective:  Patient presents with concerns for 4 day history of cough/ congestion- +productive cough/ "brown mucus." No headache, no ear pain; similar symptoms in December- responded well to Z-pak; would like to have same prescription;     Objective:  Vitals:   12/08/18 0959  BP: 118/68  Pulse: 63  Temp: 98.2 F (36.8 C)  TempSrc: Oral  SpO2: 95%  Weight: 254 lb 1.9 oz (115.3 kg)  Height: 6' (1.829 m)    General: Well developed, well nourished, in no acute distress  Skin : Warm and dry.  Head: Normocephalic and atraumatic  Eyes: Sclera and conjunctiva clear; pupils round and reactive to light; extraocular movements intact  Ears: External normal; canals clear; tympanic membranes normal  Oropharynx: Pink, supple. No suspicious lesions  Neck: Supple without thyromegaly, adenopathy  Lungs: Respirations unlabored; clear to auscultation bilaterally without wheeze, rales, rhonchi  CVS exam: normal rate, regular rhythm, normal S1, S2, no murmurs, rubs, clicks or gallops, normal rate and regular rhythm.  Neurologic: Alert and oriented; speech intact; face symmetrical; moves all extremities well; CNII-XII intact without focal deficit   Assessment:  1. Acute bronchitis, unspecified organism     Plan:  Rx for Z-pak #1 take as directed, refill on Hycodan; increase fluids, rest and follow-up worse, no better.   No follow-ups on file.  No orders of the defined types were placed in this encounter.   Requested Prescriptions   Signed Prescriptions Disp Refills  . azithromycin (ZITHROMAX) 250 MG tablet 6 tablet 0    Sig: Take two tabs the first day and then one tab daily for four days  . HYDROcodone-homatropine (HYCODAN) 5-1.5 MG/5ML syrup 120 mL 0    Sig: Take 5 mLs by mouth every 8 (eight) hours as needed for cough.  Marland Kitchen  ibuprofen (ADVIL,MOTRIN) 800 MG tablet 90 tablet 0    Sig: Take 1 tablet (800 mg total) by mouth every 8 (eight) hours as needed.  . famotidine (PEPCID) 20 MG tablet 180 tablet 0    Sig: Take 1 tablet (20 mg total) by mouth 2 (two) times daily.

## 2018-12-16 ENCOUNTER — Other Ambulatory Visit: Payer: Self-pay | Admitting: Internal Medicine

## 2019-01-14 ENCOUNTER — Ambulatory Visit: Payer: PPO | Admitting: Internal Medicine

## 2019-02-04 ENCOUNTER — Ambulatory Visit: Payer: PPO | Admitting: Internal Medicine

## 2019-02-22 ENCOUNTER — Other Ambulatory Visit: Payer: Self-pay | Admitting: Internal Medicine

## 2019-03-04 ENCOUNTER — Other Ambulatory Visit: Payer: Self-pay | Admitting: Internal Medicine

## 2019-03-05 ENCOUNTER — Ambulatory Visit: Payer: PPO | Admitting: Internal Medicine

## 2019-03-17 ENCOUNTER — Other Ambulatory Visit: Payer: Self-pay | Admitting: Cardiovascular Disease

## 2019-03-17 ENCOUNTER — Telehealth: Payer: Self-pay | Admitting: Internal Medicine

## 2019-03-17 DIAGNOSIS — Z1159 Encounter for screening for other viral diseases: Secondary | ICD-10-CM

## 2019-03-17 NOTE — Telephone Encounter (Signed)
Tried calling pt to let him know. No answer and the mailbox was full.

## 2019-03-17 NOTE — Telephone Encounter (Signed)
Copied from [place] [phone]. Topic: Quick Communication - See Telephone Encounter >> Mar 17, 2019  9:09 AM Margot Ables wrote: CRM for notification. See Telephone encounter for: 03/17/19.  Pt is asking if he can have COVID19 antibody testing. He had appt at [facility] but was told PCP needs to order. Pt would prefer to come to [facility] lab if possible. Pt notes he was sick back in Nov-Dec 2019 with COVID19 symptoms but now is feeling fine. He is waiting on COVID results from the health dept just as a precaution. 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 Please call pt to schedule antibody testing if ok

## 2019-03-17 NOTE — Telephone Encounter (Signed)
Lab ordered.

## 2019-03-18 NOTE — Telephone Encounter (Signed)
Tried calling pt again to let him know.

## 2019-03-19 NOTE — Telephone Encounter (Signed)
Patient calling in to return [place] call. Call back is [phone].

## 2019-03-20 ENCOUNTER — Other Ambulatory Visit: Payer: PPO

## 2019-03-20 DIAGNOSIS — Z1159 Encounter for screening for other viral diseases: Secondary | ICD-10-CM

## 2019-03-20 NOTE — Telephone Encounter (Signed)
Wife aware that labs have been placed and will let pt know.

## 2019-03-21 ENCOUNTER — Encounter: Payer: Self-pay | Admitting: Internal Medicine

## 2019-03-21 LAB — SAR COV2 SEROLOGY (COVID19)AB(IGG),IA: SARS CoV2 AB IGG: NEGATIVE

## 2019-03-27 ENCOUNTER — Telehealth: Payer: Self-pay | Admitting: Emergency Medicine

## 2019-03-27 DIAGNOSIS — I10 Essential (primary) hypertension: Secondary | ICD-10-CM

## 2019-03-27 DIAGNOSIS — E119 Type 2 diabetes mellitus without complications: Secondary | ICD-10-CM

## 2019-03-27 DIAGNOSIS — E782 Mixed hyperlipidemia: Secondary | ICD-10-CM

## 2019-03-27 NOTE — Telephone Encounter (Signed)
Pt has appt on 6/24 and would like to have blood work done before appt. Please advise.

## 2019-03-27 NOTE — Telephone Encounter (Signed)
ordered

## 2019-04-02 ENCOUNTER — Ambulatory Visit: Payer: PPO | Admitting: Internal Medicine

## 2019-04-03 ENCOUNTER — Other Ambulatory Visit (INDEPENDENT_AMBULATORY_CARE_PROVIDER_SITE_OTHER): Payer: PPO

## 2019-04-03 DIAGNOSIS — E119 Type 2 diabetes mellitus without complications: Secondary | ICD-10-CM

## 2019-04-03 DIAGNOSIS — I10 Essential (primary) hypertension: Secondary | ICD-10-CM

## 2019-04-03 DIAGNOSIS — E782 Mixed hyperlipidemia: Secondary | ICD-10-CM | POA: Diagnosis not present

## 2019-04-03 LAB — CBC WITH DIFFERENTIAL/PLATELET
Basophils Absolute: 0.1 10*3/uL (ref 0.0–0.1)
Basophils Relative: 0.6 % (ref 0.0–3.0)
Eosinophils Absolute: 0.3 10*3/uL (ref 0.0–0.7)
Eosinophils Relative: 3.3 % (ref 0.0–5.0)
HCT: 52.7 % — ABNORMAL HIGH (ref 39.0–52.0)
Hemoglobin: 17.9 g/dL — ABNORMAL HIGH (ref 13.0–17.0)
Lymphocytes Relative: 19.6 % (ref 12.0–46.0)
Lymphs Abs: 1.8 10*3/uL (ref 0.7–4.0)
MCHC: 34 g/dL (ref 30.0–36.0)
MCV: 95.4 fl (ref 78.0–100.0)
Monocytes Absolute: 1 10*3/uL (ref 0.1–1.0)
Monocytes Relative: 10.8 % (ref 3.0–12.0)
Neutro Abs: 5.9 10*3/uL (ref 1.4–7.7)
Neutrophils Relative %: 65.7 % (ref 43.0–77.0)
Platelets: 183 10*3/uL (ref 150.0–400.0)
RBC: 5.52 Mil/uL (ref 4.22–5.81)
RDW: 13.3 % (ref 11.5–15.5)
WBC: 9 10*3/uL (ref 4.0–10.5)

## 2019-04-03 LAB — COMPREHENSIVE METABOLIC PANEL
ALT: 21 U/L (ref 0–53)
AST: 17 U/L (ref 0–37)
Albumin: 4.7 g/dL (ref 3.5–5.2)
Alkaline Phosphatase: 48 U/L (ref 39–117)
BUN: 25 mg/dL — ABNORMAL HIGH (ref 6–23)
CO2: 24 mEq/L (ref 19–32)
Calcium: 9.9 mg/dL (ref 8.4–10.5)
Chloride: 102 mEq/L (ref 96–112)
Creatinine, Ser: 1.18 mg/dL (ref 0.40–1.50)
GFR: 60.08 mL/min (ref >60.00)
Glucose, Bld: 134 mg/dL — ABNORMAL HIGH (ref 70–99)
Potassium: 5.5 mEq/L — ABNORMAL HIGH (ref 3.5–5.1)
Sodium: 139 mEq/L (ref 135–145)
Total Bilirubin: 1.4 mg/dL — ABNORMAL HIGH (ref 0.2–1.2)
Total Protein: 6.7 g/dL (ref 6.0–8.3)

## 2019-04-03 LAB — LIPID PANEL
Cholesterol: 103 mg/dL (ref 0–200)
HDL: 36.1 mg/dL — ABNORMAL LOW (ref >39.00)
LDL Cholesterol: 50 mg/dL (ref 0–99)
NonHDL: 67.35
Total CHOL/HDL Ratio: 3
Triglycerides: 85 mg/dL (ref 0.0–149.0)
VLDL: 17 mg/dL (ref 0.0–40.0)

## 2019-04-03 LAB — TSH: TSH: 2.88 u[IU]/mL (ref 0.35–4.50)

## 2019-04-03 LAB — HEMOGLOBIN A1C: Hgb A1c MFr Bld: 7.1 % — ABNORMAL HIGH (ref 4.6–6.5)

## 2019-04-03 NOTE — Telephone Encounter (Signed)
Did you call patient? Please call back

## 2019-04-03 NOTE — Telephone Encounter (Signed)
Pt screened

## 2019-04-03 NOTE — Telephone Encounter (Signed)
Tried contacting pt back, I did not call pt and would not be calling to screen yet for appt on Wed. Couldn't LVM due to mail box being full.

## 2019-04-06 ENCOUNTER — Other Ambulatory Visit: Payer: Self-pay | Admitting: Internal Medicine

## 2019-04-07 NOTE — Patient Instructions (Addendum)
  Tests ordered today. Your results will be released to MyChart (or called to you) after review, usually within 72hours after test completion. If any changes need to be made, you will be notified at that same time.  Medications reviewed and updated.  Changes include :   none      Please followup in 6 months   

## 2019-04-07 NOTE — Progress Notes (Signed)
Subjective:    Patient ID: [name], [name], male    DOB: [birth year], 78 y.o.   MRN: [id number]  HPI The patient is here for follow up.  He is not exercising regularly.     CAD, Hypertension: He is taking his medication daily. He is compliant with a low sodium diet.  He denies chest pain, palpitations, edema, shortness of breath and regular headaches.  He does not monitor his blood pressure at home.    Diabetes: He is taking his medication daily as prescribed. He is compliant with a diabetic diet.   He checks his feet daily and denies foot lesions. He is up-to-date with an ophthalmology examination.   Hyperlipidemia: He is taking his medication daily. He is compliant with a low fat/cholesterol diet. He denies myalgias.     Medications and allergies reviewed with patient and updated if appropriate.  Patient Active Problem List   Diagnosis Date Noted  . Basal cell carcinoma of face 09/18/2016  . Diabetes ([place]) 02/02/2016  . Oral lichen planus 77/41/4239  . Hx of colonic polyp 09/30/2013  . Essential hypertension 09/13/2010  . Seasonal allergic rhinitis 02/17/2010  . GILBERT'S SYNDROME 07/25/2009  . CORONARY ATHEROSCLEROSIS NATIVE CORONARY ARTERY 06/06/2009  . HYPERLIPIDEMIA 07/28/2007  . 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 aspirin EC 81 MG tablet Take 81 mg by mouth daily.    Marland Kitchen augmented betamethasone dipropionate (DIPROLENE-AF) 0.05 % cream Apply 1 application topically as directed.     . blood glucose meter kit and supplies KIT Dispense based on patient and insurance preference. Use up to four times daily as directed. (FOR E11.9). 1 each 0  . clobetasol (TEMOVATE) 0.05 % GEL Apply to the inside of mouth daily as need.    Marland Kitchen co-enzyme Q-10 30 MG capsule Take 100 mg by mouth as needed (supplement).     . famotidine (PEPCID) 20 MG tablet Take 1 tablet (20 mg total)  by mouth 2 (two) times daily. 180 tablet 0  . hydrochlorothiazide (HYDRODIURIL) 25 MG tablet Take 1 tablet (25 mg total) by mouth daily. 90 tablet 3  . ibuprofen (ADVIL,MOTRIN) 800 MG tablet Take 1 tablet (800 mg total) by mouth every 8 (eight) hours as needed. 90 tablet 0  . Ibuprofen-Famotidine 800-26.6 MG TABS Take 1 tablet by mouth every 8 hours as needed for pain 90 tablet 1  . JARDIANCE 10 MG TABS tablet TAKE 1 TABLET BY MOUTH EVERY DAY 90 tablet 0  . losartan (COZAAR) 100 MG tablet TAKE 1 TABLET BY MOUTH DAILY 90 tablet 0  . metFORMIN (GLUCOPHAGE) 1000 MG tablet TAKE 1 TABLET(1000 MG) BY MOUTH TWICE DAILY WITH A MEAL 180 tablet 0  . metoprolol tartrate (LOPRESSOR) 25 MG tablet Take 1 tablet (25 mg total) by mouth 2 (two) times daily. 180 tablet 3  . Multiple Vitamin (MULTIVITAMIN) tablet Take 1 tablet by mouth daily.      . ONE TOUCH ULTRA TEST test strip TEST UP TO FOUR TIMES DAILY AS DIRECTED 300 each 5  . ONETOUCH DELICA LANCETS 53U MISC TEST UP TO FOUR TIMES DAILY AS DIRECTED 100 each 0  . rosuvastatin (CRESTOR) 40 MG tablet TAKE 1 TABLET BY MOUTH DAILY 90 tablet 1  . triamcinolone cream (KENALOG) 0.1 % Apply 1 application topically daily as needed (skin). Reported on 03/26/2016      No current facility-administered medications  on file prior to visit.     Past Medical History:  Diagnosis Date  . Coronary artery disease   . Hyperlipidemia   . 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 Hypertension   . Lichen planus    oral  . Other abnormal glucose     Past Surgical History:  Procedure Laterality Date  . APPENDECTOMY    . CATARACT EXTRACTION W/ INTRAOCULAR LENS IMPLANT Right 2009   Dr Bing Plume  . CHOLECYSTECTOMY    . CORONARY ANGIOPLASTY WITH STENT PLACEMENT  2000  . REFRACTIVE SURGERY  1999   Dr Bing Plume  . TONSILLECTOMY    . VASECTOMY      Social History   Socioeconomic History  . Marital status: Married    Spouse name: Not on file  .  Number of children: Not on file  . Years of education: Not on file  . Highest education level: Not on file  Occupational History  . Occupation: retired Engineer, site: RETIRED  Social Needs  . Financial resource strain: Not on file  . Food insecurity    Worry: Not on file    Inability: Not on file  . Transportation needs    Medical: Not on file    Non-medical: Not on file  Tobacco Use  . Smoking status: Former Smoker    Quit date: 10/15/1997    Years since quitting: 21.4  . Smokeless tobacco: Never Used  . Tobacco comment: smoked 1961-1999, up to 1/2 ppd. Off 3-4 years 1961-1999. Cigar 3/week for 1 year  Substance and Sexual Activity  . Alcohol use: Yes    Alcohol/week: 0.0 standard drinks    Comment: socially  1-2/ day  . Drug use: No  . Sexual activity: Yes  Lifestyle  . Physical activity    Days per week: Not on file    Minutes per session: Not on file  . Stress: Not on file  Relationships  . Social Herbalist on phone: Not on file    Gets together: Not on file    Attends religious service: Not on file    Active member of club or organization: Not on file    Attends meetings of clubs or organizations: Not on file    Relationship status: Not on file  Other Topics Concern  . Not on file  Social History Narrative  . Not on file    Family History  Problem Relation Age of Onset  . Coronary artery disease Father        MIx3. Initial MI @ 75  . Diabetes Father   . Stroke Father   . COPD Mother   . Diabetes Mother   . Hypertension Brother        DM- CABG  . Kidney failure Sister     Review of Systems  Constitutional: Negative for chills and fever.  Respiratory: Negative for cough, shortness of breath and wheezing.   Cardiovascular: Negative for chest pain, palpitations and leg swelling.  Neurological: Negative for headaches.       Objective:   Vitals:   04/08/19 0933  BP: (!) 144/70  Pulse: (!) 59  Resp: 16  Temp: 98.2 F (36.8 C)   SpO2: 98%   BP Readings from Last 3 Encounters:  04/08/19 (!) 144/70  12/08/18 118/68  09/15/18 (!) 148/82   Wt Readings from Last 3 Encounters:  04/08/19 251 lb 12.8 oz (114.2 kg)  12/08/18 254 lb  1.9 oz (115.3 kg)  09/15/18 257 lb (116.6 kg)   Body mass index is 34.15 kg/m.   Physical Exam    Constitutional: Appears well-developed and well-nourished. No distress.  HENT:  Head: Normocephalic and atraumatic.  Neck: Neck supple. No tracheal deviation present. No thyromegaly present.  No cervical lymphadenopathy Cardiovascular: Normal rate, regular rhythm and normal heart sounds.  No murmur heard. No carotid bruit .  No edema Pulmonary/Chest: Effort normal and breath sounds normal. No respiratory distress. No has no wheezes. No rales.  Skin: Skin is warm and dry. Not diaphoretic.  Psychiatric: Normal mood and affect. Behavior is normal.      Assessment & Plan:    See Problem List for Assessment and Plan of chronic medical problems.

## 2019-04-08 ENCOUNTER — Ambulatory Visit (INDEPENDENT_AMBULATORY_CARE_PROVIDER_SITE_OTHER): Payer: PPO | Admitting: Internal Medicine

## 2019-04-08 ENCOUNTER — Other Ambulatory Visit: Payer: Self-pay

## 2019-04-08 ENCOUNTER — Encounter: Payer: Self-pay | Admitting: Internal Medicine

## 2019-04-08 ENCOUNTER — Other Ambulatory Visit (INDEPENDENT_AMBULATORY_CARE_PROVIDER_SITE_OTHER): Payer: PPO

## 2019-04-08 VITALS — BP 144/70 | HR 59 | Temp 98.2°F | Resp 16 | Ht 72.0 in | Wt 251.8 lb

## 2019-04-08 DIAGNOSIS — I2510 Atherosclerotic heart disease of native coronary artery without angina pectoris: Secondary | ICD-10-CM

## 2019-04-08 DIAGNOSIS — I10 Essential (primary) hypertension: Secondary | ICD-10-CM

## 2019-04-08 DIAGNOSIS — E782 Mixed hyperlipidemia: Secondary | ICD-10-CM

## 2019-04-08 DIAGNOSIS — E119 Type 2 diabetes mellitus without complications: Secondary | ICD-10-CM | POA: Diagnosis not present

## 2019-04-08 DIAGNOSIS — E875 Hyperkalemia: Secondary | ICD-10-CM | POA: Diagnosis not present

## 2019-04-08 LAB — BASIC METABOLIC PANEL
BUN: 18 mg/dL (ref 6–23)
CO2: 26 mEq/L (ref 19–32)
Calcium: 9.8 mg/dL (ref 8.4–10.5)
Chloride: 103 mEq/L (ref 96–112)
Creatinine, Ser: 1.08 mg/dL (ref 0.40–1.50)
GFR: 66.55 mL/min (ref >60.00)
Glucose, Bld: 132 mg/dL — ABNORMAL HIGH (ref 70–99)
Potassium: 4.8 mEq/L (ref 3.5–5.1)
Sodium: 138 mEq/L (ref 135–145)

## 2019-04-08 NOTE — Assessment & Plan Note (Signed)
With recent labs  bmp today

## 2019-04-08 NOTE — Assessment & Plan Note (Signed)
Cholesterol controlled Continue statin

## 2019-04-08 NOTE — Assessment & Plan Note (Addendum)
BP controlled at home BP controlled overall, but slightly elevated here Current regimen effective and well tolerated Continue current medications at current doses Work on weight loss, start exercising

## 2019-04-08 NOTE — Assessment & Plan Note (Signed)
Lab Results  Component Value Date   HGBA1C 7.1 (H) 04/03/2019   Sugars should ideally be slightly better controlled Encouraged him to start exercising on a regular basis and work on weight loss Low sugar/carbohydrate diet We will hold off on making any changes to his medication at this point since he is very close to goal

## 2019-04-08 NOTE — Assessment & Plan Note (Signed)
No chest pain, palpitations or shortness of breath Continue current medications Stressed regular exercise and weight loss

## 2019-05-05 ENCOUNTER — Other Ambulatory Visit: Payer: Self-pay | Admitting: Internal Medicine

## 2019-05-19 ENCOUNTER — Other Ambulatory Visit: Payer: Self-pay

## 2019-05-19 MED ORDER — ONETOUCH DELICA LANCETS 33G MISC: 2 refills | Status: DC

## 2019-05-31 ENCOUNTER — Other Ambulatory Visit: Payer: Self-pay | Admitting: Internal Medicine

## 2019-06-07 ENCOUNTER — Other Ambulatory Visit: Payer: Self-pay | Admitting: Internal Medicine

## 2019-08-24 DIAGNOSIS — D2261 Melanocytic nevi of right upper limb, including shoulder: Secondary | ICD-10-CM | POA: Diagnosis not present

## 2019-08-24 DIAGNOSIS — D1801 Hemangioma of skin and subcutaneous tissue: Secondary | ICD-10-CM | POA: Diagnosis not present

## 2019-08-24 DIAGNOSIS — L821 Other seborrheic keratosis: Secondary | ICD-10-CM | POA: Diagnosis not present

## 2019-08-24 DIAGNOSIS — Z85828 Personal history of other malignant neoplasm of skin: Secondary | ICD-10-CM | POA: Diagnosis not present

## 2019-08-24 DIAGNOSIS — L570 Actinic keratosis: Secondary | ICD-10-CM | POA: Diagnosis not present

## 2019-09-02 ENCOUNTER — Other Ambulatory Visit: Payer: Self-pay

## 2019-09-02 MED ORDER — LOSARTAN POTASSIUM 100 MG PO TABS: 100.0000 mg | ORAL_TABLET | Freq: Every day | ORAL | 1 refills | Status: DC

## 2019-09-07 ENCOUNTER — Other Ambulatory Visit: Payer: Self-pay

## 2019-09-07 MED ORDER — JARDIANCE 10 MG PO TABS: 10.0000 mg | ORAL_TABLET | Freq: Every day | ORAL | 0 refills | Status: DC

## 2019-09-25 ENCOUNTER — Telehealth: Payer: Self-pay | Admitting: Internal Medicine

## 2019-09-25 MED ORDER — METFORMIN HCL 1000 MG PO TABS: ORAL_TABLET | ORAL | 0 refills | Status: DC

## 2019-09-25 NOTE — Telephone Encounter (Signed)
Refill sent. See meds.  

## 2019-09-25 NOTE — Telephone Encounter (Signed)
Patient requesting refill for metformin.  Has upcoming appt.

## 2019-09-28 ENCOUNTER — Ambulatory Visit: Payer: PPO | Admitting: Internal Medicine

## 2019-10-05 ENCOUNTER — Encounter: Payer: Self-pay | Admitting: Internal Medicine

## 2019-11-01 ENCOUNTER — Other Ambulatory Visit: Payer: Self-pay | Admitting: Internal Medicine

## 2019-11-09 ENCOUNTER — Ambulatory Visit: Payer: PPO | Admitting: Internal Medicine

## 2019-11-09 DIAGNOSIS — H524 Presbyopia: Secondary | ICD-10-CM | POA: Diagnosis not present

## 2019-11-09 DIAGNOSIS — H52221 Regular astigmatism, right eye: Secondary | ICD-10-CM | POA: Diagnosis not present

## 2019-11-09 DIAGNOSIS — H00022 Hordeolum internum right lower eyelid: Secondary | ICD-10-CM | POA: Diagnosis not present

## 2019-11-09 DIAGNOSIS — H5213 Myopia, bilateral: Secondary | ICD-10-CM | POA: Diagnosis not present

## 2019-11-23 DIAGNOSIS — Z961 Presence of intraocular lens: Secondary | ICD-10-CM | POA: Diagnosis not present

## 2019-11-23 DIAGNOSIS — H5213 Myopia, bilateral: Secondary | ICD-10-CM | POA: Diagnosis not present

## 2019-11-23 DIAGNOSIS — H59811 Chorioretinal scars after surgery for detachment, right eye: Secondary | ICD-10-CM | POA: Diagnosis not present

## 2019-11-23 DIAGNOSIS — H524 Presbyopia: Secondary | ICD-10-CM | POA: Diagnosis not present

## 2019-11-23 DIAGNOSIS — Z9849 Cataract extraction status, unspecified eye: Secondary | ICD-10-CM | POA: Diagnosis not present

## 2019-11-23 DIAGNOSIS — E119 Type 2 diabetes mellitus without complications: Secondary | ICD-10-CM | POA: Diagnosis not present

## 2019-11-23 DIAGNOSIS — H52221 Regular astigmatism, right eye: Secondary | ICD-10-CM | POA: Diagnosis not present

## 2019-11-23 LAB — HM DIABETES EYE EXAM

## 2019-11-30 ENCOUNTER — Ambulatory Visit: Payer: PPO | Admitting: Internal Medicine

## 2019-12-02 ENCOUNTER — Encounter: Payer: Self-pay | Admitting: Internal Medicine

## 2019-12-04 NOTE — Progress Notes (Signed)
Subjective:    Patient ID: [name], [name], male    DOB: Jun 30, 1941, 79 y.o.   MRN: [id number]  HPI The patient is here for follow up of their chronic medical problems,     Medications and allergies reviewed with patient and updated if appropriate.  Patient Active Problem List   Diagnosis Date Noted  . Hyperkalemia 04/08/2019  . Basal cell carcinoma of face 09/18/2016  . Diabetes ([place]) 02/02/2016  . Oral lichen planus 11/91/4782  . Hx of colonic polyp 09/30/2013  . Essential hypertension 09/13/2010  . Seasonal allergic rhinitis 02/17/2010  . GILBERT'S SYNDROME 07/25/2009  . CORONARY ATHEROSCLEROSIS NATIVE CORONARY ARTERY 06/06/2009  . HYPERLIPIDEMIA 07/28/2007  . 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 aspirin EC 81 MG tablet Take 81 mg by mouth daily.    Marland Kitchen augmented betamethasone dipropionate (DIPROLENE-AF) 0.05 % cream Apply 1 application topically as directed.     . blood glucose meter kit and supplies KIT Dispense based on patient and insurance preference. Use up to four times daily as directed. (FOR E11.9). 1 each 0  . clobetasol (TEMOVATE) 0.05 % GEL Apply to the inside of mouth daily as need.    Marland Kitchen co-enzyme Q-10 30 MG capsule Take 100 mg by mouth as needed (supplement).     . empagliflozin (JARDIANCE) 10 MG TABS tablet Take 10 mg by mouth daily. 90 tablet 0  . famotidine (PEPCID) 20 MG tablet Take 1 tablet (20 mg total) by mouth 2 (two) times daily. 180 tablet 0  . hydrochlorothiazide (HYDRODIURIL) 25 MG tablet Take 1 tablet (25 mg total) by mouth daily. 90 tablet 3  . ibuprofen (ADVIL,MOTRIN) 800 MG tablet Take 1 tablet (800 mg total) by mouth every 8 (eight) hours as needed. 90 tablet 0  . Ibuprofen-Famotidine 800-26.6 MG TABS Take 1 tablet by mouth every 8 hours as needed for pain 90 tablet 1  . losartan (COZAAR) 100 MG tablet Take 1 tablet (100 mg total)  by mouth daily. 90 tablet 1  . metFORMIN (GLUCOPHAGE) 1000 MG tablet TAKE 1 TABLET(1000 MG) BY MOUTH TWICE DAILY WITH A MEAL 180 tablet 0  . metoprolol tartrate (LOPRESSOR) 25 MG tablet TAKE 1 TABLET(25 MG) BY MOUTH TWICE DAILY 180 tablet 1  . Multiple Vitamin (MULTIVITAMIN) tablet Take 1 tablet by mouth daily.      . ONE TOUCH ULTRA TEST test strip TEST UP TO FOUR TIMES DAILY AS DIRECTED 300 each 5  . OneTouch Delica Lancets 95A MISC TEST UP TO FOUR TIMES DAILY AS DIRECTED 100 each 2  . rosuvastatin (CRESTOR) 40 MG tablet TAKE 1 TABLET BY MOUTH DAILY 90 tablet 1  . triamcinolone cream (KENALOG) 0.1 % Apply 1 application topically daily as needed (skin). Reported on 03/26/2016      No current facility-administered medications on file prior to visit.    Past Medical History:  Diagnosis Date  . Coronary artery disease   . Hyperlipidemia   . 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 Hypertension   . Lichen planus    oral  . Other abnormal glucose     Past Surgical History:  Procedure Laterality Date  . APPENDECTOMY    . CATARACT EXTRACTION W/ INTRAOCULAR LENS IMPLANT Right 2009   Dr Bing Plume  . CHOLECYSTECTOMY    . CORONARY ANGIOPLASTY WITH STENT PLACEMENT  2000  .  REFRACTIVE SURGERY  1999   Dr Bing Plume  . TONSILLECTOMY    . VASECTOMY      Social History   Socioeconomic History  . Marital status: Married    Spouse name: Not on file  . Number of children: Not on file  . Years of education: Not on file  . Highest education level: Not on file  Occupational History  . Occupation: retired Engineer, site: RETIRED  Tobacco Use  . Smoking status: Former Smoker    Quit date: 10/15/1997    Years since quitting: 22.1  . Smokeless tobacco: Never Used  . Tobacco comment: smoked 1961-1999, up to 1/2 ppd. Off 3-4 years 1961-1999. Cigar 3/week for 1 year  Substance and Sexual Activity  . Alcohol use: Yes    Alcohol/week: 0.0 standard drinks    Comment:  socially  1-2/ day  . Drug use: No  . Sexual activity: Yes  Other Topics Concern  . Not on file  Social History Narrative  . Not on file   Social Determinants of Health   Financial Resource Strain:   . Difficulty of Paying Living Expenses: Not on file  Food Insecurity:   . Worried About Charity fundraiser in the Last Year: Not on file  . Ran Out of Food in the Last Year: Not on file  Transportation Needs:   . Lack of Transportation (Medical): Not on file  . Lack of Transportation (Non-Medical): Not on file  Physical Activity:   . Days of Exercise per Week: Not on file  . Minutes of Exercise per Session: Not on file  Stress:   . Feeling of Stress : Not on file  Social Connections:   . Frequency of Communication with Friends and Family: Not on file  . Frequency of Social Gatherings with Friends and Family: Not on file  . Attends Religious Services: Not on file  . Active Member of Clubs or Organizations: Not on file  . Attends Archivist Meetings: Not on file  . Marital Status: Not on file    Family History  Problem Relation Age of Onset  . Coronary artery disease Father        MIx3. Initial MI @ 33  . Diabetes Father   . Stroke Father   . COPD Mother   . Diabetes Mother   . Hypertension Brother        DM- CABG  . Kidney failure Sister     Review of Systems     Objective:  There were no vitals filed for this visit. BP Readings from Last 3 Encounters:  04/08/19 (!) 144/70  12/08/18 118/68  09/15/18 (!) 148/82   Wt Readings from Last 3 Encounters:  04/08/19 251 lb 12.8 oz (114.2 kg)  12/08/18 254 lb 1.9 oz (115.3 kg)  09/15/18 257 lb (116.6 kg)   There is no height or weight on file to calculate BMI.   Physical Exam         Assessment & Plan:    See Problem List for Assessment and Plan of chronic medical problems.    This visit occurred during the SARS-CoV-2 public health emergency.  Safety protocols were in place, including screening  questions prior to the visit, additional usage of staff PPE, and extensive cleaning of exam room while observing appropriate contact time as indicated for disinfecting solutions.    This encounter was created in error - please disregard.

## 2019-12-04 NOTE — Patient Instructions (Signed)
  Blood work was ordered.     Medications reviewed and updated.  Changes include :     Your prescription(s) have been submitted to your pharmacy. Please take as directed and contact our office if you believe you are having problem(s) with the medication(s).  A referral was ordered for    Please followup in 6 months   

## 2019-12-07 ENCOUNTER — Encounter: Payer: PPO | Admitting: Internal Medicine

## 2019-12-17 ENCOUNTER — Telehealth: Payer: Self-pay

## 2019-12-17 DIAGNOSIS — E119 Type 2 diabetes mellitus without complications: Secondary | ICD-10-CM

## 2019-12-17 DIAGNOSIS — I10 Essential (primary) hypertension: Secondary | ICD-10-CM

## 2019-12-17 DIAGNOSIS — I2510 Atherosclerotic heart disease of native coronary artery without angina pectoris: Secondary | ICD-10-CM

## 2019-12-17 DIAGNOSIS — E782 Mixed hyperlipidemia: Secondary | ICD-10-CM

## 2019-12-17 NOTE — Telephone Encounter (Signed)
LVM for pt to call back to schedule lab appointment.

## 2019-12-17 NOTE — Telephone Encounter (Signed)
New message  The patient calling asking can labs be put into the system before appt with Dr. Quay Burow on 3.15.21

## 2019-12-17 NOTE — Telephone Encounter (Signed)
Ordered please schedule.

## 2019-12-21 ENCOUNTER — Other Ambulatory Visit (INDEPENDENT_AMBULATORY_CARE_PROVIDER_SITE_OTHER): Payer: PPO

## 2019-12-21 DIAGNOSIS — E782 Mixed hyperlipidemia: Secondary | ICD-10-CM | POA: Diagnosis not present

## 2019-12-21 DIAGNOSIS — I10 Essential (primary) hypertension: Secondary | ICD-10-CM

## 2019-12-21 DIAGNOSIS — E119 Type 2 diabetes mellitus without complications: Secondary | ICD-10-CM | POA: Diagnosis not present

## 2019-12-21 DIAGNOSIS — I2510 Atherosclerotic heart disease of native coronary artery without angina pectoris: Secondary | ICD-10-CM | POA: Diagnosis not present

## 2019-12-21 LAB — LIPID PANEL
Cholesterol: 89 mg/dL (ref 0–200)
HDL: 33 mg/dL — ABNORMAL LOW (ref >39.00)
LDL Cholesterol: 37 mg/dL (ref 0–99)
NonHDL: 56.07
Total CHOL/HDL Ratio: 3
Triglycerides: 97 mg/dL (ref 0.0–149.0)
VLDL: 19.4 mg/dL (ref 0.0–40.0)

## 2019-12-21 LAB — COMPREHENSIVE METABOLIC PANEL
ALT: 21 U/L (ref 0–53)
AST: 18 U/L (ref 0–37)
Albumin: 4.4 g/dL (ref 3.5–5.2)
Alkaline Phosphatase: 45 U/L (ref 39–117)
BUN: 23 mg/dL (ref 6–23)
CO2: 27 mEq/L (ref 19–32)
Calcium: 9.6 mg/dL (ref 8.4–10.5)
Chloride: 103 mEq/L (ref 96–112)
Creatinine, Ser: 1.18 mg/dL (ref 0.40–1.50)
GFR: 59.97 mL/min — ABNORMAL LOW (ref >60.00)
Glucose, Bld: 135 mg/dL — ABNORMAL HIGH (ref 70–99)
Potassium: 4.1 mEq/L (ref 3.5–5.1)
Sodium: 138 mEq/L (ref 135–145)
Total Bilirubin: 1.1 mg/dL (ref 0.2–1.2)
Total Protein: 6.8 g/dL (ref 6.0–8.3)

## 2019-12-21 LAB — HEMOGLOBIN A1C: Hgb A1c MFr Bld: 6.5 % (ref 4.6–6.5)

## 2019-12-24 ENCOUNTER — Encounter: Payer: Self-pay | Admitting: Physician Assistant

## 2019-12-24 ENCOUNTER — Other Ambulatory Visit: Payer: Self-pay

## 2019-12-24 ENCOUNTER — Ambulatory Visit: Payer: PPO | Admitting: Family Medicine

## 2019-12-24 VITALS — BP 150/62 | HR 63 | Ht 72.0 in | Wt 249.4 lb

## 2019-12-24 DIAGNOSIS — I10 Essential (primary) hypertension: Secondary | ICD-10-CM

## 2019-12-24 DIAGNOSIS — E119 Type 2 diabetes mellitus without complications: Secondary | ICD-10-CM

## 2019-12-24 DIAGNOSIS — I2510 Atherosclerotic heart disease of native coronary artery without angina pectoris: Secondary | ICD-10-CM

## 2019-12-24 DIAGNOSIS — E782 Mixed hyperlipidemia: Secondary | ICD-10-CM

## 2019-12-24 NOTE — Progress Notes (Addendum)
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  Last visit with Dr. Burt Knack on June 18, 2018  Patient states he has been doing well since last visit with Dr. Burt Knack.  States he has gained some weight.  And not as active as he used to be.  He was working part-time job as a Pharmacist, community.  States he has since stopped.  He states he has been walking on the treadmill 2-3 times a week without any anginal or exertional symptoms.  He denies any recent acute illnesses, hospitalizations, upcoming surgeries or travels.  No complaints of syncopal or near syncopal episodes, palpitations or arrhythmias, stroke or TIA-like symptoms, bleeding issues, claudication-like issues, DVT or PE-like symptoms, or lower extremity edema.  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  States he is somewhat active and plays golf occasionally.  We reviewed the recent CBC, BMET, and hemoglobin A1c results recently ordered by his primary care provider.   Past Medical History:  Diagnosis Date  . Coronary artery disease   . Hyperlipidemia   . 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 Hypertension   . Lichen planus    oral  . Other abnormal glucose     Past Surgical History:  Procedure Laterality Date  . APPENDECTOMY    . CATARACT EXTRACTION W/ INTRAOCULAR LENS IMPLANT Right 2009   Dr Bing Plume  . CHOLECYSTECTOMY    . CORONARY ANGIOPLASTY WITH STENT PLACEMENT  2000  . REFRACTIVE SURGERY  1999   Dr Bing Plume  . TONSILLECTOMY    . VASECTOMY      Current Outpatient Medications  Medication Sig Dispense Refill  . aspirin EC 81 MG tablet Take 81 mg  by mouth daily.    Marland Kitchen augmented betamethasone dipropionate (DIPROLENE-AF) 0.05 % cream Apply 1 application topically as directed.     . blood glucose meter kit and supplies KIT Dispense based on patient and insurance preference. Use up to four times daily as directed. (FOR E11.9). 1 each 0  . clobetasol (TEMOVATE) 0.05 % GEL Apply to the inside of mouth daily as need.    Marland Kitchen co-enzyme Q-10 30 MG capsule Take 100 mg by mouth as needed (supplement).     . empagliflozin (JARDIANCE) 10 MG TABS tablet Take 10 mg by mouth daily. 90 tablet 0  . famotidine (PEPCID) 20 MG tablet Take 1 tablet (20 mg total) by mouth 2 (two) times daily. 180 tablet 0  . hydrochlorothiazide (HYDRODIURIL) 25 MG tablet Take 1 tablet (25 mg total) by mouth daily. 90 tablet 3  . ibuprofen (ADVIL,MOTRIN) 800 MG tablet Take 1 tablet (800 mg total) by mouth every 8 (eight) hours as needed. 90 tablet 0  . Ibuprofen-Famotidine 800-26.6 MG TABS Take 1 tablet by mouth every 8 hours as needed for pain 90 tablet 1  . losartan (COZAAR) 100 MG tablet Take 1 tablet (100 mg total) by mouth daily. 90 tablet 1  . metFORMIN (GLUCOPHAGE) 1000 MG tablet TAKE 1 TABLET(1000 MG) BY MOUTH TWICE DAILY WITH A MEAL 180 tablet 0  . metoprolol tartrate (LOPRESSOR) 25 MG tablet TAKE  1 TABLET(25 MG) BY MOUTH TWICE DAILY 180 tablet 1  . Multiple Vitamin (MULTIVITAMIN) tablet Take 1 tablet by mouth daily.      . ONE TOUCH ULTRA TEST test strip TEST UP TO FOUR TIMES DAILY AS DIRECTED 300 each 5  . OneTouch Delica Lancets 54G MISC TEST UP TO FOUR TIMES DAILY AS DIRECTED 100 each 2  . rosuvastatin (CRESTOR) 40 MG tablet TAKE 1 TABLET BY MOUTH DAILY 90 tablet 1  . triamcinolone cream (KENALOG) 0.1 % Apply 1 application topically daily as needed (skin). Reported on 03/26/2016      No current facility-administered medications for this visit.   Allergies:  Ramipril   Social History: The patient  reports that he quit smoking about 22 years ago. He has never used  smokeless tobacco. He reports current alcohol use. He reports that he does not use drugs.   Family History: The patient's family history includes COPD in his mother; Coronary artery disease in his father; Diabetes in his father and mother; Hypertension in his brother; Kidney failure in his sister; Stroke in his father.   ROS:  Please see the history of present illness. Otherwise, complete review of systems is positive for none.  All other systems are reviewed and negative.   Physical Exam: VS:  There were no vitals taken for this visit., BMI There is no height or weight on file to calculate BMI.  Wt Readings from Last 3 Encounters:  04/08/19 251 lb 12.8 oz (114.2 kg)  12/08/18 254 lb 1.9 oz (115.3 kg)  09/15/18 257 lb (116.6 kg)    General: Patient appears comfortable at rest. Neck: Supple, no elevated JVP or carotid bruits, no thyromegaly. Lungs: Clear to auscultation, nonlabored breathing at rest. Cardiac: Regular rate and rhythm, no S3 or significant systolic murmur, no pericardial rub. Extremities: No pitting edema, distal pulses 2+. Skin: Warm and dry. Musculoskeletal: No kyphosis. Neuropsychiatric: Alert and oriented x3, affect grossly appropriate.  ECG:  An ECG dated 12/24/2019 was personally reviewed today and demonstrated:  Normal sinus rhythm first-degree AV block, LAD, incomplete RBBB, no acute ST or T wave changes.  No evidence of LVH.  Recent Labwork: 04/03/2019: Hemoglobin 17.9; Platelets 183.0; TSH 2.88 12/21/2019: ALT 21; AST 18; BUN 23; Creatinine, Ser 1.18; Potassium 4.1; Sodium 138     Component Value Date/Time   CHOL 89 12/21/2019 0745   CHOL 94 (L) 04/26/2017 1008   TRIG 97.0 12/21/2019 0745   HDL 33.00 (L) 12/21/2019 0745   HDL 32 (L) 04/26/2017 1008   CHOLHDL 3 12/21/2019 0745   VLDL 19.4 12/21/2019 0745   LDLCALC 37 12/21/2019 0745   LDLCALC 40 04/26/2017 1008    Other Studies Reviewed Today:   Assessment and Plan:  1. Coronary artery disease  involving native coronary artery of native heart without angina pectoris   2. Essential hypertension   3. Mixed hyperlipidemia   4. Type 2 diabetes mellitus without complication, without long-term current use of insulin ([place])    1. 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 Cath 2001 angioplasty of ISR and Mod LAD stenosis. Medical therapy.  Patient denies any progressive anginal or exertional symptoms.  States he walks on the treadmill with no anginal or exertional symptoms.  Continue aspirin 81 mg daily, metoprolol 25 mg p.o. twice daily.  2. Essential hypertension Blood pressure slightly elevated on arrival today 150/62.  Patient states he checked his blood pressure every morning  and it usually runs in the 097D systolic.  Continue losartan 100 mg daily  3. Mixed hyperlipidemia Recent lipid levels on December 21, 2019; TC 89, TRI G 97, HDL 33, LDL 37.  Continue Crestor 40 mg daily.  4. DM2 12/21/2019 hemoglobin A1c 6.5% managed by PCP.   Medication Adjustments/Labs and Tests Ordered: Current medicines are reviewed at length with the patient today.  Concerns regarding medicines are outlined above.  Recent lab work ordered by primary care physician reviewed with with patient.  Printed out copies of recent lab work and given to patient.  Disposition: Follow-up with Dr Burt Knack or APP  Signed, Levell July, NP 12/24/2019 2:44 PM    [facility]

## 2019-12-24 NOTE — Patient Instructions (Addendum)
Medication Instructions:  Your physician recommends that you continue on your current medications as directed. Please refer to the Current Medication list given to you today.  *If you need a refill on your cardiac medications before your next appointment, please call your pharmacy*   Lab Work: None ordered  If you have labs (blood work) drawn today and your tests are completely normal, you will receive your results only by: Marland Kitchen MyChart Message (if you have MyChart) OR . A paper copy in the mail If you have any lab test that is abnormal or we need to change your treatment, we will call you to review the results.   Testing/Procedures: None ordered   Follow-Up: At [facility], you and your health needs are our priority.  As part of our continuing mission to provide you with exceptional heart care, we have created designated Provider Care Teams.  These Care Teams include your primary Cardiologist (physician) and Advanced Practice Providers (APPs -  Physician Assistants and Nurse Practitioners) who all work together to provide you with the care you need, when you need it.  We recommend signing up for the patient portal called "MyChart".  Sign up information is provided on this After Visit Summary.  MyChart is used to connect with patients for Virtual Visits (Telemedicine).  Patients are able to view lab/test results, encounter notes, upcoming appointments, etc.  Non-urgent messages can be sent to your provider as well.   To learn more about what you can do with MyChart, go to [email or web address].    Your next appointment:   12 month(s)  The format for your next appointment:   In Person  Provider:   You may see [name], MD or one of the following Advanced Practice Providers on your designated Care Team:    Richardson Dopp, PA-C  [place], [place]  Daune Perch, NP     Other Instructions

## 2019-12-27 NOTE — Patient Instructions (Addendum)
  Blood work was reviewed.     Medications reviewed and updated.  Changes include :   none  Your prescription(s) have been submitted to your pharmacy. Please take as directed and contact our office if you believe you are having problem(s) with the medication(s).    Please followup in 6 months

## 2019-12-27 NOTE — Progress Notes (Signed)
Subjective:    Patient ID: [name], [name], male    DOB: [birth year], 79 y.o.   MRN: [id number]  HPI The patient is here for follow up of their chronic medical problems, including  CAD, hypertension, diabetes, hyperlipidemia  He is taking all of his medications as prescribed.     He is exercising regularly - treadmill 3/week, weights 1-2 days a week.       Medications and allergies reviewed with patient and updated if appropriate.  Patient Active Problem List   Diagnosis Date Noted  . Basal cell carcinoma of face 09/18/2016  . Diabetes ([place]) 02/02/2016  . Oral lichen planus 16/10/930  . Hx of colonic polyp 09/30/2013  . Essential hypertension 09/13/2010  . Seasonal allergic rhinitis 02/17/2010  . GILBERT'S SYNDROME 07/25/2009  . CORONARY ATHEROSCLEROSIS NATIVE CORONARY ARTERY 06/06/2009  . HYPERLIPIDEMIA 07/28/2007  . 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 aspirin EC 81 MG tablet Take 81 mg by mouth daily.    Marland Kitchen augmented betamethasone dipropionate (DIPROLENE-AF) 0.05 % cream Apply 1 application topically as needed (iritation).     . blood glucose meter kit and supplies KIT Dispense based on patient and insurance preference. Use up to four times daily as directed. (FOR E11.9). 1 each 0  . co-enzyme Q-10 30 MG capsule Take 100 mg by mouth as needed (supplement).     . empagliflozin (JARDIANCE) 10 MG TABS tablet Take 10 mg by mouth daily. 90 tablet 0  . hydrochlorothiazide (HYDRODIURIL) 25 MG tablet Take 1 tablet (25 mg total) by mouth daily. 90 tablet 3  . ibuprofen (ADVIL,MOTRIN) 800 MG tablet Take 1 tablet (800 mg total) by mouth every 8 (eight) hours as needed. 90 tablet 0  . losartan (COZAAR) 100 MG tablet Take 1 tablet (100 mg total) by mouth daily. 90 tablet 1  . metoprolol tartrate (LOPRESSOR) 25 MG tablet TAKE 1 TABLET(25 MG) BY MOUTH TWICE DAILY 180 tablet 1   . Multiple Vitamin (MULTIVITAMIN) tablet Take 1 tablet by mouth daily.      . ONE TOUCH ULTRA TEST test strip TEST UP TO FOUR TIMES DAILY AS DIRECTED 300 each 5  . OneTouch Delica Lancets 35T MISC TEST UP TO FOUR TIMES DAILY AS DIRECTED 100 each 2  . rosuvastatin (CRESTOR) 40 MG tablet TAKE 1 TABLET BY MOUTH DAILY 90 tablet 1   No current facility-administered medications on file prior to visit.    Past Medical History:  Diagnosis Date  . Coronary artery disease   . Hyperlipidemia   . 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 Hypertension   . Lichen planus    oral  . Other abnormal glucose     Past Surgical History:  Procedure Laterality Date  . APPENDECTOMY    . CATARACT EXTRACTION W/ INTRAOCULAR LENS IMPLANT Right 2009   Dr Bing Plume  . CHOLECYSTECTOMY    . CORONARY ANGIOPLASTY WITH STENT PLACEMENT  2000  . REFRACTIVE SURGERY  1999   Dr Bing Plume  . TONSILLECTOMY    . VASECTOMY      Social History   Socioeconomic History  . Marital status: Married    Spouse name: Not on file  . Number of children: Not on file  . Years of education: Not on file  . Highest education level: Not on file  Occupational History  . Occupation: retired  dentist    Employer: RETIRED  Tobacco Use  . Smoking status: Former Smoker    Quit date: 10/15/1997    Years since quitting: 22.2  . Smokeless tobacco: Never Used  . Tobacco comment: smoked 1961-1999, up to 1/2 ppd. Off 3-4 years 1961-1999. Cigar 3/week for 1 year  Substance and Sexual Activity  . Alcohol use: Yes    Alcohol/week: 0.0 standard drinks    Comment: socially  1-2/ day  . Drug use: No  . Sexual activity: Yes  Other Topics Concern  . Not on file  Social History Narrative  . Not on file   Social Determinants of Health   Financial Resource Strain:   . Difficulty of Paying Living Expenses:   Food Insecurity:   . Worried About Charity fundraiser in the Last Year:   . Arboriculturist in the Last  Year:   Transportation Needs:   . Film/video editor (Medical):   Marland Kitchen Lack of Transportation (Non-Medical):   Physical Activity:   . Days of Exercise per Week:   . Minutes of Exercise per Session:   Stress:   . Feeling of Stress :   Social Connections:   . Frequency of Communication with Friends and Family:   . Frequency of Social Gatherings with Friends and Family:   . Attends Religious Services:   . Active Member of Clubs or Organizations:   . Attends Archivist Meetings:   Marland Kitchen Marital Status:     Family History  Problem Relation Age of Onset  . Coronary artery disease Father        MIx3. Initial MI @ 39  . Diabetes Father   . Stroke Father   . COPD Mother   . Diabetes Mother   . Hypertension Brother        DM- CABG  . Kidney failure Sister     Review of Systems  Constitutional: Negative for chills and fever.  Respiratory: Negative for cough, shortness of breath and wheezing.   Cardiovascular: Negative for chest pain, palpitations and leg swelling.  Neurological: Negative for light-headedness and headaches.       Objective:   Vitals:   12/28/19 0924  BP: 134/80  Pulse: (!) 57  Resp: 16  Temp: 98 F (36.7 C)  SpO2: 99%   BP Readings from Last 3 Encounters:  12/28/19 134/80  12/24/19 (!) 150/62  04/08/19 (!) 144/70   Wt Readings from Last 3 Encounters:  12/28/19 248 lb (112.5 kg)  12/24/19 249 lb 6.4 oz (113.1 kg)  04/08/19 251 lb 12.8 oz (114.2 kg)   Body mass index is 33.63 kg/m.   Physical Exam    Constitutional: Appears well-developed and well-nourished. No distress.  HENT:  Head: Normocephalic and atraumatic.  Neck: Neck supple. No tracheal deviation present. No thyromegaly present.  No cervical lymphadenopathy Cardiovascular: Normal rate, regular rhythm and normal heart sounds.   No murmur heard. No carotid bruit .  No edema Pulmonary/Chest: Effort normal and breath sounds normal. No respiratory distress. No has no wheezes. No  rales.  Skin: Skin is warm and dry. Not diaphoretic.  Psychiatric: Normal mood and affect. Behavior is normal.      Assessment & Plan:    See Problem List for Assessment and Plan of chronic medical problems.    This visit occurred during the SARS-CoV-2 public health emergency.  Safety protocols were in place, including screening questions prior to the visit, additional usage of staff PPE, and  extensive cleaning of exam room while observing appropriate contact time as indicated for disinfecting solutions.

## 2019-12-28 ENCOUNTER — Encounter: Payer: Self-pay | Admitting: Internal Medicine

## 2019-12-28 ENCOUNTER — Other Ambulatory Visit: Payer: Self-pay

## 2019-12-28 ENCOUNTER — Ambulatory Visit (INDEPENDENT_AMBULATORY_CARE_PROVIDER_SITE_OTHER): Payer: PPO | Admitting: Internal Medicine

## 2019-12-28 VITALS — BP 134/80 | HR 57 | Temp 98.0°F | Resp 16 | Ht 72.0 in | Wt 248.0 lb

## 2019-12-28 DIAGNOSIS — I2510 Atherosclerotic heart disease of native coronary artery without angina pectoris: Secondary | ICD-10-CM | POA: Diagnosis not present

## 2019-12-28 DIAGNOSIS — E119 Type 2 diabetes mellitus without complications: Secondary | ICD-10-CM

## 2019-12-28 DIAGNOSIS — I10 Essential (primary) hypertension: Secondary | ICD-10-CM

## 2019-12-28 DIAGNOSIS — E782 Mixed hyperlipidemia: Secondary | ICD-10-CM

## 2019-12-28 MED ORDER — METFORMIN HCL 1000 MG PO TABS: ORAL_TABLET | ORAL | 1 refills | Status: DC

## 2019-12-28 NOTE — Assessment & Plan Note (Addendum)
Chronic BP well controlled Current regimen effective and well tolerated Continue current medications at current doses cmp reviewed  

## 2019-12-28 NOTE — Assessment & Plan Note (Signed)
Chronic following with cardiology - visits up to date No concerning symptoms of angina Continue current meds

## 2019-12-28 NOTE — Assessment & Plan Note (Addendum)
Chronic Lipid panel controlled Continue daily statin Regular exercise and healthy diet encouraged

## 2019-12-28 NOTE — Assessment & Plan Note (Addendum)
Chronic On jardiance, metformin-continue A1c 6.5% Low sugar / carb diet Continue regular exercise

## 2019-12-30 DIAGNOSIS — R351 Nocturia: Secondary | ICD-10-CM | POA: Diagnosis not present

## 2019-12-30 DIAGNOSIS — N401 Enlarged prostate with lower urinary tract symptoms: Secondary | ICD-10-CM | POA: Diagnosis not present

## 2020-01-16 ENCOUNTER — Other Ambulatory Visit: Payer: Self-pay | Admitting: Family

## 2020-02-01 ENCOUNTER — Other Ambulatory Visit: Payer: Self-pay | Admitting: Cardiovascular Disease

## 2020-03-07 ENCOUNTER — Telehealth: Payer: Self-pay | Admitting: Internal Medicine

## 2020-03-07 MED ORDER — LOSARTAN POTASSIUM 100 MG PO TABS: 100.0000 mg | ORAL_TABLET | Freq: Every day | ORAL | 1 refills | Status: DC

## 2020-03-07 NOTE — Telephone Encounter (Signed)
Rx sent. LVM letting pt know.  

## 2020-03-07 NOTE — Telephone Encounter (Signed)
New message:    Pt states he would like a call in reference to this medication. He states [facility] has sent a refill req 5 times. Please advise.  1.Medication Requested: losartan (COZAAR) 100 MG tablet 2. Pharmacy (Name, [address] [place]): [place] [address] [place] 3. On Med List: Yes  4. Last Visit with PCP: 12/28/19  5. Next visit date with PCP: 06/27/20   Agent: Please be advised that RX refills may take up to 3 business days. We ask that you follow-up with your pharmacy.

## 2020-03-22 ENCOUNTER — Other Ambulatory Visit: Payer: Self-pay | Admitting: Internal Medicine

## 2020-06-26 NOTE — Progress Notes (Signed)
  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   MRN: [id number]  HPI The patient is here for follow up of their chronic medical problems, including CAD, htn, DM, hyperlipidemia  He is taking all of his medications as prescribed.     He is exercising regularly.   He does the treadmill but less due to knee issues.   He plays golf and does yard work.    No longer taking jardiance due to expense.  Sugars still well controlled.   Overall he feels he is doing well.    Medications and allergies reviewed with patient and updated if appropriate.  Patient Active Problem List   Diagnosis Date Noted  . Basal cell carcinoma of face 09/18/2016  . Diabetes ([facility]) 02/02/2016  . Oral lichen planus 11/14/2015  . Hx of colonic polyp 09/30/2013  . Essential hypertension 09/13/2010  . Seasonal allergic rhinitis 02/17/2010  . GILBERT'S SYNDROME 07/25/2009  . CORONARY ATHEROSCLEROSIS NATIVE CORONARY ARTERY 06/06/2009  . HYPERLIPIDEMIA 07/28/2007  . BPH (benign prostatic hyperplasia) 07/28/2007    Current Outpatient Medications on File Prior to Visit  Medication Sig Dispense Refill  . aspirin EC 81 MG tablet Take 81 mg by mouth daily.    . augmented betamethasone dipropionate (DIPROLENE-AF) 0.05 % cream Apply 1 application topically as needed (iritation).     . blood glucose meter kit and supplies KIT Dispense based on patient and insurance preference. Use up to four times daily as directed. (FOR E11.9). 1 each 0  . co-enzyme Q-10 30 MG capsule Take 100 mg by mouth as needed (supplement).     . hydrochlorothiazide (HYDRODIURIL) 25 MG tablet Take 1 tablet (25 mg total) by mouth daily. 90 tablet 3  . ibuprofen (ADVIL) 200 MG tablet Take 200 mg by mouth every 6 (six) hours as needed.    . losartan (COZAAR) 100 MG tablet Take 1 tablet (100 mg total) by mouth daily. 90 tablet 1  . metFORMIN (GLUCOPHAGE) 1000 MG tablet TAKE 1 TABLET(1000 MG) BY MOUTH TWICE DAILY  WITH A MEAL 180 tablet 1  . metoprolol tartrate (LOPRESSOR) 25 MG tablet TAKE 1 TABLET(25 MG) BY MOUTH TWICE DAILY 180 tablet 1  . Multiple Vitamin (MULTIVITAMIN) tablet Take 1 tablet by mouth daily.      . MYRBETRIQ 25 MG TB24 tablet Take 25 mg by mouth daily.    . ONE TOUCH ULTRA TEST test strip TEST UP TO FOUR TIMES DAILY AS DIRECTED 300 each 5  . OneTouch Delica Lancets 33G MISC TEST UP TO FOUR TIMES DAILY AS DIRECTED 100 each 2  . rosuvastatin (CRESTOR) 40 MG tablet TAKE 1 TABLET BY MOUTH DAILY 90 tablet 2   No current facility-administered medications on file prior to visit.    Past Medical History:  Diagnosis Date  . Coronary artery disease   . Hyperlipidemia   . 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 Hypertension   . Lichen planus    oral  . Other abnormal glucose     Past Surgical History:  Procedure Laterality Date  . APPENDECTOMY    . CATARACT EXTRACTION W/ INTRAOCULAR LENS IMPLANT Right 2009   Dr Digby  . CHOLECYSTECTOMY    . CORONARY ANGIOPLASTY WITH STENT PLACEMENT  2000  . REFRACTIVE SURGERY  1999   Dr Digby  . TONSILLECTOMY    . VASECTOMY      Social History     Socioeconomic History  . Marital status: Married    Spouse name: Not on file  . Number of children: Not on file  . Years of education: Not on file  . Highest education level: Not on file  Occupational History  . Occupation: retired Engineer, site: RETIRED  Tobacco Use  . Smoking status: Former Smoker    Quit date: 10/15/1997    Years since quitting: 22.7  . Smokeless tobacco: Never Used  . Tobacco comment: smoked 1961-1999, up to 1/2 ppd. Off 3-4 years 1961-1999. Cigar 3/week for 1 year  Substance and Sexual Activity  . Alcohol use: Yes    Alcohol/week: 0.0 standard drinks    Comment: socially  1-2/ day  . Drug use: No  . Sexual activity: Yes  Other Topics Concern  . Not on file  Social History Narrative  . Not on file   Social Determinants of  Health   Financial Resource Strain:   . Difficulty of Paying Living Expenses: Not on file  Food Insecurity:   . Worried About Charity fundraiser in the Last Year: Not on file  . Ran Out of Food in the Last Year: Not on file  Transportation Needs:   . Lack of Transportation (Medical): Not on file  . Lack of Transportation (Non-Medical): Not on file  Physical Activity:   . Days of Exercise per Week: Not on file  . Minutes of Exercise per Session: Not on file  Stress:   . Feeling of Stress : Not on file  Social Connections:   . Frequency of Communication with Friends and Family: Not on file  . Frequency of Social Gatherings with Friends and Family: Not on file  . Attends Religious Services: Not on file  . Active Member of Clubs or Organizations: Not on file  . Attends Archivist Meetings: Not on file  . Marital Status: Not on file    Family History  Problem Relation Age of Onset  . Coronary artery disease Father        MIx3. Initial MI @ 72  . Diabetes Father   . Stroke Father   . COPD Mother   . Diabetes Mother   . Hypertension Brother        DM- CABG  . Kidney failure Sister     Review of Systems  Constitutional: Negative for chills and fever.  Respiratory: Negative for cough, shortness of breath and wheezing.   Cardiovascular: Negative for chest pain, palpitations and leg swelling.  Neurological: Negative for light-headedness and headaches.       Objective:   Vitals:   06/27/20 1012  BP: 132/82  Pulse: 71  Temp: 98.1 F (36.7 C)  SpO2: 95%   BP Readings from Last 3 Encounters:  06/27/20 132/82  12/28/19 134/80  12/24/19 (!) 150/62   Wt Readings from Last 3 Encounters:  06/27/20 248 lb 6.4 oz (112.7 kg)  12/28/19 248 lb (112.5 kg)  12/24/19 249 lb 6.4 oz (113.1 kg)   Body mass index is 33.69 kg/m.   Physical Exam    Constitutional: Appears well-developed and well-nourished. No distress.  HENT:  Head: Normocephalic and atraumatic.    Neck: Neck supple. No tracheal deviation present. No thyromegaly present.  No cervical lymphadenopathy Cardiovascular: Normal rate, regular rhythm and normal heart sounds.   No murmur heard. No carotid bruit .  No edema Pulmonary/Chest: Effort normal and breath sounds normal. No respiratory distress. No has no wheezes. No rales.  Skin: Skin is warm and dry. Not diaphoretic.  Psychiatric: Normal mood and affect. Behavior is normal.      Assessment & Plan:    See Problem List for Assessment and Plan of chronic medical problems.    This visit occurred during the SARS-CoV-2 public health emergency.  Safety protocols were in place, including screening questions prior to the visit, additional usage of staff PPE, and extensive cleaning of exam room while observing appropriate contact time as indicated for disinfecting solutions.

## 2020-06-26 NOTE — Patient Instructions (Addendum)
  Blood work was ordered.     Medications reviewed and updated.  Changes include :   none  Your prescription(s) have been submitted to your pharmacy. Please take as directed and contact our office if you believe you are having problem(s) with the medication(s).   Please followup in 6 months   

## 2020-06-27 ENCOUNTER — Ambulatory Visit (INDEPENDENT_AMBULATORY_CARE_PROVIDER_SITE_OTHER): Payer: PPO | Admitting: Internal Medicine

## 2020-06-27 ENCOUNTER — Other Ambulatory Visit: Payer: Self-pay

## 2020-06-27 ENCOUNTER — Encounter: Payer: Self-pay | Admitting: Internal Medicine

## 2020-06-27 VITALS — BP 132/82 | HR 71 | Temp 98.1°F | Wt 248.4 lb

## 2020-06-27 DIAGNOSIS — E119 Type 2 diabetes mellitus without complications: Secondary | ICD-10-CM | POA: Diagnosis not present

## 2020-06-27 DIAGNOSIS — N401 Enlarged prostate with lower urinary tract symptoms: Secondary | ICD-10-CM | POA: Diagnosis not present

## 2020-06-27 DIAGNOSIS — E782 Mixed hyperlipidemia: Secondary | ICD-10-CM | POA: Diagnosis not present

## 2020-06-27 DIAGNOSIS — Z125 Encounter for screening for malignant neoplasm of prostate: Secondary | ICD-10-CM | POA: Diagnosis not present

## 2020-06-27 DIAGNOSIS — I2510 Atherosclerotic heart disease of native coronary artery without angina pectoris: Secondary | ICD-10-CM

## 2020-06-27 DIAGNOSIS — R350 Frequency of micturition: Secondary | ICD-10-CM

## 2020-06-27 DIAGNOSIS — I10 Essential (primary) hypertension: Secondary | ICD-10-CM

## 2020-06-27 DIAGNOSIS — Z1159 Encounter for screening for other viral diseases: Secondary | ICD-10-CM

## 2020-06-27 MED ORDER — EMPAGLIFLOZIN 10 MG PO TABS
10.0000 mg | ORAL_TABLET | Freq: Every day | ORAL | 5 refills | Status: DC
Start: 2020-06-27 — End: 2021-04-03

## 2020-06-27 NOTE — Assessment & Plan Note (Addendum)
Chronic No longer taking jardiance due to cost, but would consider restarting.  Discussed heart benefits and he will restart this-sent to pharmacy Sugars sound well controlled at home Check a1c Low sugar / carb diet Stressed regular exercise

## 2020-06-27 NOTE — Assessment & Plan Note (Addendum)
Chronic Sees urology  Started on [place]

## 2020-06-27 NOTE — Assessment & Plan Note (Signed)
Chronic Check lipid panel  Continue daily statin Regular exercise and healthy diet encouraged  

## 2020-06-27 NOTE — Assessment & Plan Note (Signed)
Chronic No angina symptoms Continue current medications

## 2020-06-27 NOTE — Assessment & Plan Note (Signed)
Chronic BP well controlled Current regimen effective and well tolerated Continue current medications at current doses cmp  

## 2020-06-28 LAB — LIPID PANEL
Cholesterol: 112 mg/dL (ref <200)
HDL: 37 mg/dL — ABNORMAL LOW (ref >=40)
LDL Cholesterol (Calc): 54 mg/dL (calc)
Non-HDL Cholesterol (Calc): 75 mg/dL (calc) (ref <130)
Total CHOL/HDL Ratio: 3 (calc) (ref <5.0)
Triglycerides: 126 mg/dL (ref <150)

## 2020-06-28 LAB — CBC WITH DIFFERENTIAL/PLATELET
Absolute Monocytes: 695 cells/uL (ref 200–950)
Basophils Absolute: 47 cells/uL (ref 0–200)
Basophils Relative: 0.6 %
Eosinophils Absolute: 284 cells/uL (ref 15–500)
Eosinophils Relative: 3.6 %
HCT: 46.5 % (ref 38.5–50.0)
Hemoglobin: 16 g/dL (ref 13.2–17.1)
Lymphs Abs: 1375 cells/uL (ref 850–3900)
MCH: 32.4 pg (ref 27.0–33.0)
MCHC: 34.4 g/dL (ref 32.0–36.0)
MCV: 94.1 fL (ref 80.0–100.0)
MPV: 9.8 fL (ref 7.5–12.5)
Monocytes Relative: 8.8 %
Neutro Abs: 5498 cells/uL (ref 1500–7800)
Neutrophils Relative %: 69.6 %
Platelets: 169 10*3/uL (ref 140–400)
RBC: 4.94 10*6/uL (ref 4.20–5.80)
RDW: 12.5 % (ref 11.0–15.0)
Total Lymphocyte: 17.4 %
WBC: 7.9 10*3/uL (ref 3.8–10.8)

## 2020-06-28 LAB — HEMOGLOBIN A1C
Hgb A1c MFr Bld: 7.1 % of total Hgb — ABNORMAL HIGH (ref <5.7)
Mean Plasma Glucose: 157 (calc)
eAG (mmol/L): 8.7 (calc)

## 2020-06-28 LAB — COMPLETE METABOLIC PANEL WITH GFR
AG Ratio: 2.2 (calc) (ref 1.0–2.5)
ALT: 18 U/L (ref 9–46)
AST: 16 U/L (ref 10–35)
Albumin: 4.3 g/dL (ref 3.6–5.1)
Alkaline phosphatase (APISO): 46 U/L (ref 35–144)
BUN: 21 mg/dL (ref 7–25)
CO2: 27 mmol/L (ref 20–32)
Calcium: 9.7 mg/dL (ref 8.6–10.3)
Chloride: 104 mmol/L (ref 98–110)
Creat: 1.01 mg/dL (ref 0.70–1.18)
GFR, Est African American: 83 mL/min/{1.73_m2} (ref >=60)
GFR, Est Non African American: 72 mL/min/{1.73_m2} (ref >=60)
Globulin: 2 g/dL (calc) (ref 1.9–3.7)
Glucose, Bld: 130 mg/dL — ABNORMAL HIGH (ref 65–99)
Potassium: 4.7 mmol/L (ref 3.5–5.3)
Sodium: 140 mmol/L (ref 135–146)
Total Bilirubin: 1 mg/dL (ref 0.2–1.2)
Total Protein: 6.3 g/dL (ref 6.1–8.1)

## 2020-06-28 LAB — HEPATITIS C ANTIBODY
Hepatitis C Ab: NONREACTIVE
SIGNAL TO CUT-OFF: 0.01 (ref <1.00)

## 2020-06-28 LAB — PSA: PSA: 0.75 ng/mL (ref <=4.0)

## 2020-06-29 ENCOUNTER — Other Ambulatory Visit: Payer: Self-pay | Admitting: Internal Medicine

## 2020-07-12 ENCOUNTER — Telehealth: Payer: Self-pay | Admitting: Internal Medicine

## 2020-07-12 NOTE — Progress Notes (Signed)
  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   An unsuccessful telephone outreach was attempted today. The patient was referred to the pharmacist for assistance with care management and care coordination.   Follow Up Plan:   Carley Perdue [employer] Scheduler

## 2020-08-08 ENCOUNTER — Other Ambulatory Visit: Payer: Self-pay | Admitting: Internal Medicine

## 2020-08-22 DIAGNOSIS — L570 Actinic keratosis: Secondary | ICD-10-CM | POA: Diagnosis not present

## 2020-08-22 DIAGNOSIS — L821 Other seborrheic keratosis: Secondary | ICD-10-CM | POA: Diagnosis not present

## 2020-08-22 DIAGNOSIS — D2261 Melanocytic nevi of right upper limb, including shoulder: Secondary | ICD-10-CM | POA: Diagnosis not present

## 2020-08-22 DIAGNOSIS — Z85828 Personal history of other malignant neoplasm of skin: Secondary | ICD-10-CM | POA: Diagnosis not present

## 2020-09-12 ENCOUNTER — Other Ambulatory Visit: Payer: Self-pay

## 2020-09-12 ENCOUNTER — Telehealth: Payer: Self-pay | Admitting: Internal Medicine

## 2020-09-12 MED ORDER — LOSARTAN POTASSIUM 100 MG PO TABS: 100.0000 mg | ORAL_TABLET | Freq: Every day | ORAL | 1 refills | Status: DC

## 2020-09-12 MED ORDER — IBUPROFEN 800 MG PO TABS: 800.0000 mg | ORAL_TABLET | Freq: Every day | ORAL | 0 refills | Status: DC | PRN

## 2020-09-12 NOTE — Telephone Encounter (Signed)
losartan (COZAAR) 100 MG tablet Ibuprofen 800 mg 90 tablets  Patient requesting a refill for both medications.  [id number] [address], [place] Phone:  [phone]  Fax:  [phone]    Last seen- 09.13.21 Next apt- 03.14.22

## 2020-09-14 ENCOUNTER — Encounter: Payer: Self-pay | Admitting: Internal Medicine

## 2020-09-14 ENCOUNTER — Other Ambulatory Visit: Payer: Self-pay

## 2020-09-14 ENCOUNTER — Telehealth (INDEPENDENT_AMBULATORY_CARE_PROVIDER_SITE_OTHER): Payer: PPO | Admitting: Internal Medicine

## 2020-09-14 DIAGNOSIS — J209 Acute bronchitis, unspecified: Secondary | ICD-10-CM

## 2020-09-14 MED ORDER — AZITHROMYCIN 250 MG PO TABS: ORAL_TABLET | ORAL | 0 refills | Status: DC

## 2020-09-14 MED ORDER — HYDROCODONE-HOMATROPINE 5-1.5 MG/5ML PO SYRP
5.0000 mL | ORAL_SOLUTION | Freq: Three times a day (TID) | ORAL | 0 refills | Status: DC | PRN
Start: 2020-09-14 — End: 2020-12-25

## 2020-09-14 NOTE — Progress Notes (Signed)
Virtual Visit via Video Note  I connected with [name], [name] on 09/14/20 at 10:15 AM EST by a video enabled telemedicine application and verified that I am speaking with the correct person using two identifiers.   I discussed the limitations of evaluation and management by telemedicine and the availability of in person appointments. The patient expressed understanding and agreed to proceed.  Present for the visit:  Myself, Dr Billey Gosling, Allegra Lai.  The patient is currently at home and I am in the office.    No referring provider.    History of Present Illness: He is here for an acute visit for cold symptoms.  His symptoms started several days ago.  He is experiencing fevers, chills, nasal congestion and chest congestion, sore throat, productive cough of discolored sputum, clogged ears and headaches.  His symptoms have gotten worse in the past 24 hours.  He has tried taking over-the-counter cold medication without much relief.  He is fully vaccinated.  He denies any exposure to Covid.  His wife is asymptomatic.   Review of Systems  Constitutional: Positive for chills and fever.  HENT: Positive for congestion and sore throat. Negative for ear pain (ears clogged).   Respiratory: Positive for cough and sputum production (discolored). Negative for shortness of breath and wheezing.   Neurological: Positive for headaches. Negative for dizziness.      Social History   Socioeconomic History  . Marital status: Married    Spouse name: Not on file  . Number of children: Not on file  . Years of education: Not on file  . Highest education level: Not on file  Occupational History  . Occupation: retired Engineer, site: RETIRED  Tobacco Use  . Smoking status: Former Smoker    Quit date: 10/15/1997    Years since quitting: 22.9  . Smokeless tobacco: Never Used  . Tobacco comment: smoked 1961-1999, up to 1/2 ppd. Off 3-4 years 1961-1999. Cigar 3/week for 1 year  Substance and  Sexual Activity  . Alcohol use: Yes    Alcohol/week: 0.0 standard drinks    Comment: socially  1-2/ day  . Drug use: No  . Sexual activity: Yes  Other Topics Concern  . Not on file  Social History Narrative  . Not on file   Social Determinants of Health   Financial Resource Strain:   . Difficulty of Paying Living Expenses: Not on file  Food Insecurity:   . Worried About Charity fundraiser in the Last Year: Not on file  . Ran Out of Food in the Last Year: Not on file  Transportation Needs:   . Lack of Transportation (Medical): Not on file  . Lack of Transportation (Non-Medical): Not on file  Physical Activity:   . Days of Exercise per Week: Not on file  . Minutes of Exercise per Session: Not on file  Stress:   . Feeling of Stress : Not on file  Social Connections:   . Frequency of Communication with Friends and Family: Not on file  . Frequency of Social Gatherings with Friends and Family: Not on file  . Attends Religious Services: Not on file  . Active Member of Clubs or Organizations: Not on file  . Attends Archivist Meetings: Not on file  . Marital Status: Not on file     Observations/Objective: Appears well in NAD Breathing normally, intermittent cough Skin appears warm and dry  Assessment and Plan:  See Problem List for Assessment  and Plan of chronic medical problems.   Follow Up Instructions:    I discussed the assessment and treatment plan with the patient. The patient was provided an opportunity to ask questions and all were answered. The patient agreed with the plan and demonstrated an understanding of the instructions.   The patient was advised to call back or seek an in-person evaluation if the symptoms worsen or if the condition fails to improve as anticipated.    [name], MD

## 2020-09-14 NOTE — Assessment & Plan Note (Addendum)
Acute Likely bacterial  Start a Z-Pak which has been effective for him in the past Hycodan cough syrup 5 mL every 8 hours as needed otc cold medications Rest, fluid If his symptoms do not improve over the next day or so advised him to get tested for Covid Call if no improvement

## 2020-11-23 DIAGNOSIS — L280 Lichen simplex chronicus: Secondary | ICD-10-CM | POA: Diagnosis not present

## 2020-11-23 DIAGNOSIS — Z85828 Personal history of other malignant neoplasm of skin: Secondary | ICD-10-CM | POA: Diagnosis not present

## 2020-11-23 DIAGNOSIS — L821 Other seborrheic keratosis: Secondary | ICD-10-CM | POA: Diagnosis not present

## 2020-11-23 DIAGNOSIS — L570 Actinic keratosis: Secondary | ICD-10-CM | POA: Diagnosis not present

## 2020-12-25 ENCOUNTER — Other Ambulatory Visit: Payer: Self-pay | Admitting: Internal Medicine

## 2020-12-25 NOTE — Progress Notes (Signed)
Subjective:    Patient ID: [name], [name], male    DOB: [birth year], 80 y.o.   MRN: [id number]  HPI The patient is here for follow up of their chronic medical problems, including CAD, htn, DM, hyperlipidemia   Once every two week he has intermittent runny nose.  He has heard about ipratropium and would like to try it.   He is fairly compliant with a low carb diet.  He is exercising.      Medications and allergies reviewed with patient and updated if appropriate.  Patient Active Problem List   Diagnosis Date Noted  . Rhinorrhea 12/26/2020  . Acute bronchitis 07/09/2017  . Basal cell carcinoma of face 09/18/2016  . Diabetes ([place]) 02/02/2016  . Oral lichen planus 31/54/0086  . Hx of colonic polyp 09/30/2013  . Essential hypertension 09/13/2010  . Seasonal allergic rhinitis 02/17/2010  . GILBERT'S SYNDROME 07/25/2009  . CORONARY ATHEROSCLEROSIS NATIVE CORONARY ARTERY 06/06/2009  . HYPERLIPIDEMIA 07/28/2007  . BPH (benign prostatic hyperplasia) 07/28/2007    Current Outpatient Medications on File Prior to Visit  Medication Sig Dispense Refill  . aspirin EC 81 MG tablet Take 81 mg by mouth daily.    Marland Kitchen augmented betamethasone dipropionate (DIPROLENE-AF) 0.05 % cream Apply 1 application topically as needed (iritation).     . blood glucose meter kit and supplies KIT Dispense based on patient and insurance preference. Use up to four times daily as directed. (FOR E11.9). 1 each 0  . co-enzyme Q-10 30 MG capsule Take 100 mg by mouth as needed (supplement).     . empagliflozin (JARDIANCE) 10 MG TABS tablet Take 1 tablet (10 mg total) by mouth daily. 30 tablet 5  . hydrochlorothiazide (HYDRODIURIL) 25 MG tablet Take 1 tablet (25 mg total) by mouth daily. 90 tablet 3  . ibuprofen (ADVIL) 800 MG tablet Take 1 tablet (800 mg total) by mouth daily as needed for moderate pain. 90 tablet 0  . losartan (COZAAR) 100 MG tablet Take 1 tablet (100 mg total) by mouth daily. 90 tablet 1  .  metoprolol tartrate (LOPRESSOR) 25 MG tablet TAKE 1 TABLET(25 MG) BY MOUTH TWICE DAILY 180 tablet 1  . Multiple Vitamin (MULTIVITAMIN) tablet Take 1 tablet by mouth daily.    Marland Kitchen MYRBETRIQ 25 MG TB24 tablet Take 25 mg by mouth daily.    . ONE TOUCH ULTRA TEST test strip TEST UP TO FOUR TIMES DAILY AS DIRECTED 300 each 5  . OneTouch Delica Lancets 76P MISC TEST UP TO FOUR TIMES DAILY AS DIRECTED 100 each 2  . rosuvastatin (CRESTOR) 40 MG tablet TAKE 1 TABLET BY MOUTH DAILY 90 tablet 2  . metFORMIN (GLUCOPHAGE) 1000 MG tablet TAKE 1 TABLET(1000 MG) BY MOUTH TWICE DAILY WITH A MEAL 180 tablet 1  . triamcinolone (KENALOG) 0.1 % Apply topically 2 (two) times daily.     No current facility-administered medications on file prior to visit.    Past Medical History:  Diagnosis Date  . Coronary artery disease   . Hyperlipidemia   . 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 Hypertension   . Lichen planus    oral  . Other abnormal glucose     Past Surgical History:  Procedure Laterality Date  . APPENDECTOMY    . CATARACT EXTRACTION W/ INTRAOCULAR LENS IMPLANT Right 2009   Dr Bing Plume  . CHOLECYSTECTOMY    . CORONARY ANGIOPLASTY WITH STENT PLACEMENT  2000  . REFRACTIVE SURGERY  1999   Dr Bing Plume  . TONSILLECTOMY    . VASECTOMY      Social History   Socioeconomic History  . Marital status: Married    Spouse name: Not on file  . Number of children: Not on file  . Years of education: Not on file  . Highest education level: Not on file  Occupational History  . Occupation: retired Engineer, site: RETIRED  Tobacco Use  . Smoking status: Former Smoker    Quit date: 10/15/1997    Years since quitting: 23.2  . Smokeless tobacco: Never Used  . Tobacco comment: smoked 1961-1999, up to 1/2 ppd. Off 3-4 years 1961-1999. Cigar 3/week for 1 year  Substance and Sexual Activity  . Alcohol use: Yes    Alcohol/week: 0.0 standard drinks    Comment: socially  1-2/ day   . Drug use: No  . Sexual activity: Yes  Other Topics Concern  . Not on file  Social History Narrative  . Not on file   Social Determinants of Health   Financial Resource Strain: Not on file  Food Insecurity: Not on file  Transportation Needs: Not on file  Physical Activity: Not on file  Stress: Not on file  Social Connections: Not on file    Family History  Problem Relation Age of Onset  . Coronary artery disease Father        MIx3. Initial MI @ 85  . Diabetes Father   . Stroke Father   . COPD Mother   . Diabetes Mother   . Hypertension Brother        DM- CABG  . Kidney failure Sister     Review of Systems  Constitutional: Negative for chills and fever.  HENT: Positive for postnasal drip and rhinorrhea.   Respiratory: Positive for cough (PND). Negative for shortness of breath and wheezing.   Cardiovascular: Negative for chest pain, palpitations and leg swelling.  Neurological: Negative for dizziness, light-headedness and headaches.       Objective:   Vitals:   12/26/20 0833  BP: 128/78  Pulse: (!) 57  Temp: 98 F (36.7 C)  SpO2: 97%   BP Readings from Last 3 Encounters:  12/26/20 128/78  06/27/20 132/82  12/28/19 134/80   Wt Readings from Last 3 Encounters:  12/26/20 245 lb (111.1 kg)  06/27/20 248 lb 6.4 oz (112.7 kg)  12/28/19 248 lb (112.5 kg)   Body mass index is 33.23 kg/m.   Physical Exam    Constitutional: Appears well-developed and well-nourished. No distress.  HENT:  Head: Normocephalic and atraumatic.  Neck: Neck supple. No tracheal deviation present. No thyromegaly present.  No cervical lymphadenopathy Cardiovascular: Normal rate, regular rhythm and normal heart sounds.   No murmur heard. No carotid bruit .  No edema Pulmonary/Chest: Effort normal and breath sounds normal. No respiratory distress. No has no wheezes. No rales.  Skin: Skin is warm and dry. Not diaphoretic.  Psychiatric: Normal mood and affect. Behavior is normal.       Assessment & Plan:    See Problem List for Assessment and Plan of chronic medical problems.    This visit occurred during the SARS-CoV-2 public health emergency.  Safety protocols were in place, including screening questions prior to the visit, additional usage of staff PPE, and extensive cleaning of exam room while observing appropriate contact time as indicated for disinfecting solutions.

## 2020-12-25 NOTE — Patient Instructions (Addendum)
  Blood work was ordered.     Medications changes include :   Ipratropium nasal spray  Your prescription(s) have been submitted to your pharmacy. Please take as directed and contact our office if you believe you are having problem(s) with the medication(s).     Please followup in 6 months

## 2020-12-26 ENCOUNTER — Encounter: Payer: Self-pay | Admitting: Internal Medicine

## 2020-12-26 ENCOUNTER — Ambulatory Visit (INDEPENDENT_AMBULATORY_CARE_PROVIDER_SITE_OTHER): Payer: PPO | Admitting: Internal Medicine

## 2020-12-26 ENCOUNTER — Other Ambulatory Visit: Payer: Self-pay

## 2020-12-26 VITALS — BP 128/78 | HR 57 | Temp 98.0°F | Ht 72.0 in | Wt 245.0 lb

## 2020-12-26 DIAGNOSIS — E782 Mixed hyperlipidemia: Secondary | ICD-10-CM

## 2020-12-26 DIAGNOSIS — E119 Type 2 diabetes mellitus without complications: Secondary | ICD-10-CM

## 2020-12-26 DIAGNOSIS — J3489 Other specified disorders of nose and nasal sinuses: Secondary | ICD-10-CM | POA: Diagnosis not present

## 2020-12-26 DIAGNOSIS — I10 Essential (primary) hypertension: Secondary | ICD-10-CM | POA: Diagnosis not present

## 2020-12-26 DIAGNOSIS — I2510 Atherosclerotic heart disease of native coronary artery without angina pectoris: Secondary | ICD-10-CM | POA: Diagnosis not present

## 2020-12-26 DIAGNOSIS — Z125 Encounter for screening for malignant neoplasm of prostate: Secondary | ICD-10-CM | POA: Diagnosis not present

## 2020-12-26 LAB — LIPID PANEL
Cholesterol: 92 mg/dL (ref 0–200)
HDL: 30.9 mg/dL — ABNORMAL LOW (ref >39.00)
LDL Cholesterol: 39 mg/dL (ref 0–99)
NonHDL: 61.25
Total CHOL/HDL Ratio: 3
Triglycerides: 112 mg/dL (ref 0.0–149.0)
VLDL: 22.4 mg/dL (ref 0.0–40.0)

## 2020-12-26 LAB — COMPREHENSIVE METABOLIC PANEL
ALT: 22 U/L (ref 0–53)
AST: 18 U/L (ref 0–37)
Albumin: 4.6 g/dL (ref 3.5–5.2)
Alkaline Phosphatase: 51 U/L (ref 39–117)
BUN: 27 mg/dL — ABNORMAL HIGH (ref 6–23)
CO2: 29 mEq/L (ref 19–32)
Calcium: 9.9 mg/dL (ref 8.4–10.5)
Chloride: 103 mEq/L (ref 96–112)
Creatinine, Ser: 1.04 mg/dL (ref 0.40–1.50)
GFR: 69.35 mL/min (ref >60.00)
Glucose, Bld: 119 mg/dL — ABNORMAL HIGH (ref 70–99)
Potassium: 4.9 mEq/L (ref 3.5–5.1)
Sodium: 138 mEq/L (ref 135–145)
Total Bilirubin: 1.2 mg/dL (ref 0.2–1.2)
Total Protein: 6.8 g/dL (ref 6.0–8.3)

## 2020-12-26 LAB — HEMOGLOBIN A1C: Hgb A1c MFr Bld: 6.6 % — ABNORMAL HIGH (ref 4.6–6.5)

## 2020-12-26 LAB — PSA: PSA: 1.13 ng/mL (ref 0.10–4.00)

## 2020-12-26 MED ORDER — IPRATROPIUM BROMIDE 0.06 % NA SOLN: 2.0000 | Freq: Four times a day (QID) | NASAL | 12 refills | Status: DC

## 2020-12-26 NOTE — Assessment & Plan Note (Signed)
New problem Intermittent Would like to try ipratropium nasal spray Will prescribed - to use as needed

## 2020-12-26 NOTE — Assessment & Plan Note (Signed)
Chronic Following with cardiology No concerning symptoms of angina Continue current medications

## 2020-12-26 NOTE — Assessment & Plan Note (Addendum)
Chronic BP well controlled Continue hctz 25mg  qd, losartan 100 mg qd, metoprolol 25 mg BID cmp

## 2020-12-26 NOTE — Assessment & Plan Note (Signed)
Chronic controlled Continue Jardiance 10 mg daily, Metformin 1000 mg twice daily Check A1c

## 2020-12-26 NOTE — Assessment & Plan Note (Signed)
Chronic Check lipid panel  Continue Crestor 40 mg daily Regular exercise and healthy diet encouraged  

## 2021-01-09 ENCOUNTER — Telehealth: Payer: Self-pay | Admitting: Internal Medicine

## 2021-01-09 DIAGNOSIS — R351 Nocturia: Secondary | ICD-10-CM | POA: Diagnosis not present

## 2021-01-09 DIAGNOSIS — N401 Enlarged prostate with lower urinary tract symptoms: Secondary | ICD-10-CM | POA: Diagnosis not present

## 2021-01-09 NOTE — Telephone Encounter (Signed)
LVM for pt to rtn my call to schedule AWV with [facility]. Please schedule AWV if pt calls the office  

## 2021-02-05 ENCOUNTER — Other Ambulatory Visit: Payer: Self-pay | Admitting: Internal Medicine

## 2021-03-18 ENCOUNTER — Other Ambulatory Visit: Payer: Self-pay | Admitting: Internal Medicine

## 2021-03-20 NOTE — Telephone Encounter (Signed)
1.Medication Requested: losartan (COZAAR) 100 MG tablet    2. Pharmacy (Name, Street, [place]): [facility], [place]  3. On Med List: yes   4. Last Visit with PCP: 12-26-20  5. Next visit date with PCP: 07-03-21  Patient requesting 90 day supply. Please advise    Agent: Please be advised that RX refills may take up to 3 business days. We ask that you follow-up with your pharmacy.

## 2021-04-03 ENCOUNTER — Telehealth: Payer: Self-pay | Admitting: Internal Medicine

## 2021-04-03 ENCOUNTER — Other Ambulatory Visit: Payer: Self-pay

## 2021-04-03 MED ORDER — EMPAGLIFLOZIN 10 MG PO TABS: 10.0000 mg | ORAL_TABLET | Freq: Every day | ORAL | 5 refills | Status: DC

## 2021-04-03 MED ORDER — METFORMIN HCL 1000 MG PO TABS: ORAL_TABLET | ORAL | 1 refills | Status: DC

## 2021-04-03 MED ORDER — IBUPROFEN 800 MG PO TABS: 800.0000 mg | ORAL_TABLET | Freq: Every day | ORAL | 0 refills | Status: DC | PRN

## 2021-04-03 NOTE — Telephone Encounter (Signed)
1.Medication Requested: empagliflozin (JARDIANCE) 10 MG TABS tablet  ibuprofen (ADVIL) 800 MG tablet  metFORMIN (GLUCOPHAGE) 1000 MG tablet  2. Pharmacy (Name, Street, [place]): [facility] [place]  3. On Med List: yes   4. Last Visit with PCP: 12-26-20  5. Next visit date with PCP: 07-03-21   Agent: Please be advised that RX refills may take up to 3 business days. We ask that you follow-up with your pharmacy.

## 2021-04-04 ENCOUNTER — Other Ambulatory Visit: Payer: Self-pay | Admitting: Cardiovascular Disease

## 2021-04-04 DIAGNOSIS — L820 Inflamed seborrheic keratosis: Secondary | ICD-10-CM | POA: Diagnosis not present

## 2021-04-04 DIAGNOSIS — L821 Other seborrheic keratosis: Secondary | ICD-10-CM | POA: Diagnosis not present

## 2021-04-04 DIAGNOSIS — L570 Actinic keratosis: Secondary | ICD-10-CM | POA: Diagnosis not present

## 2021-04-04 DIAGNOSIS — Z85828 Personal history of other malignant neoplasm of skin: Secondary | ICD-10-CM | POA: Diagnosis not present

## 2021-07-02 NOTE — Patient Instructions (Addendum)
  Blood work was ordered.      Medications changes include :   none     Please followup in 6 months for a physical

## 2021-07-02 NOTE — Progress Notes (Signed)
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   MRN: [id number]  This visit occurred during the SARS-CoV-2 public health emergency.  Safety protocols were in place, including screening questions prior to the visit, additional usage of staff PPE, and extensive cleaning of exam room while observing appropriate contact time as indicated for disinfecting solutions.     HPI The patient is here for follow up of their chronic medical problems, including CAD, htn, DM, hichol  When he first stands he feels a little off balance and then he is ok.  Sometimes it occurs when he is walking.  It lasts 5 seconds or so.  He knows he needs to stabilize himself before he moves.     Medications and allergies reviewed with patient and updated if appropriate.  Patient Active Problem List   Diagnosis Date Noted   Rhinorrhea 12/26/2020   Basal cell carcinoma of face 09/18/2016   Diabetes ([place]) 21/94/7125   Oral lichen planus 27/09/9289   Hx of colonic polyp 09/30/2013   Essential hypertension 09/13/2010   Seasonal allergic rhinitis 02/17/2010   GILBERT'S SYNDROME 07/25/2009   CORONARY ATHEROSCLEROSIS NATIVE CORONARY ARTERY 06/06/2009   HYPERLIPIDEMIA 07/28/2007   BPH (benign prostatic hyperplasia) 07/28/2007    Current Outpatient Medications on File Prior to Visit  Medication Sig Dispense Refill   augmented betamethasone dipropionate (DIPROLENE-AF) 0.05 % cream Apply 1 application topically as needed (iritation).      blood glucose meter kit and supplies KIT Dispense based on patient and insurance preference. Use up to four times daily as directed. (FOR E11.9). 1 each 0   co-enzyme Q-10 30 MG capsule Take 100 mg by mouth as needed (supplement).      empagliflozin (JARDIANCE) 10 MG TABS tablet Take 1 tablet (10 mg total) by mouth daily. 30 tablet 5   hydrochlorothiazide (HYDRODIURIL) 25 MG tablet Take 1 tablet (25 mg total) by mouth daily. 90 tablet 3    ibuprofen (ADVIL) 800 MG tablet Take 1 tablet (800 mg total) by mouth daily as needed for moderate pain. 90 tablet 0   ipratropium (ATROVENT) 0.06 % nasal spray Place 2 sprays into both nostrils 4 (four) times daily. 15 mL 12   losartan (COZAAR) 100 MG tablet TAKE 1 TABLET(100 MG) BY MOUTH DAILY 90 tablet 1   metFORMIN (GLUCOPHAGE) 1000 MG tablet TAKE 1 TABLET(1000 MG) BY MOUTH TWICE DAILY WITH A MEAL 180 tablet 1   metoprolol tartrate (LOPRESSOR) 25 MG tablet TAKE 1 TABLET(25 MG) BY MOUTH TWICE DAILY 180 tablet 1   Multiple Vitamin (MULTIVITAMIN) tablet Take 1 tablet by mouth daily.     ONE TOUCH ULTRA TEST test strip TEST UP TO FOUR TIMES DAILY AS DIRECTED 300 each 5   OneTouch Delica Lancets 90B MISC TEST UP TO FOUR TIMES DAILY AS DIRECTED 100 each 2   rosuvastatin (CRESTOR) 40 MG tablet Take 1 tablet (40 mg total) by mouth daily. Please make overdue appt with Dr. Burt Knack before anymore refills. Thank you 1st attempt 30 tablet 0   tamsulosin (FLOMAX) 0.4 MG CAPS capsule Take 0.4 mg by mouth at bedtime.     triamcinolone (KENALOG) 0.1 % Apply topically 2 (two) times daily.     Zinc 25 MG TABS Take by mouth.     aspirin EC 81 MG tablet Take 81 mg by mouth daily. (Patient not taking: Reported on 07/03/2021)     No current facility-administered medications  on file prior to visit.    Past Medical History:  Diagnosis Date   Coronary artery disease    Hyperlipidemia    Hyperplasia of prostate without lower urinary tract symptoms (LUTS)    Hypertension    Lichen planus    oral   Other abnormal glucose     Past Surgical History:  Procedure Laterality Date   APPENDECTOMY     CATARACT EXTRACTION W/ INTRAOCULAR LENS IMPLANT Right 2009   Dr Bing Plume   CHOLECYSTECTOMY     CORONARY ANGIOPLASTY WITH STENT PLACEMENT  2000   REFRACTIVE SURGERY  1999   Dr Bing Plume   TONSILLECTOMY     VASECTOMY      Social History   Socioeconomic History   Marital status: Married    Spouse name: Not on file    Number of children: Not on file   Years of education: Not on file   Highest education level: Not on file  Occupational History   Occupation: retired Engineer, site: RETIRED  Tobacco Use   Smoking status: Former    Types: Cigarettes    Quit date: 10/15/1997    Years since quitting: 23.7   Smokeless tobacco: Never   Tobacco comments:    smoked 1961-1999, up to 1/2 ppd. Off 3-4 years 1961-1999. Cigar 3/week for 1 year  Substance and Sexual Activity   Alcohol use: Yes    Alcohol/week: 0.0 standard drinks    Comment: socially  1-2/ day   Drug use: No   Sexual activity: Yes  Other Topics Concern   Not on file  Social History Narrative   Not on file   Social Determinants of Health   Financial Resource Strain: Not on file  Food Insecurity: Not on file  Transportation Needs: Not on file  Physical Activity: Not on file  Stress: Not on file  Social Connections: Not on file    Family History  Problem Relation Age of Onset   Coronary artery disease Father        MIx3. Initial MI @ 71   Diabetes Father    Stroke Father    COPD Mother    Diabetes Mother    Hypertension Brother        DM- CABG   Kidney failure Sister     Review of Systems  Constitutional:  Negative for chills and fever.  Respiratory:  Negative for cough, shortness of breath and wheezing.   Cardiovascular:  Negative for chest pain and leg swelling.  Neurological:  Negative for dizziness, light-headedness, numbness and headaches.      Objective:   Vitals:   07/03/21 0833  BP: 130/78  Pulse: (!) 48  Temp: 98.1 F (36.7 C)  SpO2: 98%   BP Readings from Last 3 Encounters:  07/03/21 130/78  12/26/20 128/78  06/27/20 132/82   Wt Readings from Last 3 Encounters:  07/03/21 238 lb (108 kg)  12/26/20 245 lb (111.1 kg)  06/27/20 248 lb 6.4 oz (112.7 kg)   Body mass index is 32.28 kg/m.  Depression screen [facility] 2/9 12/28/2019 07/15/2018 07/15/2018 07/09/2017 03/19/2016  Decreased Interest 0 0 0 0 0   Down, Depressed, Hopeless 0 0 0 0 0  PHQ - 2 Score 0 0 0 0 0  Altered sleeping - 0 - - -  Tired, decreased energy - 0 - - -  Change in appetite - 0 - - -  Feeling bad or failure about yourself  - 0 - - -  Trouble concentrating - 0 - - -  Moving slowly or fidgety/restless - 0 - - -  Suicidal thoughts - 0 - - -  PHQ-9 Score - 0 - - -    No flowsheet data found.     Physical Exam    Constitutional: Appears well-developed and well-nourished. No distress.  HENT:  Head: Normocephalic and atraumatic.  Neck: Neck supple. No tracheal deviation present. No thyromegaly present.  No cervical lymphadenopathy Cardiovascular: Normal rate, regular rhythm and normal heart sounds.   No murmur heard. No carotid bruit .  No edema Pulmonary/Chest: Effort normal and breath sounds normal. No respiratory distress. No has no wheezes. No rales.  Skin: Skin is warm and dry. Not diaphoretic.  Psychiatric: Normal mood and affect. Behavior is normal.      Assessment & Plan:    See Problem List for Assessment and Plan of chronic medical problems.

## 2021-07-03 ENCOUNTER — Ambulatory Visit (INDEPENDENT_AMBULATORY_CARE_PROVIDER_SITE_OTHER): Payer: PPO | Admitting: Internal Medicine

## 2021-07-03 ENCOUNTER — Other Ambulatory Visit: Payer: Self-pay

## 2021-07-03 ENCOUNTER — Telehealth: Payer: Self-pay | Admitting: Internal Medicine

## 2021-07-03 ENCOUNTER — Encounter: Payer: Self-pay | Admitting: Internal Medicine

## 2021-07-03 VITALS — BP 130/78 | HR 48 | Temp 98.1°F | Ht 72.0 in | Wt 238.0 lb

## 2021-07-03 DIAGNOSIS — Z125 Encounter for screening for malignant neoplasm of prostate: Secondary | ICD-10-CM

## 2021-07-03 DIAGNOSIS — I2510 Atherosclerotic heart disease of native coronary artery without angina pectoris: Secondary | ICD-10-CM

## 2021-07-03 DIAGNOSIS — I10 Essential (primary) hypertension: Secondary | ICD-10-CM | POA: Diagnosis not present

## 2021-07-03 DIAGNOSIS — E782 Mixed hyperlipidemia: Secondary | ICD-10-CM

## 2021-07-03 DIAGNOSIS — E1169 Type 2 diabetes mellitus with other specified complication: Secondary | ICD-10-CM

## 2021-07-03 LAB — COMPREHENSIVE METABOLIC PANEL
ALT: 20 U/L (ref 0–53)
AST: 18 U/L (ref 0–37)
Albumin: 4.6 g/dL (ref 3.5–5.2)
Alkaline Phosphatase: 48 U/L (ref 39–117)
BUN: 23 mg/dL (ref 6–23)
CO2: 29 mEq/L (ref 19–32)
Calcium: 10.4 mg/dL (ref 8.4–10.5)
Chloride: 104 mEq/L (ref 96–112)
Creatinine, Ser: 1.34 mg/dL (ref 0.40–1.50)
GFR: 50.98 mL/min — ABNORMAL LOW (ref >60.00)
Glucose, Bld: 113 mg/dL — ABNORMAL HIGH (ref 70–99)
Potassium: 5.1 mEq/L (ref 3.5–5.1)
Sodium: 140 mEq/L (ref 135–145)
Total Bilirubin: 1.2 mg/dL (ref 0.2–1.2)
Total Protein: 7 g/dL (ref 6.0–8.3)

## 2021-07-03 LAB — LIPID PANEL
Cholesterol: 116 mg/dL (ref 0–200)
HDL: 39.2 mg/dL (ref >39.00)
LDL Cholesterol: 59 mg/dL (ref 0–99)
NonHDL: 76.5
Total CHOL/HDL Ratio: 3
Triglycerides: 87 mg/dL (ref 0.0–149.0)
VLDL: 17.4 mg/dL (ref 0.0–40.0)

## 2021-07-03 LAB — HEMOGLOBIN A1C: Hgb A1c MFr Bld: 6.3 % (ref 4.6–6.5)

## 2021-07-03 NOTE — Assessment & Plan Note (Signed)
Chronic BP well controlled Continue hydrochlorothiazide 25 mg daily, losartan 100 mg daily, metoprolol 25 mg twice daily cmp

## 2021-07-03 NOTE — Assessment & Plan Note (Addendum)
Chronic Lab Results  Component Value Date   HGBA1C 6.6 (H) 12/26/2020   Controlled Eye exam up to date - no complications - [facility] Continue metformin 1000 mg twice daily, Jardiance 10 mg daily A1c

## 2021-07-03 NOTE — Addendum Note (Signed)
Addended by: Boris Lown B on: 07/03/2021 09:02 AM   Modules accepted: Orders

## 2021-07-03 NOTE — Telephone Encounter (Signed)
Patient is unable to get into my chart to view labs, is asking for his lab results to be emailed to him at [email or web address]  Explained to the patient that records are probably not released yet, tried to reset his my chart also, patient still having trouble

## 2021-07-03 NOTE — Assessment & Plan Note (Signed)
Chronic Following with cardiology No symptoms to suggest angina Continue aspirin 81 mg daily, Jardiance 10 mg daily, rosuvastatin 40 mg daily

## 2021-07-03 NOTE — Assessment & Plan Note (Signed)
Chronic Check lipid panel  Continue rosuvastatin 40 mg daily Regular exercise and healthy diet encouraged

## 2021-07-04 LAB — PSA, TOTAL AND FREE
PSA, % Free: 27 % (calc) (ref >25)
PSA, Free: 0.3 ng/mL
PSA, Total: 1.1 ng/mL (ref <=4.0)

## 2021-07-05 NOTE — Telephone Encounter (Signed)
Mailed out to patient today

## 2021-08-11 ENCOUNTER — Other Ambulatory Visit: Payer: Self-pay | Admitting: Internal Medicine

## 2021-08-14 ENCOUNTER — Other Ambulatory Visit: Payer: Self-pay | Admitting: Internal Medicine

## 2021-08-16 ENCOUNTER — Other Ambulatory Visit: Payer: Self-pay | Admitting: Cardiovascular Disease

## 2021-08-22 DIAGNOSIS — D485 Neoplasm of uncertain behavior of skin: Secondary | ICD-10-CM | POA: Diagnosis not present

## 2021-08-22 DIAGNOSIS — L570 Actinic keratosis: Secondary | ICD-10-CM | POA: Diagnosis not present

## 2021-08-22 DIAGNOSIS — L918 Other hypertrophic disorders of the skin: Secondary | ICD-10-CM | POA: Diagnosis not present

## 2021-08-22 DIAGNOSIS — L720 Epidermal cyst: Secondary | ICD-10-CM | POA: Diagnosis not present

## 2021-08-22 DIAGNOSIS — L821 Other seborrheic keratosis: Secondary | ICD-10-CM | POA: Diagnosis not present

## 2021-08-22 DIAGNOSIS — D2371 Other benign neoplasm of skin of right lower limb, including hip: Secondary | ICD-10-CM | POA: Diagnosis not present

## 2021-08-22 DIAGNOSIS — Z85828 Personal history of other malignant neoplasm of skin: Secondary | ICD-10-CM | POA: Diagnosis not present

## 2021-08-22 DIAGNOSIS — D044 Carcinoma in situ of skin of scalp and neck: Secondary | ICD-10-CM | POA: Diagnosis not present

## 2021-08-22 DIAGNOSIS — D1801 Hemangioma of skin and subcutaneous tissue: Secondary | ICD-10-CM | POA: Diagnosis not present

## 2021-09-28 ENCOUNTER — Other Ambulatory Visit: Payer: Self-pay | Admitting: Internal Medicine

## 2021-10-12 ENCOUNTER — Other Ambulatory Visit: Payer: Self-pay | Admitting: Cardiovascular Disease

## 2021-10-30 ENCOUNTER — Other Ambulatory Visit: Payer: Self-pay | Admitting: Internal Medicine

## 2021-11-09 ENCOUNTER — Other Ambulatory Visit: Payer: Self-pay | Admitting: Internal Medicine

## 2021-11-19 ENCOUNTER — Other Ambulatory Visit: Payer: Self-pay | Admitting: Cardiovascular Disease

## 2021-11-29 ENCOUNTER — Telehealth: Payer: Self-pay | Admitting: Internal Medicine

## 2021-11-29 NOTE — Telephone Encounter (Signed)
Pt requesting a call back to discuss "renewal" of ONE TOUCH 2 ULTRA TEST

## 2021-11-29 NOTE — Telephone Encounter (Signed)
Message left for patient to return call to clinic.  If he calls back please obtain more details on what it is that he is needing so we can get it sent in for him.

## 2021-11-30 NOTE — Telephone Encounter (Signed)
Patient just needs lancets for one touch ultra

## 2021-12-31 ENCOUNTER — Encounter: Payer: Self-pay | Admitting: Internal Medicine

## 2021-12-31 NOTE — Patient Instructions (Addendum)
? ? ? ?  Blood work was ordered.   ? ? ?Medications changes include :   none ? ? ? ? ?Return in about 6 months (around 07/04/2022) for follow up. ? ?

## 2021-12-31 NOTE — Progress Notes (Signed)
? ? ? ? ?Subjective:  ? ? 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   MRN: [id number] ? ?This visit occurred during the SARS-CoV-2 public health emergency.  Safety protocols were in place, including screening questions prior to the visit, additional usage of staff PPE, and extensive cleaning of exam room while observing appropriate contact time as indicated for disinfecting solutions.   ? ? ?HPI ?[name] is here for follow up of his chronic medical problems, including CAD, htn, DM, HLD ? ?Taking ibuprofen 400 mg daily for OA.   Not taking ASA.  ? ?He is exercising regularly.    ? ? ?Medications and allergies reviewed with patient and updated if appropriate. ? ?Current Outpatient Medications on File Prior to Visit  ?Medication Sig Dispense Refill  ? augmented betamethasone dipropionate (DIPROLENE-AF) 0.05 % cream Apply 1 application topically as needed (iritation).     ? blood glucose meter kit and supplies KIT Dispense based on patient and insurance preference. Use up to four times daily as directed. (FOR E11.9). 1 each 0  ? empagliflozin (JARDIANCE) 10 MG TABS tablet Take 1 tablet (10 mg total) by mouth daily. 30 tablet 5  ? hydrochlorothiazide (HYDRODIURIL) 25 MG tablet Take 1 tablet (25 mg total) by mouth daily. 90 tablet 3  ? ibuprofen (ADVIL) 800 MG tablet TAKE 1 TABLET(800 MG) BY MOUTH DAILY AS NEEDED FOR MODERATE PAIN 90 tablet 0  ? ipratropium (ATROVENT) 0.06 % nasal spray Place 2 sprays into both nostrils 4 (four) times daily. 15 mL 12  ? losartan (COZAAR) 100 MG tablet TAKE 1 TABLET(100 MG) BY MOUTH DAILY 90 tablet 1  ? metFORMIN (GLUCOPHAGE) 1000 MG tablet TAKE 1 TABLET(1000 MG) BY MOUTH TWICE DAILY WITH A MEAL 180 tablet 1  ? metoprolol tartrate (LOPRESSOR) 25 MG tablet TAKE 1 TABLET(25 MG) BY MOUTH TWICE DAILY 180 tablet 1  ? ONE TOUCH ULTRA TEST test strip TEST UP TO FOUR TIMES DAILY AS DIRECTED 300 each 5  ? OneTouch Delica Lancets 20U MISC TEST UP TO FOUR TIMES DAILY AS DIRECTED  100 each 2  ? rosuvastatin (CRESTOR) 40 MG tablet Take 1 tablet (40 mg total) by mouth daily. Please make overdue appt with Dr. Burt Knack before anymore refills. Thank you 2nd attempt 15 tablet 0  ? triamcinolone (KENALOG) 0.1 % Apply topically 2 (two) times daily.    ? Zinc 25 MG TABS Take by mouth.    ? aspirin EC 81 MG tablet Take 81 mg by mouth daily. (Patient not taking: Reported on 07/03/2021)    ? co-enzyme Q-10 30 MG capsule Take 100 mg by mouth as needed (supplement).  (Patient not taking: Reported on 01/01/2022)    ? Multiple Vitamin (MULTIVITAMIN) tablet Take 1 tablet by mouth daily. (Patient not taking: Reported on 01/01/2022)    ? tamsulosin (FLOMAX) 0.4 MG CAPS capsule Take 0.4 mg by mouth at bedtime. (Patient not taking: Reported on 01/01/2022)    ? ?No current facility-administered medications on file prior to visit.  ? ? ? ?Review of Systems  ?Constitutional:  Negative for fever.  ?Respiratory:  Negative for cough, shortness of breath and wheezing.   ?Cardiovascular:  Negative for chest pain, palpitations and leg swelling.  ?Neurological:  Negative for light-headedness and headaches.  ? ?   ?Objective:  ? ?Vitals:  ? 01/01/22 0824  ?BP: 138/76  ?Pulse: (!) 44  ?Temp: 98 ?F (36.7 ?C)  ?SpO2: 97%  ? ?BP Readings from Last  3 Encounters:  ?01/01/22 138/76  ?07/03/21 130/78  ?12/26/20 128/78  ? ?Wt Readings from Last 3 Encounters:  ?01/01/22 247 lb (112 kg)  ?07/03/21 238 lb (108 kg)  ?12/26/20 245 lb (111.1 kg)  ? ?Body mass index is 33.5 kg/m?. ? ?  ?Physical Exam ?Constitutional:   ?   General: He is not in acute distress. ?   Appearance: Normal appearance. He is not ill-appearing.  ?HENT:  ?   Head: Normocephalic and atraumatic.  ?Eyes:  ?   Conjunctiva/sclera: Conjunctivae normal.  ?Cardiovascular:  ?   Rate and Rhythm: Normal rate and regular rhythm.  ?   Heart sounds: Normal heart sounds. No murmur heard. ?Pulmonary:  ?   Effort: Pulmonary effort is normal. No respiratory distress.  ?   Breath sounds:  Normal breath sounds. No wheezing or rales.  ?Musculoskeletal:  ?   Right lower leg: No edema.  ?   Left lower leg: No edema.  ?Skin: ?   General: Skin is warm and dry.  ?   Findings: No rash.  ?Neurological:  ?   Mental Status: He is alert. Mental status is at baseline.  ?Psychiatric:     ?   Mood and Affect: Mood normal.  ? ?   ? ?Lab Results  ?Component Value Date  ? WBC 7.9 06/27/2020  ? HGB 16.0 06/27/2020  ? HCT 46.5 06/27/2020  ? PLT 169 06/27/2020  ? GLUCOSE 113 (H) 07/03/2021  ? CHOL 116 07/03/2021  ? TRIG 87.0 07/03/2021  ? HDL 39.20 07/03/2021  ? [place] 59 07/03/2021  ? ALT 20 07/03/2021  ? AST 18 07/03/2021  ? NA 140 07/03/2021  ? K 5.1 07/03/2021  ? CL 104 07/03/2021  ? CREATININE 1.34 07/03/2021  ? BUN 23 07/03/2021  ? CO2 29 07/03/2021  ? TSH 2.88 04/03/2019  ? PSA 1.13 12/26/2020  ? INR 1.0 05/03/2008  ? HGBA1C 6.3 07/03/2021  ? MICROALBUR 0.8 02/01/2016  ? ? ? ?Assessment & Plan:  ? ? ?See Problem List for Assessment and Plan of chronic medical problems.  ? ? ?

## 2022-01-01 ENCOUNTER — Encounter: Payer: Self-pay | Admitting: Internal Medicine

## 2022-01-01 ENCOUNTER — Other Ambulatory Visit: Payer: Self-pay

## 2022-01-01 ENCOUNTER — Ambulatory Visit (INDEPENDENT_AMBULATORY_CARE_PROVIDER_SITE_OTHER): Payer: PPO | Admitting: Internal Medicine

## 2022-01-01 VITALS — BP 138/76 | HR 44 | Temp 98.0°F | Ht 72.0 in | Wt 247.0 lb

## 2022-01-01 DIAGNOSIS — E1169 Type 2 diabetes mellitus with other specified complication: Secondary | ICD-10-CM | POA: Diagnosis not present

## 2022-01-01 DIAGNOSIS — E782 Mixed hyperlipidemia: Secondary | ICD-10-CM | POA: Diagnosis not present

## 2022-01-01 DIAGNOSIS — I2510 Atherosclerotic heart disease of native coronary artery without angina pectoris: Secondary | ICD-10-CM

## 2022-01-01 DIAGNOSIS — I10 Essential (primary) hypertension: Secondary | ICD-10-CM

## 2022-01-01 LAB — CBC WITH DIFFERENTIAL/PLATELET
Basophils Absolute: 0 10*3/uL (ref 0.0–0.1)
Basophils Relative: 0.5 % (ref 0.0–3.0)
Eosinophils Absolute: 0.2 10*3/uL (ref 0.0–0.7)
Eosinophils Relative: 2.8 % (ref 0.0–5.0)
HCT: 48.6 % (ref 39.0–52.0)
Hemoglobin: 16.6 g/dL (ref 13.0–17.0)
Lymphocytes Relative: 14.8 % (ref 12.0–46.0)
Lymphs Abs: 1.1 10*3/uL (ref 0.7–4.0)
MCHC: 34.2 g/dL (ref 30.0–36.0)
MCV: 92.5 fl (ref 78.0–100.0)
Monocytes Absolute: 0.7 10*3/uL (ref 0.1–1.0)
Monocytes Relative: 10.1 % (ref 3.0–12.0)
Neutro Abs: 5.3 10*3/uL (ref 1.4–7.7)
Neutrophils Relative %: 71.8 % (ref 43.0–77.0)
Platelets: 188 10*3/uL (ref 150.0–400.0)
RBC: 5.25 Mil/uL (ref 4.22–5.81)
RDW: 12.8 % (ref 11.5–15.5)
WBC: 7.4 10*3/uL (ref 4.0–10.5)

## 2022-01-01 LAB — LIPID PANEL
Cholesterol: 98 mg/dL (ref 0–200)
HDL: 33.6 mg/dL — ABNORMAL LOW (ref >39.00)
LDL Cholesterol: 43 mg/dL (ref 0–99)
NonHDL: 64.21
Total CHOL/HDL Ratio: 3
Triglycerides: 105 mg/dL (ref 0.0–149.0)
VLDL: 21 mg/dL (ref 0.0–40.0)

## 2022-01-01 LAB — COMPREHENSIVE METABOLIC PANEL
ALT: 16 U/L (ref 0–53)
AST: 15 U/L (ref 0–37)
Albumin: 4.5 g/dL (ref 3.5–5.2)
Alkaline Phosphatase: 61 U/L (ref 39–117)
BUN: 25 mg/dL — ABNORMAL HIGH (ref 6–23)
CO2: 27 mEq/L (ref 19–32)
Calcium: 9.8 mg/dL (ref 8.4–10.5)
Chloride: 102 mEq/L (ref 96–112)
Creatinine, Ser: 1.29 mg/dL (ref 0.40–1.50)
GFR: 53.17 mL/min — ABNORMAL LOW (ref >60.00)
Glucose, Bld: 133 mg/dL — ABNORMAL HIGH (ref 70–99)
Potassium: 4.3 mEq/L (ref 3.5–5.1)
Sodium: 139 mEq/L (ref 135–145)
Total Bilirubin: 1 mg/dL (ref 0.2–1.2)
Total Protein: 6.8 g/dL (ref 6.0–8.3)

## 2022-01-01 LAB — MICROALBUMIN / CREATININE URINE RATIO
Creatinine,U: 96.1 mg/dL
Microalb Creat Ratio: 1.3 mg/g (ref 0.0–30.0)
Microalb, Ur: 1.3 mg/dL (ref 0.0–1.9)

## 2022-01-01 LAB — HEMOGLOBIN A1C: Hgb A1c MFr Bld: 7.1 % — ABNORMAL HIGH (ref 4.6–6.5)

## 2022-01-01 NOTE — Assessment & Plan Note (Addendum)
Chronic ?Follow-up with cardiology ?History of stent ?No symptoms consistent with angina ?Rosuvastatin 40 mg daily, metoprolol 25 mg twice daily, Jardiance 10 mg daily  ?discussed should be taking aspirin 81 mg daily-advised to disc gust with cardiology ?

## 2022-01-01 NOTE — Assessment & Plan Note (Addendum)
Chronic ?Lab Results  ?Component Value Date  ? HGBA1C 6.3 07/03/2021  ? ?Sugars well controlled ?Check A1c, urine microalbumin today ?Continue Jardiance 10 mg daily, metformin 1000 mg twice daily ?Stressed regular exercise, diabetic diet ?Will adjust medication depending on A1c-would consider increasing Jardiance to 25 mg daily and decreasing metformin to once a day ? ?

## 2022-01-01 NOTE — Assessment & Plan Note (Signed)
Chronic Regular exercise and healthy diet encouraged Check lipid panel  Continue rosuvastatin 40 mg daily 

## 2022-01-01 NOTE — Assessment & Plan Note (Signed)
Chronic ?Blood pressure well controlled ?CMP ?Continue HCTZ 25 mg daily, losartan 100 mg daily, metoprolol 25 mg twice daily ?

## 2022-01-04 NOTE — Progress Notes (Signed)
Opened in error ?Patient declined AWV ?

## 2022-01-06 ENCOUNTER — Other Ambulatory Visit: Payer: Self-pay | Admitting: Cardiovascular Disease

## 2022-01-08 ENCOUNTER — Telehealth: Payer: Self-pay | Admitting: Internal Medicine

## 2022-01-08 NOTE — Telephone Encounter (Signed)
Pt requesting a copy of 01-01-2022 lab results ? ?Results has been printed and pt will pick up copy from the front office today ?

## 2022-02-09 ENCOUNTER — Other Ambulatory Visit: Payer: Self-pay

## 2022-02-09 MED ORDER — ROSUVASTATIN CALCIUM 40 MG PO TABS: 40.0000 mg | ORAL_TABLET | Freq: Every day | ORAL | 0 refills | Status: DC

## 2022-02-12 NOTE — Progress Notes (Signed)
?Cardiology Office Note:   ? ?Date:  02/13/2022  ? 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 ?PCP:  [name], MD  ?[facility] Providers ?Cardiologist:  [name], MD    ?Referring MD: [name], MD  ? ?Chief Complaint:  F/u for CAD ?  ? ?Patient Profile: ?Coronary artery disease ?S/p Inf MI in 05/1999 tx with stent x 3 to RCA ?S/p cutting balloon POBA to RCA 2/2 ISR in 04/2000 ?ETT 06/2012: low risk ?Myoview in 04/2008: low risk  ?Hypertension ?Diabetes mellitus ?Hyperlipidemia ?BPH ? ?Prior CV Studies: ?ETT 07/07/2012 ?Low risk ? ?Myoview 04/27/2008 ?Inferolateral thinning, no ischemia or infarction, EF 62 ? ?Cardiac catheterization 04/26/2000 ?LM ostial 25 ?LAD proximal 40, mid 50, distal diffuse 20; D3 25 ?LCx patent; OM2 25 ?RCA proximal 60, proximal stent patent, mid 30, distal stent 25, third distal stent 95; PDA 80 ?PCI: Cutting Balloon angioplasty to the RCA stent secondary to ISR ?   ? 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  He was last seen in our clinic in 12/2019 by Katina Dung, NP.  He returns for f/u.  He is here alone.  He has been doing well without chest pain, shortness of breath, syncope, orthopnea, leg edema.  He exercises on a regular basis.  He walks on a treadmill 3 to 4 days a week.  He also does yard work and Engineer, manufacturing systems.     ?   ?Past Medical History:  ?Diagnosis Date  ? Coronary artery disease   ? Hyperlipidemia   ? 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 Hypertension   ? Lichen planus   ? oral  ? Other abnormal glucose   ? ?Current Medications: ?Current Meds  ?Medication Sig  ? aspirin EC 81 MG tablet Take 1 tablet (81 mg total) by mouth daily. Swallow whole.  ? augmented betamethasone dipropionate (DIPROLENE-AF) 0.05 % cream Apply 1 application topically as needed (iritation).   ? blood glucose meter kit and supplies KIT Dispense based on patient and insurance preference. Use  up to four times daily as directed. (FOR E11.9).  ? empagliflozin (JARDIANCE) 10 MG TABS tablet Take 1 tablet (10 mg total) by mouth daily.  ? hydrochlorothiazide (HYDRODIURIL) 25 MG tablet Take 1 tablet (25 mg total) by mouth daily.  ? ibuprofen (ADVIL) 200 MG tablet Take 1 tablet (200 mg total) by mouth every 6 (six) hours as needed for headache.  ? ibuprofen (ADVIL) 800 MG tablet TAKE 1 TABLET(800 MG) BY MOUTH DAILY AS NEEDED FOR MODERATE PAIN  ? ipratropium (ATROVENT) 0.06 % nasal spray Place 2 sprays into both nostrils 4 (four) times daily.  ? losartan (COZAAR) 100 MG tablet TAKE 1 TABLET(100 MG) BY MOUTH DAILY  ? metFORMIN (GLUCOPHAGE) 1000 MG tablet TAKE 1 TABLET(1000 MG) BY MOUTH TWICE DAILY WITH A MEAL (Patient taking differently: 1,000 mg daily. TAKE 1 TABLET(1000 MG) BY MOUTH TWICE DAILY WITH A MEAL)  ? metoprolol tartrate (LOPRESSOR) 25 MG tablet TAKE 1 TABLET(25 MG) BY MOUTH TWICE DAILY  ? ONE TOUCH ULTRA TEST test strip TEST UP TO FOUR TIMES DAILY AS DIRECTED  ? OneTouch Delica Lancets 23N MISC TEST UP TO FOUR TIMES DAILY AS DIRECTED  ? rosuvastatin (CRESTOR) 40 MG tablet Take 1 tablet (40 mg total) by mouth daily.  ? triamcinolone (KENALOG) 0.1 % Apply topically 2 (two) times daily.  ?  Zinc 25 MG TABS Take by mouth.  ? [DISCONTINUED] ibuprofen (ADVIL) 200 MG tablet Take 200 mg by mouth daily.  ?  ?Allergies:   Ramipril  ? ?Social History  ? ?Tobacco Use  ? Smoking status: Former  ?  Types: Cigarettes  ?  Quit date: 10/15/1997  ?  Years since quitting: 24.3  ? Smokeless tobacco: Never  ? Tobacco comments:  ?  smoked 1961-1999, up to 1/2 ppd. Off 3-4 years 1961-1999. Cigar 3/week for 1 year  ?Substance Use Topics  ? Alcohol use: Yes  ?  Alcohol/week: 0.0 standard drinks  ?  Comment: socially  1-2/ day  ? Drug use: No  ?  ?Family Hx: ?The patient's family history includes COPD in his mother; Coronary artery disease in his father; Diabetes in his father and mother; Hypertension in his brother; Kidney  failure in his sister; Stroke in his father. ? ?Review of Systems  ?Gastrointestinal:  Negative for hematochezia.  ?Genitourinary:  Negative for hematuria.   ? ?EKGs/Labs/Other Test Reviewed:   ? ?EKG:  EKG is  ordered today.  The ekg ordered today demonstrates NSR, HR 63, first-degree AV block, PR 364, inferior Q waves, QTc 395, no change from prior tracing ? ?Recent Labs: ?01/01/2022: ALT 16; BUN 25; Creatinine, Ser 1.29; Hemoglobin 16.6; Platelets 188.0; Potassium 4.3; Sodium 139  ? ?Recent Lipid Panel ?Recent Labs  ?  01/01/22 ?0920  ?CHOL 98  ?TRIG 105.0  ?HDL 33.60*  ?VLDL 21.0  ?[place] 43  ?  ? ?Risk Assessment/Calculations:   ?  ?    ?Physical Exam:   ? ?VS:  BP 130/80   Pulse 63   Ht 6' (1.829 m)   Wt 247 lb 6.4 oz (112.2 kg)   SpO2 95%   BMI 33.55 kg/m?    ? ?Wt Readings from Last 3 Encounters:  ?02/13/22 247 lb 6.4 oz (112.2 kg)  ?01/01/22 247 lb (112 kg)  ?07/03/21 238 lb (108 kg)  ?  ?Constitutional:   ?   Appearance: Healthy appearance. Not in distress.  ?Neck:  ?   Vascular: No carotid bruit or JVR. JVD normal.  ?Pulmonary:  ?   Effort: Pulmonary effort is normal.  ?   Breath sounds: No wheezing. No rales.  ?Cardiovascular:  ?   Normal rate. Regular rhythm. Normal S1. Normal S2.   ?   Murmurs: There is no murmur.  ?Edema: ?   Peripheral edema absent.  ?Abdominal:  ?   Palpations: Abdomen is soft.  ?Skin: ?   General: Skin is warm and dry.  ?Neurological:  ?   General: No focal deficit present.  ?   Mental Status: Alert and oriented to person, place and time.  ?   Cranial Nerves: Cranial nerves are intact.  ?  ?    ?ASSESSMENT & PLAN:   ?Coronary artery disease involving native coronary artery of native heart without angina pectoris ?S/p Inf MI in 2000 tx w stent x 3 to RCA and POBA w cutting balloon in 2001.  ETT in 2013 was low risk.  He is doing well without anginal symptoms.  He stopped aspirin for unclear reasons a little over a year ago.  We discussed the importance of antiplatelet therapy  for secondary prevention.  He has been taking ibuprofen on a regular basis.  We discussed the potential increased risk of CV events with regular NSAID use.  I have asked him to reduce ibuprofen to as needed only.  Restart aspirin 81  mg daily.  Continue metoprolol tartrate, rosuvastatin.  Follow-up in 1 year. ? ?Essential hypertension ?The patient's blood pressure is controlled on his current regimen.  Continue current therapy.  ? ?Hyperlipidemia LDL goal <70 ?LDL optimal on current dose of rosuvastatin. ? 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  Heart rate is stable.  If he starts to have issues with bradycardia, we may need to cut back on his beta-blocker. ?  ?     ?   ?Dispo:  Return in about 1 year (around 02/14/2023) for Routine Follow Up, w/ Dr. Burt Knack, or Richardson Dopp, PA-C.  ? ?Medication Adjustments/Labs and Tests Ordered: ?Current medicines are reviewed at length with the patient today.  Concerns regarding medicines are outlined above.  ?Tests Ordered: ?Orders Placed This Encounter  ?Procedures  ? EKG 12-Lead  ? ?Medication Changes: ?Meds ordered this encounter  ?Medications  ? aspirin EC 81 MG tablet  ?  Sig: Take 1 tablet (81 mg total) by mouth daily. Swallow whole.  ?  Dispense:  90 tablet  ?  Refill:  3  ? ibuprofen (ADVIL) 200 MG tablet  ?  Sig: Take 1 tablet (200 mg total) by mouth every 6 (six) hours as needed for headache.  ?  Dispense:  30 tablet  ?  Refill:  0  ? ?Signed, ?Richardson Dopp, PA-C  ?02/13/2022 10:08 AM    ?[facility] ?[address], [place], [place]  [address] ?Phone: [phone]; Fax: [phone]  ?

## 2022-02-13 ENCOUNTER — Ambulatory Visit: Payer: PPO | Admitting: Physician Assistant

## 2022-02-13 ENCOUNTER — Encounter: Payer: Self-pay | Admitting: Physician Assistant

## 2022-02-13 VITALS — BP 130/80 | HR 63 | Ht 72.0 in | Wt 247.4 lb

## 2022-02-13 DIAGNOSIS — I440 Atrioventricular block, first degree: Secondary | ICD-10-CM | POA: Diagnosis not present

## 2022-02-13 DIAGNOSIS — I2510 Atherosclerotic heart disease of native coronary artery without angina pectoris: Secondary | ICD-10-CM

## 2022-02-13 DIAGNOSIS — I10 Essential (primary) hypertension: Secondary | ICD-10-CM | POA: Diagnosis not present

## 2022-02-13 DIAGNOSIS — E785 Hyperlipidemia, unspecified: Secondary | ICD-10-CM

## 2022-02-13 MED ORDER — IBUPROFEN 200 MG PO TABS: 200.0000 mg | ORAL_TABLET | Freq: Four times a day (QID) | ORAL | 0 refills | Status: DC | PRN

## 2022-02-13 MED ORDER — ASPIRIN EC 81 MG PO TBEC: 81.0000 mg | DELAYED_RELEASE_TABLET | Freq: Every day | ORAL | 3 refills | Status: AC

## 2022-02-13 NOTE — Assessment & Plan Note (Signed)
Chronic.  Heart rate is stable.  If he starts to have issues with bradycardia, we may need to cut back on his beta-blocker. ?

## 2022-02-13 NOTE — Assessment & Plan Note (Signed)
LDL optimal on current dose of rosuvastatin. ?

## 2022-02-13 NOTE — Assessment & Plan Note (Addendum)
S/p Inf MI in 2000 tx w stent x 3 to RCA and POBA w cutting balloon in 2001.  ETT in 2013 was low risk.  He is doing well without anginal symptoms.  He stopped aspirin for unclear reasons a little over a year ago.  We discussed the importance of antiplatelet therapy for secondary prevention.  He has been taking ibuprofen on a regular basis.  We discussed the potential increased risk of CV events with regular NSAID use.  I have asked him to reduce ibuprofen to as needed only.  Restart aspirin 81 mg daily.  Continue metoprolol tartrate, rosuvastatin.  Follow-up in 1 year. ?

## 2022-02-13 NOTE — Assessment & Plan Note (Signed)
The patient's blood pressure is controlled on his current regimen.  Continue current therapy.  

## 2022-02-13 NOTE — Patient Instructions (Addendum)
Medication Instructions:  ?Your physician has recommended you make the following change in your medication:  ? RESTART Aspirin 81 mg daily ? REDUCE the Ibuprofen to only as needed ? ? ?*If you need a refill on your cardiac medications before your next appointment, please call your pharmacy* ? ? ?Lab Work: ?None ordered ? ?If you have labs (blood work) drawn today and your tests are completely normal, you will receive your results only by: ?MyChart Message (if you have MyChart) OR ?A paper copy in the mail ?If you have any lab test that is abnormal or we need to change your treatment, we will call you to review the results. ? ? ?Testing/Procedures: ?None ordered ? ? ?Follow-Up: ?At [facility], you and your health needs are our priority.  As part of our continuing mission to provide you with exceptional heart care, we have created designated Provider Care Teams.  These Care Teams include your primary Cardiologist (physician) and Advanced Practice Providers (APPs -  Physician Assistants and Nurse Practitioners) who all work together to provide you with the care you need, when you need it. ? ?We recommend signing up for the patient portal called "MyChart".  Sign up information is provided on this After Visit Summary.  MyChart is used to connect with patients for Virtual Visits (Telemedicine).  Patients are able to view lab/test results, encounter notes, upcoming appointments, etc.  Non-urgent messages can be sent to your provider as well.   ?To learn more about what you can do with MyChart, go to [email or web address].   ? ?Your next appointment:   ?12 month(s) ? ?The format for your next appointment:   ?In Person ? ?Provider:   ?[name], MD   ?Other Instructions ? ? ?Important Information About Sugar ? ? ? ? ?  ?

## 2022-02-14 ENCOUNTER — Other Ambulatory Visit: Payer: Self-pay | Admitting: Internal Medicine

## 2022-02-20 DIAGNOSIS — D2371 Other benign neoplasm of skin of right lower limb, including hip: Secondary | ICD-10-CM | POA: Diagnosis not present

## 2022-02-20 DIAGNOSIS — Z85828 Personal history of other malignant neoplasm of skin: Secondary | ICD-10-CM | POA: Diagnosis not present

## 2022-02-20 DIAGNOSIS — D2261 Melanocytic nevi of right upper limb, including shoulder: Secondary | ICD-10-CM | POA: Diagnosis not present

## 2022-02-20 DIAGNOSIS — L570 Actinic keratosis: Secondary | ICD-10-CM | POA: Diagnosis not present

## 2022-02-26 ENCOUNTER — Telehealth: Payer: Self-pay | Admitting: Internal Medicine

## 2022-02-26 NOTE — Telephone Encounter (Signed)
1.Medication Requested: ?Tamsulosin .4 MG ?2. Pharmacy (Name, [place]): ?[id number] - [place], [address] Phone:  [phone]  ?Fax:  [phone]  ?  ? ?3. On Med List: no  ? ?Pt states that it was prescribed by a urologist. States when he called over to their office, they advised him that he needs another appt before that can be refilled. Pt states "it is a waste  of time because everything is fine" Pt was then advised by me that he would need to go based on the advise of the prescribing doctor.  ? ?Pt still wanted me to ask MD to see if she could refill it for emptying of his bladder. If not, pt states he will go back to the urologist for another exam.  ? ?Agent: Please be advised that RX refills may take up to 3 business days. We ask that you follow-up with your pharmacy.  ?

## 2022-02-27 MED ORDER — TAMSULOSIN HCL 0.4 MG PO CAPS: 0.4000 mg | ORAL_CAPSULE | Freq: Every day | ORAL | 1 refills | Status: AC

## 2022-02-27 NOTE — Telephone Encounter (Signed)
Prescription sent to pharmacy.

## 2022-03-01 DIAGNOSIS — N401 Enlarged prostate with lower urinary tract symptoms: Secondary | ICD-10-CM | POA: Diagnosis not present

## 2022-03-01 DIAGNOSIS — R351 Nocturia: Secondary | ICD-10-CM | POA: Diagnosis not present

## 2022-03-31 ENCOUNTER — Other Ambulatory Visit: Payer: Self-pay | Admitting: Internal Medicine

## 2022-04-02 ENCOUNTER — Other Ambulatory Visit: Payer: Self-pay | Admitting: Cardiovascular Disease

## 2022-05-08 ENCOUNTER — Other Ambulatory Visit: Payer: Self-pay | Admitting: Internal Medicine

## 2022-05-15 ENCOUNTER — Ambulatory Visit (INDEPENDENT_AMBULATORY_CARE_PROVIDER_SITE_OTHER): Payer: PPO

## 2022-05-15 DIAGNOSIS — Z0000 Encounter for general adult medical examination without abnormal findings: Secondary | ICD-10-CM

## 2022-05-15 NOTE — Patient Instructions (Signed)
[name] , Thank you for taking time to come for your Medicare Wellness Visit. I appreciate your ongoing commitment to your health goals. Please review the following plan we discussed and let me know if I can assist you in the future.   Screening recommendations/referrals: Colonoscopy: No longer recommended due to age Recommended yearly ophthalmology/optometry visit for glaucoma screening and checkup Recommended yearly dental visit for hygiene and checkup  Vaccinations: Influenza vaccine: 06/2021 Pneumococcal vaccine: 10/19/2010, 07/19/2015, 04/02/2022 Tdap vaccine: 04/02/2022; due every 10 years Shingles vaccine: 07/06/2017, 11/22/2017   Covid-19: 10/28/2019, 11/16/2019, 07/17/2020, 01/27/2021, 04/02/2022  Advanced directives: Yes  Conditions/risks identified: Yes  Next appointment: Please schedule your next Medicare Wellness Visit with your Nurse Health Advisor in 1 year by calling [phone].  Preventive Care 32 Years and Older, Male Preventive care refers to lifestyle choices and visits with your health care provider that can promote health and wellness. What does preventive care include? A yearly physical exam. This is also called an annual well check. Dental exams once or twice a year. Routine eye exams. Ask your health care provider how often you should have your eyes checked. Personal lifestyle choices, including: Daily care of your teeth and gums. Regular physical activity. Eating a healthy diet. Avoiding tobacco and drug use. Limiting alcohol use. Practicing safe sex. Taking low doses of aspirin every day. Taking vitamin and mineral supplements as recommended by your health care provider. What happens during an annual well check? The services and screenings done by your health care provider during your annual well check will depend on your age, overall health, lifestyle risk factors, and family history of disease. Counseling  Your health care provider may ask you questions about  your: Alcohol use. Tobacco use. Drug use. Emotional well-being. Home and relationship well-being. Sexual activity. Eating habits. History of falls. Memory and ability to understand (cognition). Work and work Statistician. Screening  You may have the following tests or measurements: Height, weight, and BMI. Blood pressure. Lipid and cholesterol levels. These may be checked every 5 years, or more frequently if you are over 27 years old. Skin check. Lung cancer screening. You may have this screening every year starting at age 75 if you have a 30-pack-year history of smoking and currently smoke or have quit within the past 15 years. Fecal occult blood test (FOBT) of the stool. You may have this test every year starting at age 1. Flexible sigmoidoscopy or colonoscopy. You may have a sigmoidoscopy every 5 years or a colonoscopy every 10 years starting at age 85. Prostate cancer screening. Recommendations will vary depending on your family history and other risks. Hepatitis C blood test. Hepatitis B blood test. Sexually transmitted disease (STD) testing. Diabetes screening. This is done by checking your blood sugar (glucose) after you have not eaten for a while (fasting). You may have this done every 1-3 years. Abdominal aortic aneurysm (AAA) screening. You may need this if you are a current or former smoker. Osteoporosis. You may be screened starting at age 82 if you are at high risk. Talk with your health care provider about your test results, treatment options, and if necessary, the need for more tests. Vaccines  Your health care provider may recommend certain vaccines, such as: Influenza vaccine. This is recommended every year. Tetanus, diphtheria, and acellular pertussis (Tdap, Td) vaccine. You may need a Td booster every 10 years. Zoster vaccine. You may need this after age 35. Pneumococcal 13-valent conjugate (PCV13) vaccine. One dose is recommended after age 44.  Pneumococcal  polysaccharide (PPSV23) vaccine. One dose is recommended after age 52. Talk to your health care provider about which screenings and vaccines you need and how often you need them. This information is not intended to replace advice given to you by your health care provider. Make sure you discuss any questions you have with your health care provider. Document Released: 10/28/2015 Document Revised: 06/20/2016 Document Reviewed: 08/02/2015 Elsevier Interactive Patient Education  2017 [employer] Prevention in the Home Falls can cause injuries. They can happen to people of all ages. There are many things you can do to make your home safe and to help prevent falls. What can I do on the outside of my home? Regularly fix the edges of walkways and driveways and fix any cracks. Remove anything that might make you trip as you walk through a door, such as a raised step or threshold. Trim any bushes or trees on the path to your home. Use bright outdoor lighting. Clear any walking paths of anything that might make someone trip, such as rocks or tools. Regularly check to see if handrails are loose or broken. Make sure that both sides of any steps have handrails. Any raised decks and porches should have guardrails on the edges. Have any leaves, snow, or ice cleared regularly. Use sand or salt on walking paths during winter. Clean up any spills in your garage right away. This includes oil or grease spills. What can I do in the bathroom? Use night lights. Install grab bars by the toilet and in the tub and shower. Do not use towel bars as grab bars. Use non-skid mats or decals in the tub or shower. If you need to sit down in the shower, use a plastic, non-slip stool. Keep the floor dry. Clean up any water that spills on the floor as soon as it happens. Remove soap buildup in the tub or shower regularly. Attach bath mats securely with double-sided non-slip rug tape. Do not have throw rugs and other  things on the floor that can make you trip. What can I do in the bedroom? Use night lights. Make sure that you have a light by your bed that is easy to reach. Do not use any sheets or blankets that are too big for your bed. They should not hang down onto the floor. Have a firm chair that has side arms. You can use this for support while you get dressed. Do not have throw rugs and other things on the floor that can make you trip. What can I do in the kitchen? Clean up any spills right away. Avoid walking on wet floors. Keep items that you use a lot in easy-to-reach places. If you need to reach something above you, use a strong step stool that has a grab bar. Keep electrical cords out of the way. Do not use floor polish or wax that makes floors slippery. If you must use wax, use non-skid floor wax. Do not have throw rugs and other things on the floor that can make you trip. What can I do with my stairs? Do not leave any items on the stairs. Make sure that there are handrails on both sides of the stairs and use them. Fix handrails that are broken or loose. Make sure that handrails are as long as the stairways. Check any carpeting to make sure that it is firmly attached to the stairs. Fix any carpet that is loose or worn. Avoid having throw rugs at the  top or bottom of the stairs. If you do have throw rugs, attach them to the floor with carpet tape. Make sure that you have a light switch at the top of the stairs and the bottom of the stairs. If you do not have them, ask someone to add them for you. What else can I do to help prevent falls? Wear shoes that: Do not have high heels. Have rubber bottoms. Are comfortable and fit you well. Are closed at the toe. Do not wear sandals. If you use a stepladder: Make sure that it is fully opened. Do not climb a closed stepladder. Make sure that both sides of the stepladder are locked into place. Ask someone to hold it for you, if possible. Clearly  mark and make sure that you can see: Any grab bars or handrails. First and last steps. Where the edge of each step is. Use tools that help you move around (mobility aids) if they are needed. These include: Canes. Walkers. Scooters. Crutches. Turn on the lights when you go into a dark area. Replace any light bulbs as soon as they burn out. Set up your furniture so you have a clear path. Avoid moving your furniture around. If any of your floors are uneven, fix them. If there are any pets around you, be aware of where they are. Review your medicines with your doctor. Some medicines can make you feel dizzy. This can increase your chance of falling. Ask your doctor what other things that you can do to help prevent falls. This information is not intended to replace advice given to you by your health care provider. Make sure you discuss any questions you have with your health care provider. Document Released: 07/28/2009 Document Revised: 03/08/2016 Document Reviewed: 11/05/2014 Elsevier Interactive Patient Education  2017 [employer].

## 2022-05-15 NOTE — Progress Notes (Signed)
I connected with Gay Rape today by telephone and verified that I am speaking with the correct person using two identifiers. Location patient: home Location provider: work Persons participating in the virtual visit: patient, provider.   I discussed the limitations, risks, security and privacy concerns of performing an evaluation and management service by telephone and the availability of in person appointments. I also discussed with the patient that there may be a patient responsible charge related to this service. The patient expressed understanding and verbally consented to this telephonic visit.    Interactive audio and video telecommunications were attempted between this provider and patient, however failed, due to patient having technical difficulties OR patient did not have access to video capability.  We continued and completed visit with audio only.  Some vital signs may be absent or patient reported.   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  Review of Systems     Cardiac Risk Factors include: advanced age (>55mn, >>97women);dyslipidemia;hypertension;male gender;obesity (BMI >30kg/m2);diabetes mellitus;family history of premature cardiovascular disease     Objective:    There were no vitals filed for this visit. There is no height or weight on file to calculate BMI.     05/15/2022    3:55 PM 09/19/2015    9:42 AM  Advanced Directives  Does Patient Have a Medical Advance Directive? Yes Yes  Type of Advance Directive Living will;Healthcare Power of [place] Does patient want to make changes to medical advance directive? No - Patient declined   Copy of [place] Chart? No - copy requested Yes    Current Medications (verified) Outpatient Encounter Medications as of 05/15/2022  Medication  Sig   losartan (COZAAR) 100 MG tablet TAKE 1 TABLET(100 MG) BY MOUTH DAILY   aspirin EC 81 MG tablet Take 1 tablet (81 mg total) by mouth daily. Swallow whole.   augmented betamethasone dipropionate (DIPROLENE-AF) 0.05 % cream Apply 1 application topically as needed (iritation).    blood glucose meter kit and supplies KIT Dispense based on patient and insurance preference. Use up to four times daily as directed. (FOR E11.9).   hydrochlorothiazide (HYDRODIURIL) 25 MG tablet Take 1 tablet (25 mg total) by mouth daily.   ibuprofen (ADVIL) 200 MG tablet Take 1 tablet (200 mg total) by mouth every 6 (six) hours as needed for headache.   ibuprofen (ADVIL) 800 MG tablet TAKE 1 TABLET(800 MG) BY MOUTH DAILY AS NEEDED FOR MODERATE PAIN   ipratropium (ATROVENT) 0.06 % nasal spray Place 2 sprays into both nostrils 4 (four) times daily.   JARDIANCE 10 MG TABS tablet TAKE 1 TABLET(10 MG) BY MOUTH DAILY   metFORMIN (GLUCOPHAGE) 1000 MG tablet TAKE 1 TABLET(1000 MG) BY MOUTH TWICE DAILY WITH A MEAL   metoprolol tartrate (LOPRESSOR) 25 MG tablet TAKE 1 TABLET(25 MG) BY MOUTH TWICE DAILY   ONE TOUCH ULTRA TEST test strip TEST UP TO FOUR TIMES DAILY AS DIRECTED   OneTouch Delica Lancets 331SMISC TEST UP TO FOUR TIMES DAILY AS DIRECTED   rosuvastatin (CRESTOR) 40 MG tablet TAKE 1 TABLET(40 MG) BY MOUTH DAILY   tamsulosin (FLOMAX) 0.4 MG CAPS capsule Take 1 capsule (0.4 mg total) by mouth daily.   triamcinolone (KENALOG) 0.1 % Apply topically 2 (two) times daily.   Zinc 25 MG TABS Take by mouth.   No  facility-administered encounter medications on file as of 05/15/2022.    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 Initial MI @ 72   Diabetes Father    Stroke Father    COPD Mother    Diabetes Mother    Hypertension Brother        DM- CABG   Kidney failure Sister    Social History   Socioeconomic History   Marital status: Married    Spouse name: Not on file   Number of children: Not on file   Years of education: Not on file   Highest education level: Not on file  Occupational History   Occupation: retired Engineer, site: RETIRED  Tobacco Use   Smoking status: Former    Types: Cigarettes    Quit date: 10/15/1997    Years since quitting: 24.5   Smokeless tobacco: Never   Tobacco comments:    smoked 1961-1999, up to 1/2 ppd. Off 3-4 years 1961-1999. Cigar 3/week for 1 year  Substance and Sexual Activity   Alcohol use: Yes    Alcohol/week: 0.0 standard drinks of alcohol    Comment: socially  1-2/ day   Drug use: No   Sexual activity: Yes  Other Topics Concern   Not on file  Social History Narrative   Not on file   Social Determinants of Health   Financial Resource Strain: Low Risk  (05/15/2022)   Overall Financial Resource Strain (CARDIA)    Difficulty of Paying Living Expenses: Not hard at all  Food Insecurity: No Food Insecurity (05/15/2022)   Hunger Vital Sign    Worried About Running Out of Food in the Last Year: Never true    [place] in the Last Year: Never true  Transportation Needs: No Transportation Needs (05/15/2022)   PRAPARE - Hydrologist (Medical): No    Lack of Transportation (Non-Medical): No  Physical Activity: Sufficiently Active (05/15/2022)   Exercise Vital Sign    Days of Exercise per Week: 4 days    Minutes of  Exercise per Session: 60 min  Stress: No Stress Concern Present (05/15/2022)   [place]    Feeling of Stress : Not at all  Social Connections: [place] (05/15/2022)   Social Connection and Isolation Panel [NHANES]    Frequency of Communication with Friends and Family: More than three times a week    Frequency of Social Gatherings with Friends and Family: More than three times a week    Attends Religious Services: More than 4 times per year    Active Member of [employer] or Organizations: Yes    Attends Archivist Meetings: More than  4 times per year    Marital Status: Married    Tobacco Counseling Counseling given: Not Answered Tobacco comments: smoked 1961-1999, up to 1/2 ppd. Off 3-4 years 1961-1999. Cigar 3/week for 1 year   Clinical Intake:  Pre-visit preparation completed: Yes  Pain : No/denies pain     BMI - recorded: 33.55 (02/13/2022) Nutritional Status: BMI > 30  Obese Nutritional Risks: None Diabetes: Yes CBG done?: No Did pt. 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 yes  Interpreter Needed?: No  Information entered by :: [employer], LPN.   Activities of Daily Living    05/15/2022    4:12 PM 07/03/2021    8:40 AM  In your present state of health, do you have any difficulty performing the following activities:  Hearing? 0 0  Vision? 0 0  Difficulty concentrating or making decisions? 0 0  Walking or climbing stairs? 0 0  Dressing or bathing? 0 0  Doing errands, shopping? 0 0  Preparing Food and eating ? N   Using the Toilet? N   In the past six months, have you accidently leaked urine? N   Do you have problems with loss of bowel control? N    Managing your Medications? N   Managing your Finances? N   Housekeeping or managing your Housekeeping? N     Patient Care Team: [name], MD as PCP - General (Internal Medicine) [name], MD as PCP - Cardiology (Cardiology)  Indicate any recent Medical Services you may have received from other than [facility] providers in the past year (date may be approximate).     Assessment:   This is a routine wellness examination for [place].  Hearing/Vision screen Hearing Screening - Comments:: Patient denied any hearing difficulty.   No hearing aids.  Vision Screening - Comments:: Patient does wear readers.  Eye exam done by: [facility]    Dietary issues and exercise activities discussed: Current Exercise Habits: Structured exercise class;Home exercise routine, Type of exercise: walking;treadmill;stretching;strength training/weights, Time (Minutes): 60, Frequency (Times/Week): 4, Weekly Exercise (Minutes/Week): 240, Intensity: Moderate, Exercise limited by: None identified   Goals Addressed             This Visit's Progress    To stay alive.        Depression Screen    05/15/2022    4:09 PM 01/01/2022    8:26 AM 12/28/2019    9:32 AM 07/15/2018    8:15 AM 07/15/2018    8:14 AM 07/09/2017    1:09 PM 03/19/2016    8:51 AM  PHQ 2/9 Scores  PHQ - 2 Score 0 0 0 0 0 0 0  PHQ- 9 Score    0       Fall Risk    05/15/2022    4:12 PM 01/01/2022    8:26 AM 07/03/2021    8:40 AM 12/28/2019    9:31 AM 07/15/2018    8:14 AM  [place] in the past year? 0 0 0 0 No  Number falls in past yr: 0 0 0 0   Injury with Fall? 0 0 0 0   Risk for fall due to : No Fall Risks  No Fall Risks  Follow up Falls evaluation completed  Falls evaluation completed      FALL RISK PREVENTION PERTAINING TO THE HOME:  Any stairs in or around the home? No  If so, are there any without handrails? No  Home free of loose throw rugs in walkways, pet beds, electrical cords, etc? Yes  Adequate  lighting in your home to reduce risk of falls? Yes   ASSISTIVE DEVICES UTILIZED TO PREVENT FALLS:  Life alert? No  Use of a cane, walker or w/c? No  Grab bars in the bathroom? Yes  Shower chair or bench in shower? Yes  Elevated toilet seat or a handicapped toilet? Yes   TIMED UP AND GO:  Was the test performed? No .  Length of time to ambulate 10 feet: n/a sec.   Appearance of gait: Gait not evaluated during this visit.  Cognitive Function:        05/15/2022    4:14 PM  6CIT Screen  What Year? 0 points  What month? 0 points  What time? 0 points  Count back from 20 0 points  Months in reverse 0 points  Repeat phrase 0 points  Total Score 0 points    Immunizations Immunization History  Administered Date(s) Administered   Fluad Quad(high Dose 65+) 06/26/2020   Influenza Whole 09/13/2010, 07/18/2013   Influenza, High Dose Seasonal PF 07/06/2017, 06/18/2018, 06/03/2019   Influenza,inj,Quad PF,6+ Mos 07/06/2017   Influenza,inj,quad, With Preservative 10/15/2016   Influenza-Unspecified 07/10/2014, 07/12/2015, 07/19/2016, 07/06/2017, 01/29/2019, 06/15/2021   PFIZER(Purple Top)SARS-COV-2 Vaccination 10/28/2019, 11/16/2019, 07/17/2020, 01/27/2021   PNEUMOCOCCAL CONJUGATE-20 04/02/2022   Pfizer Covid-19 Vaccine Bivalent Booster 14yr & up 04/02/2022   Pneumococcal Conjugate-13 07/19/2015   Pneumococcal Polysaccharide-23 10/19/2010   Tdap 05/28/2011, 04/02/2022   Zoster Recombinat (Shingrix) 07/06/2017, 11/22/2017   Zoster, Live 10/01/2003    TDAP status: Up to date  Flu Vaccine status: Up to date  Pneumococcal vaccine status: Up to date  Covid-19 vaccine status: Completed vaccines  Qualifies for Shingles Vaccine? Yes   Zostavax completed Yes   Shingrix Completed?: Yes  Screening Tests Health Maintenance  Topic Date Due   FOOT EXAM  07/16/2019   OPHTHALMOLOGY EXAM  11/22/2020   INFLUENZA VACCINE  05/15/2022   HEMOGLOBIN A1C  07/04/2022   Diabetic kidney  evaluation - GFR measurement  01/02/2023   Diabetic kidney evaluation - Urine ACR  01/02/2023   TETANUS/TDAP  04/02/2032   Pneumonia Vaccine 81 Years old  Completed   COVID-19 Vaccine  Completed   Hepatitis C Screening  Completed   Zoster Vaccines- Shingrix  Completed   HPV VACCINES  Aged Out   COLONOSCOPY (Pts 45-469yrInsurance coverage will need to be confirmed)  Discontinued    Health Maintenance  Health Maintenance Due  Topic Date Due   FOOT EXAM  07/16/2019   OPHTHALMOLOGY EXAM  11/22/2020   INFLUENZA VACCINE  05/15/2022    Colorectal cancer screening: No longer required.   Lung Cancer Screening: (Low Dose CT Chest recommended if Age 79-80ears, 30 pack-year currently smoking OR have quit w/in 15years.) does not qualify.   Lung Cancer Screening Referral: no  Additional Screening:  Hepatitis C Screening: does qualify; Completed 06/27/2020  Vision Screening: Recommended annual ophthalmology exams for early detection of glaucoma and other disorders of the eye. Is the patient up to date with their annual eye exam?  Yes  Who is the provider or what is the name of the office in which the patient attends annual eye exams? [facility]  If pt is not established with a provider, would they like to be referred to a provider to establish care? No .   Dental Screening: Recommended annual dental exams for proper oral hygiene  Community Resource Referral / Chronic Care Management: CRR required this visit?  No   CCM required this visit?  No      Plan:     I have personally reviewed and noted the following in the patient's chart:   Medical and social history Use of alcohol, tobacco or illicit drugs  Current medications and supplements including opioid prescriptions. Patient is not currently taking opioid prescriptions. Functional ability and status Nutritional status Physical activity Advanced directives List of other physicians Hospitalizations, surgeries, and ER  visits in previous 12 months Vitals Screenings to include cognitive, depression, and falls Referrals and appointments  In addition, I have reviewed and discussed with patient certain preventive protocols, quality metrics, and best practice recommendations. A written personalized care plan for preventive services as well as general preventive health recommendations were provided to patient.     Sheral Flow, LPN   01/19/5829   Nurse Notes:  Patient is cogitatively intact. There were no vitals filed for this visit. There is no height or weight on file to calculate BMI. Patient stated that he has no issues with gait or balance; does not use any assistive devices.

## 2022-06-01 LAB — HM DIABETES EYE EXAM

## 2022-06-07 ENCOUNTER — Encounter: Payer: Self-pay | Admitting: Internal Medicine

## 2022-06-07 NOTE — Progress Notes (Signed)
Outside notes received. Information abstracted. Notes sent to scan.  

## 2022-07-01 ENCOUNTER — Encounter: Payer: Self-pay | Admitting: Internal Medicine

## 2022-07-01 NOTE — Progress Notes (Unsigned)
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   MRN: [id number]     HPI [name] is here for follow up of his chronic medical problems, including htn, hld, DM, CAD  He is active - gardening, golfing, walks on treadmill.   He feels well.    Medications and allergies reviewed with patient and updated if appropriate.  Current Outpatient Medications on File Prior to Visit  Medication Sig Dispense Refill   acetaminophen (TYLENOL 8 HOUR) 650 MG CR tablet Take 650 mg by mouth every 8 (eight) hours as needed for pain.     aspirin EC 81 MG tablet Take 1 tablet (81 mg total) by mouth daily. Swallow whole. 90 tablet 3   augmented betamethasone dipropionate (DIPROLENE-AF) 0.05 % cream Apply 1 application topically as needed (iritation).      blood glucose meter kit and supplies KIT Dispense based on patient and insurance preference. Use up to four times daily as directed. (FOR E11.9). 1 each 0   hydrochlorothiazide (HYDRODIURIL) 25 MG tablet Take 1 tablet (25 mg total) by mouth daily. 90 tablet 3   ipratropium (ATROVENT) 0.06 % nasal spray Place 2 sprays into both nostrils 4 (four) times daily. 15 mL 12   JARDIANCE 10 MG TABS tablet TAKE 1 TABLET(10 MG) BY MOUTH DAILY 30 tablet 5   losartan (COZAAR) 100 MG tablet TAKE 1 TABLET(100 MG) BY MOUTH DAILY 90 tablet 1   metFORMIN (GLUCOPHAGE) 1000 MG tablet TAKE 1 TABLET(1000 MG) BY MOUTH TWICE DAILY WITH A MEAL 180 tablet 1   metoprolol tartrate (LOPRESSOR) 25 MG tablet TAKE 1 TABLET(25 MG) BY MOUTH TWICE DAILY 180 tablet 1   ONE TOUCH ULTRA TEST test strip TEST UP TO FOUR TIMES DAILY AS DIRECTED 300 each 5   OneTouch Delica Lancets 29J MISC TEST UP TO FOUR TIMES DAILY AS DIRECTED 100 each 2   rosuvastatin (CRESTOR) 40 MG tablet TAKE 1 TABLET(40 MG) BY MOUTH DAILY 90 tablet 3   tamsulosin (FLOMAX) 0.4 MG CAPS capsule Take 1 capsule (0.4 mg total) by mouth daily. 90 capsule 1   triamcinolone (KENALOG) 0.1 % Apply  topically 2 (two) times daily.     Zinc 25 MG TABS Take by mouth.     No current facility-administered medications on file prior to visit.     Review of Systems  Constitutional:  Negative for fever.  Respiratory:  Negative for cough, shortness of breath and wheezing.   Cardiovascular:  Negative for chest pain, palpitations and leg swelling.  Neurological:  Negative for light-headedness and headaches.       Objective:   Vitals:   07/02/22 0813  BP: 134/60  Pulse: (!) 59  Temp: 98 F (36.7 C)  SpO2: 96%   BP Readings from Last 3 Encounters:  07/02/22 134/60  02/13/22 130/80  01/01/22 138/76   Wt Readings from Last 3 Encounters:  07/02/22 233 lb (105.7 kg)  02/13/22 247 lb 6.4 oz (112.2 kg)  01/01/22 247 lb (112 kg)   Body mass index is 31.6 kg/m.    Physical Exam Constitutional:      General: He is not in acute distress.    Appearance: Normal appearance. He is not ill-appearing.  HENT:     Head: Normocephalic and atraumatic.  Eyes:     Conjunctiva/sclera: Conjunctivae normal.  Cardiovascular:     Rate and Rhythm: Normal rate and regular rhythm.  Heart sounds: Normal heart sounds. No murmur heard. Pulmonary:     Effort: Pulmonary effort is normal. No respiratory distress.     Breath sounds: Normal breath sounds. No wheezing or rales.  Musculoskeletal:     Right lower leg: No edema.     Left lower leg: No edema.  Skin:    General: Skin is warm and dry.     Findings: No rash.  Neurological:     Mental Status: He is alert. Mental status is at baseline.  Psychiatric:        Mood and Affect: Mood normal.        Lab Results  Component Value Date   WBC 7.4 01/01/2022   HGB 16.6 01/01/2022   HCT 48.6 01/01/2022   PLT 188.0 01/01/2022   GLUCOSE 133 (H) 01/01/2022   CHOL 98 01/01/2022   TRIG 105.0 01/01/2022   HDL 33.60 (L) 01/01/2022   LDLCALC 43 01/01/2022   ALT 16 01/01/2022   AST 15 01/01/2022   NA 139 01/01/2022   K 4.3 01/01/2022   CL 102  01/01/2022   CREATININE 1.29 01/01/2022   BUN 25 (H) 01/01/2022   CO2 27 01/01/2022   TSH 2.88 04/03/2019   PSA 1.13 12/26/2020   INR 1.0 05/03/2008   HGBA1C 7.1 (H) 01/01/2022   MICROALBUR 1.3 01/01/2022     Assessment & Plan:    See Problem List for Assessment and Plan of chronic medical problems.

## 2022-07-01 NOTE — Patient Instructions (Addendum)
     Flu vaccine today   Blood work was ordered.     Medications changes include :   none    Return in about 6 months (around 12/31/2022) for Physical Exam.

## 2022-07-02 ENCOUNTER — Ambulatory Visit (INDEPENDENT_AMBULATORY_CARE_PROVIDER_SITE_OTHER): Payer: PPO | Admitting: Internal Medicine

## 2022-07-02 VITALS — BP 134/60 | HR 59 | Temp 98.0°F | Ht 72.0 in | Wt 233.0 lb

## 2022-07-02 DIAGNOSIS — Z23 Encounter for immunization: Secondary | ICD-10-CM | POA: Diagnosis not present

## 2022-07-02 DIAGNOSIS — E785 Hyperlipidemia, unspecified: Secondary | ICD-10-CM

## 2022-07-02 DIAGNOSIS — E1169 Type 2 diabetes mellitus with other specified complication: Secondary | ICD-10-CM | POA: Diagnosis not present

## 2022-07-02 DIAGNOSIS — I10 Essential (primary) hypertension: Secondary | ICD-10-CM

## 2022-07-02 DIAGNOSIS — I2510 Atherosclerotic heart disease of native coronary artery without angina pectoris: Secondary | ICD-10-CM

## 2022-07-02 LAB — LIPID PANEL
Cholesterol: 101 mg/dL (ref 0–200)
HDL: 34.4 mg/dL — ABNORMAL LOW (ref >39.00)
LDL Cholesterol: 47 mg/dL (ref 0–99)
NonHDL: 66.68
Total CHOL/HDL Ratio: 3
Triglycerides: 97 mg/dL (ref 0.0–149.0)
VLDL: 19.4 mg/dL (ref 0.0–40.0)

## 2022-07-02 LAB — COMPREHENSIVE METABOLIC PANEL
ALT: 18 U/L (ref 0–53)
AST: 16 U/L (ref 0–37)
Albumin: 4.5 g/dL (ref 3.5–5.2)
Alkaline Phosphatase: 51 U/L (ref 39–117)
BUN: 23 mg/dL (ref 6–23)
CO2: 27 mEq/L (ref 19–32)
Calcium: 9.9 mg/dL (ref 8.4–10.5)
Chloride: 102 mEq/L (ref 96–112)
Creatinine, Ser: 1.34 mg/dL (ref 0.40–1.50)
GFR: 50.62 mL/min — ABNORMAL LOW (ref >60.00)
Glucose, Bld: 135 mg/dL — ABNORMAL HIGH (ref 70–99)
Potassium: 4.7 mEq/L (ref 3.5–5.1)
Sodium: 139 mEq/L (ref 135–145)
Total Bilirubin: 1.2 mg/dL (ref 0.2–1.2)
Total Protein: 7.1 g/dL (ref 6.0–8.3)

## 2022-07-02 LAB — HEMOGLOBIN A1C: Hgb A1c MFr Bld: 6.7 % — ABNORMAL HIGH (ref 4.6–6.5)

## 2022-07-02 NOTE — Assessment & Plan Note (Signed)
Chronic Check lipid panel  Continue Crestor 40 mg daily Regular exercise and healthy diet encouraged  

## 2022-07-02 NOTE — Assessment & Plan Note (Signed)
Chronic Blood pressure well controlled CMP Continue HCTZ 25 mg daily, losartan 100 mg daily, metoprolol 25 mg bid

## 2022-07-02 NOTE — Addendum Note (Signed)
Addended by: Marcina Millard on: 07/02/2022 08:48 AM   Modules accepted: Orders

## 2022-07-02 NOTE — Assessment & Plan Note (Signed)
Chronic  Lab Results  Component Value Date   HGBA1C 7.1 (H) 01/01/2022   Sugars not ideally controlled Check A1c Continue jardiance 10 mg daily, metformin 1000 mg bid Stressed regular exercise, diabetic diet

## 2022-07-31 ENCOUNTER — Other Ambulatory Visit: Payer: Self-pay | Admitting: Internal Medicine

## 2022-10-08 ENCOUNTER — Other Ambulatory Visit: Payer: Self-pay | Admitting: Internal Medicine

## 2022-12-11 ENCOUNTER — Other Ambulatory Visit: Payer: Self-pay | Admitting: Internal Medicine

## 2023-01-06 ENCOUNTER — Encounter: Payer: Self-pay | Admitting: Internal Medicine

## 2023-01-06 NOTE — Progress Notes (Unsigned)
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   MRN: [id number]     HPI [name] is here for a physical exam and his chronic medical problems.  Doing well.  No concerns.   CKD - only takes tylenol.    Medications and allergies reviewed with patient and updated if appropriate.  Current Outpatient Medications on File Prior to Visit  Medication Sig Dispense Refill   acetaminophen (TYLENOL 8 HOUR) 650 MG CR tablet Take 650 mg by mouth every 8 (eight) hours as needed for pain.     aspirin EC 81 MG tablet Take 1 tablet (81 mg total) by mouth daily. Swallow whole. 90 tablet 3   augmented betamethasone dipropionate (DIPROLENE-AF) 0.05 % cream Apply 1 application topically as needed (iritation).      blood glucose meter kit and supplies KIT Dispense based on patient and insurance preference. Use up to four times daily as directed. (FOR E11.9). 1 each 0   co-enzyme Q-10 30 MG capsule Take by mouth.     hydrochlorothiazide (HYDRODIURIL) 25 MG tablet Take 1 tablet (25 mg total) by mouth daily. 90 tablet 3   ipratropium (ATROVENT) 0.06 % nasal spray Place 2 sprays into both nostrils 4 (four) times daily. 15 mL 12   JARDIANCE 10 MG TABS tablet TAKE 1 TABLET(10 MG) BY MOUTH DAILY 30 tablet 5   losartan (COZAAR) 100 MG tablet TAKE 1 TABLET(100 MG) BY MOUTH DAILY 90 tablet 1   metFORMIN (GLUCOPHAGE) 1000 MG tablet TAKE 1 TABLET(1000 MG) BY MOUTH TWICE DAILY WITH A MEAL 180 tablet 1   metoprolol tartrate (LOPRESSOR) 25 MG tablet TAKE 1 TABLET(25 MG) BY MOUTH TWICE DAILY 180 tablet 1   ONE TOUCH ULTRA TEST test strip TEST UP TO FOUR TIMES DAILY AS DIRECTED 300 each 5   OneTouch Delica Lancets 99991111 MISC TEST UP TO FOUR TIMES DAILY AS DIRECTED 100 each 2   rosuvastatin (CRESTOR) 40 MG tablet TAKE 1 TABLET(40 MG) BY MOUTH DAILY 90 tablet 3   tamsulosin (FLOMAX) 0.4 MG CAPS capsule Take 1 capsule (0.4 mg total) by mouth daily. 90 capsule 1   triamcinolone (KENALOG) 0.1 %  Apply topically 2 (two) times daily.     Zinc 25 MG TABS Take by mouth.     No current facility-administered medications on file prior to visit.    Review of Systems  Constitutional:  Negative for fever.  Eyes:  Negative for visual disturbance.  Respiratory:  Negative for cough, shortness of breath and wheezing.   Cardiovascular:  Negative for chest pain, palpitations and leg swelling.  Gastrointestinal:  Negative for abdominal pain, blood in stool, constipation and diarrhea.       No gerd  Genitourinary:  Negative for dysuria and hematuria.  Musculoskeletal:  Positive for back pain (stiffness). Negative for arthralgias.  Skin:  Negative for rash.  Neurological:  Negative for dizziness, light-headedness and headaches.  Psychiatric/Behavioral:  Negative for dysphoric mood. The patient is not nervous/anxious.        Objective:   Vitals:   01/07/23 0759  BP: 120/82  Pulse: (!) 59  Temp: 98.2 F (36.8 C)  SpO2: 98%   Filed Weights   01/07/23 0759  Weight: 248 lb (112.5 kg)   Body mass index is 33.63 kg/m.  BP Readings from Last 3 Encounters:  01/07/23 120/82  07/02/22 134/60  02/13/22 130/80    Wt Readings from Last 3 Encounters:  01/07/23 248 lb (112.5 kg)  07/02/22 233 lb (105.7 kg)  02/13/22 247 lb 6.4 oz (112.2 kg)      Physical Exam Constitutional: He appears well-developed and well-nourished. No distress.  HENT:  Head: Normocephalic and atraumatic.  Right Ear: External ear normal.  Left Ear: External ear normal.  Normal ear canals and TM b/l  Mouth/Throat: Oropharynx is clear and moist. Eyes: Conjunctivae and EOM are normal.  Neck: Neck supple. No tracheal deviation present. No thyromegaly present.  No carotid bruit  Cardiovascular: Normal rate, regular rhythm, normal heart sounds and intact distal pulses.   No murmur heard.  No lower extremity edema. Pulmonary/Chest: Effort normal and breath sounds normal. No respiratory distress. He has no wheezes.  He has no rales.  Abdominal: Soft. He exhibits no distension. There is no tenderness.  Genitourinary: deferred  Lymphadenopathy:   He has no cervical adenopathy.  Skin: Skin is warm and dry. He is not diaphoretic.  Psychiatric: He has a normal mood and affect. His behavior is normal.         Assessment & Plan:   Physical exam: Screening blood work  ordered Exercise  3-4 / week at gym, golf Weight  - obese Substance abuse   none   Reviewed recommended immunizations.   Health Maintenance  Topic Date Due   FOOT EXAM  07/16/2019   COVID-19 Vaccine (6 - 2023-24 season) 06/15/2022   Diabetic kidney evaluation - Urine ACR  01/02/2023   HEMOGLOBIN A1C  12/31/2022   Medicare Annual Wellness (AWV)  05/16/2023   OPHTHALMOLOGY EXAM  06/02/2023   Diabetic kidney evaluation - eGFR measurement  07/03/2023   DTaP/Tdap/Td (3 - Td or Tdap) 04/02/2032   Pneumonia Vaccine 12+ Years old  Completed   INFLUENZA VACCINE  Completed   Hepatitis C Screening  Completed   Zoster Vaccines- Shingrix  Completed   HPV VACCINES  Aged Out   COLONOSCOPY (Pts 45-78yrs Insurance coverage will need to be confirmed)  Discontinued     See Problem List for Assessment and Plan of chronic medical problems.

## 2023-01-06 NOTE — Patient Instructions (Addendum)
Blood work was ordered.   The lab is on the first floor.    Medications changes include :   None     Return in about 6 months (around 07/10/2023) for follow up.   Health Maintenance, Male Adopting a healthy lifestyle and getting preventive care are important in promoting health and wellness. Ask your health care provider about: The right schedule for you to have regular tests and exams. Things you can do on your own to prevent diseases and keep yourself healthy. What should I know about diet, weight, and exercise? Eat a healthy diet  Eat a diet that includes plenty of vegetables, fruits, low-fat dairy products, and lean protein. Do not eat a lot of foods that are high in solid fats, added sugars, or sodium. Maintain a healthy weight Body mass index (BMI) is a measurement that can be used to identify possible weight problems. It estimates body fat based on height and weight. Your health care provider can help determine your BMI and help you achieve or maintain a healthy weight. Get regular exercise Get regular exercise. This is one of the most important things you can do for your health. Most adults should: Exercise for at least 150 minutes each week. The exercise should increase your heart rate and make you sweat (moderate-intensity exercise). Do strengthening exercises at least twice a week. This is in addition to the moderate-intensity exercise. Spend less time sitting. Even light physical activity can be beneficial. Watch cholesterol and blood lipids Have your blood tested for lipids and cholesterol at 80 years of age, then have this test every 5 years. You may need to have your cholesterol levels checked more often if: Your lipid or cholesterol levels are high. You are older than 80 years of age. You are at high risk for heart disease. What should I know about cancer screening? Many types of cancers can be detected early and may often be prevented. Depending on your  health history and family history, you may need to have cancer screening at various ages. This may include screening for: Colorectal cancer. Prostate cancer. Skin cancer. Lung cancer. What should I know about heart disease, diabetes, and high blood pressure? Blood pressure and heart disease High blood pressure causes heart disease and increases the risk of stroke. This is more likely to develop in people who have high blood pressure readings or are overweight. Talk with your health care provider about your target blood pressure readings. Have your blood pressure checked: Every 3-5 years if you are 25-12 years of age. Every year if you are 86 years old or older. If you are between the ages of 80 and 37 and are a current or former smoker, ask your health care provider if you should have a one-time screening for abdominal aortic aneurysm (AAA). Diabetes Have regular diabetes screenings. This checks your fasting blood sugar level. Have the screening done: Once every three years after age 72 if you are at a normal weight and have a low risk for diabetes. More often and at a younger age if you are overweight or have a high risk for diabetes. What should I know about preventing infection? Hepatitis B If you have a higher risk for hepatitis B, you should be screened for this virus. Talk with your health care provider to find out if you are at risk for hepatitis B infection. Hepatitis C Blood testing is recommended for: Everyone born from [birth year] through 1963. Anyone with known risk  factors for hepatitis C. Sexually transmitted infections (STIs) You should be screened each year for STIs, including gonorrhea and chlamydia, if: You are sexually active and are younger than 80 years of age. You are older than 80 years of age and your health care provider tells you that you are at risk for this type of infection. Your sexual activity has changed since you were last screened, and you are at increased risk  for chlamydia or gonorrhea. Ask your health care provider if you are at risk. Ask your health care provider about whether you are at high risk for HIV. Your health care provider may recommend a prescription medicine to help prevent HIV infection. If you choose to take medicine to prevent HIV, you should first get tested for HIV. You should then be tested every 3 months for as long as you are taking the medicine. Follow these instructions at home: Alcohol use Do not drink alcohol if your health care provider tells you not to drink. If you drink alcohol: Limit how much you have to 0-2 drinks a day. Know how much alcohol is in your drink. In the [place] one drink equals one 12 oz bottle of beer (355 mL), one 5 oz glass of wine (148 mL), or one 1 oz glass of hard liquor (44 mL). Lifestyle Do not use any products that contain nicotine or tobacco. These products include cigarettes, chewing tobacco, and vaping devices, such as e-cigarettes. If you need help quitting, ask your health care provider. Do not use street drugs. Do not share needles. Ask your health care provider for help if you need support or information about quitting drugs. General instructions Schedule regular health, dental, and eye exams. Stay current with your vaccines. Tell your health care provider if: You often feel depressed. You have ever been abused or do not feel safe at home. Summary Adopting a healthy lifestyle and getting preventive care are important in promoting health and wellness. Follow your health care provider's instructions about healthy diet, exercising, and getting tested or screened for diseases. Follow your health care provider's instructions on monitoring your cholesterol and blood pressure. This information is not intended to replace advice given to you by your health care provider. Make sure you discuss any questions you have with your health care provider. Document Revised: 02/20/2021 Document Reviewed:  02/20/2021 Elsevier Patient Education  [place].

## 2023-01-07 ENCOUNTER — Ambulatory Visit (INDEPENDENT_AMBULATORY_CARE_PROVIDER_SITE_OTHER): Payer: PPO | Admitting: Internal Medicine

## 2023-01-07 ENCOUNTER — Telehealth: Payer: Self-pay

## 2023-01-07 VITALS — BP 120/82 | HR 59 | Temp 98.2°F | Ht 72.0 in | Wt 248.0 lb

## 2023-01-07 DIAGNOSIS — N1831 Chronic kidney disease, stage 3a: Secondary | ICD-10-CM | POA: Diagnosis not present

## 2023-01-07 DIAGNOSIS — E785 Hyperlipidemia, unspecified: Secondary | ICD-10-CM

## 2023-01-07 DIAGNOSIS — I2510 Atherosclerotic heart disease of native coronary artery without angina pectoris: Secondary | ICD-10-CM

## 2023-01-07 DIAGNOSIS — N401 Enlarged prostate with lower urinary tract symptoms: Secondary | ICD-10-CM

## 2023-01-07 DIAGNOSIS — E782 Mixed hyperlipidemia: Secondary | ICD-10-CM

## 2023-01-07 DIAGNOSIS — D751 Secondary polycythemia: Secondary | ICD-10-CM | POA: Diagnosis not present

## 2023-01-07 DIAGNOSIS — I10 Essential (primary) hypertension: Secondary | ICD-10-CM | POA: Diagnosis not present

## 2023-01-07 DIAGNOSIS — R350 Frequency of micturition: Secondary | ICD-10-CM

## 2023-01-07 DIAGNOSIS — N183 Chronic kidney disease, stage 3 unspecified: Secondary | ICD-10-CM | POA: Insufficient documentation

## 2023-01-07 DIAGNOSIS — E1169 Type 2 diabetes mellitus with other specified complication: Secondary | ICD-10-CM | POA: Diagnosis not present

## 2023-01-07 DIAGNOSIS — Z0000 Encounter for general adult medical examination without abnormal findings: Secondary | ICD-10-CM | POA: Diagnosis not present

## 2023-01-07 DIAGNOSIS — Z125 Encounter for screening for malignant neoplasm of prostate: Secondary | ICD-10-CM

## 2023-01-07 LAB — CBC WITH DIFFERENTIAL/PLATELET
Basophils Absolute: 0 10*3/uL (ref 0.0–0.1)
Basophils Relative: 0.5 % (ref 0.0–3.0)
Eosinophils Absolute: 0.2 10*3/uL (ref 0.0–0.7)
Eosinophils Relative: 2.2 % (ref 0.0–5.0)
HCT: 53.8 % — ABNORMAL HIGH (ref 39.0–52.0)
Hemoglobin: 18.5 g/dL (ref 13.0–17.0)
Lymphocytes Relative: 16.7 % (ref 12.0–46.0)
Lymphs Abs: 1.3 10*3/uL (ref 0.7–4.0)
MCHC: 34.5 g/dL (ref 30.0–36.0)
MCV: 96 fl (ref 78.0–100.0)
Monocytes Absolute: 0.8 10*3/uL (ref 0.1–1.0)
Monocytes Relative: 9.6 % (ref 3.0–12.0)
Neutro Abs: 5.7 10*3/uL (ref 1.4–7.7)
Neutrophils Relative %: 71 % (ref 43.0–77.0)
Platelets: 182 10*3/uL (ref 150.0–400.0)
RBC: 5.6 Mil/uL (ref 4.22–5.81)
RDW: 13.5 % (ref 11.5–15.5)
WBC: 8.1 10*3/uL (ref 4.0–10.5)

## 2023-01-07 LAB — LIPID PANEL
Cholesterol: 118 mg/dL (ref 0–200)
HDL: 38.7 mg/dL — ABNORMAL LOW (ref >39.00)
LDL Cholesterol: 54 mg/dL (ref 0–99)
NonHDL: 79.25
Total CHOL/HDL Ratio: 3
Triglycerides: 128 mg/dL (ref 0.0–149.0)
VLDL: 25.6 mg/dL (ref 0.0–40.0)

## 2023-01-07 LAB — COMPREHENSIVE METABOLIC PANEL
ALT: 27 U/L (ref 0–53)
AST: 19 U/L (ref 0–37)
Albumin: 4.8 g/dL (ref 3.5–5.2)
Alkaline Phosphatase: 48 U/L (ref 39–117)
BUN: 17 mg/dL (ref 6–23)
CO2: 28 mEq/L (ref 19–32)
Calcium: 9.9 mg/dL (ref 8.4–10.5)
Chloride: 103 mEq/L (ref 96–112)
Creatinine, Ser: 1.18 mg/dL (ref 0.40–1.50)
GFR: 58.75 mL/min — ABNORMAL LOW (ref >60.00)
Glucose, Bld: 171 mg/dL — ABNORMAL HIGH (ref 70–99)
Potassium: 4.9 mEq/L (ref 3.5–5.1)
Sodium: 141 mEq/L (ref 135–145)
Total Bilirubin: 1.1 mg/dL (ref 0.2–1.2)
Total Protein: 6.8 g/dL (ref 6.0–8.3)

## 2023-01-07 LAB — HEMOGLOBIN A1C: Hgb A1c MFr Bld: 7.2 % — ABNORMAL HIGH (ref 4.6–6.5)

## 2023-01-07 LAB — MICROALBUMIN / CREATININE URINE RATIO
Creatinine,U: 79.1 mg/dL
Microalb Creat Ratio: 2.5 mg/g (ref 0.0–30.0)
Microalb, Ur: 2 mg/dL — ABNORMAL HIGH (ref 0.0–1.9)

## 2023-01-07 MED ORDER — EMPAGLIFLOZIN 10 MG PO TABS: ORAL_TABLET | ORAL | 1 refills | Status: DC

## 2023-01-07 MED ORDER — METOPROLOL TARTRATE 25 MG PO TABS: ORAL_TABLET | ORAL | 1 refills | Status: AC

## 2023-01-07 MED ORDER — LOSARTAN POTASSIUM 100 MG PO TABS: ORAL_TABLET | ORAL | 1 refills | Status: DC

## 2023-01-07 NOTE — Assessment & Plan Note (Addendum)
Chronic   Lab Results  Component Value Date   HGBA1C 6.7 (H) 07/02/2022   Sugars controlled Check A1c, urine microalbumin Continue jardiance 10 mg daily, metformin 1000 mg bid Stressed regular exercise, diabetic diet

## 2023-01-07 NOTE — Assessment & Plan Note (Signed)
Chronic Check lipid panel  Continue Crestor 40 mg daily Regular exercise and healthy diet encouraged  

## 2023-01-07 NOTE — Assessment & Plan Note (Signed)
Chronic GFR decreased consistently over the past year and a half Discussed importance of keeping sugar, blood pressure well-controlled Healthy diet, weight loss advised Avoid NSAIDs Increase water intake

## 2023-01-07 NOTE — Assessment & Plan Note (Signed)
Chronic Follow-up with cardiology History of stent No symptoms consistent with angina Rosuvastatin 40 mg daily, ASA 81 mg, metoprolol 25 mg twice daily, Jardiance 10 mg daily

## 2023-01-07 NOTE — Assessment & Plan Note (Signed)
Chronic Sees urology  Continue tamsulosin 0.4 mg daily

## 2023-01-07 NOTE — Assessment & Plan Note (Signed)
Chronic Blood pressure well controlled CMP Continue HCTZ 25 mg daily, losartan 100 mg daily, metoprolol 25 mg bid 

## 2023-01-08 ENCOUNTER — Other Ambulatory Visit: Payer: Self-pay | Admitting: Internal Medicine

## 2023-01-08 LAB — TSH: TSH: 2.61 u[IU]/mL (ref 0.35–5.50)

## 2023-01-08 LAB — PSA, MEDICARE: PSA: 1.06 ng/ml (ref 0.10–4.00)

## 2023-01-08 MED ORDER — EMPAGLIFLOZIN 25 MG PO TABS: 25.0000 mg | ORAL_TABLET | Freq: Every day | ORAL | 1 refills | Status: DC

## 2023-01-08 NOTE — Addendum Note (Signed)
Addended by: [name] on: 01/08/2023 11:58 AM   Modules accepted: Orders

## 2023-01-08 NOTE — Telephone Encounter (Signed)
Info sent to Dr. Quay Burow

## 2023-01-09 ENCOUNTER — Other Ambulatory Visit (INDEPENDENT_AMBULATORY_CARE_PROVIDER_SITE_OTHER): Payer: PPO

## 2023-01-09 DIAGNOSIS — D751 Secondary polycythemia: Secondary | ICD-10-CM

## 2023-01-09 LAB — CBC WITH DIFFERENTIAL/PLATELET
Basophils Absolute: 0.1 10*3/uL (ref 0.0–0.1)
Basophils Relative: 0.6 % (ref 0.0–3.0)
Eosinophils Absolute: 0.2 10*3/uL (ref 0.0–0.7)
Eosinophils Relative: 2.8 % (ref 0.0–5.0)
HCT: 52.9 % — ABNORMAL HIGH (ref 39.0–52.0)
Hemoglobin: 17.9 g/dL — ABNORMAL HIGH (ref 13.0–17.0)
Lymphocytes Relative: 18.2 % (ref 12.0–46.0)
Lymphs Abs: 1.5 10*3/uL (ref 0.7–4.0)
MCHC: 33.8 g/dL (ref 30.0–36.0)
MCV: 96.1 fl (ref 78.0–100.0)
Monocytes Absolute: 0.7 10*3/uL (ref 0.1–1.0)
Monocytes Relative: 8.6 % (ref 3.0–12.0)
Neutro Abs: 5.8 10*3/uL (ref 1.4–7.7)
Neutrophils Relative %: 69.8 % (ref 43.0–77.0)
Platelets: 178 10*3/uL (ref 150.0–400.0)
RBC: 5.5 Mil/uL (ref 4.22–5.81)
RDW: 13.3 % (ref 11.5–15.5)
WBC: 8.3 10*3/uL (ref 4.0–10.5)

## 2023-01-10 ENCOUNTER — Other Ambulatory Visit: Payer: PPO

## 2023-01-31 ENCOUNTER — Other Ambulatory Visit: Payer: Self-pay | Admitting: *Deleted

## 2023-01-31 DIAGNOSIS — D751 Secondary polycythemia: Secondary | ICD-10-CM

## 2023-02-05 NOTE — Progress Notes (Unsigned)
New Hematology/Oncology Consult   Requesting MD: Dr. Cheryll Cockayne  [phone]      Reason for Consult: Polycythemia  HPI: [name] is a 82 year old man referred for evaluation of erythrocytosis.  He saw Dr. Lawerance Bach for routine follow-up 01/07/2023.  CBC returned with hemoglobin 18.5, hematocrit 53, white count 8.1, platelet count 182,000.  Repeat CBC 01/09/2023 showed hemoglobin 17.9, hematocrit 52.9, white count 8.3, platelet count 178,000.  Comparison CBC 01/01/2022-hemoglobin 16.6/hematocrit 48.6; 06/27/2020 hemoglobin 16/hematocrit 16.5; 04/03/2019 hemoglobin 17.9/hematocrit 52.7; 01/06/2018 hemoglobin 17.3/hematocrit 50.2.     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 Swallow whole., Disp: 90 tablet, Rfl: 3   augmented betamethasone dipropionate (DIPROLENE-AF) 0.05 % cream, Apply 1 application topically as needed (iritation). , Disp: , Rfl:    blood glucose meter kit and supplies KIT, Dispense based on patient and insurance preference. Use up to four times daily as directed. (FOR E11.9)., Disp: 1 each, Rfl: 0   co-enzyme Q-10 30 MG capsule, Take by mouth., Disp: , Rfl:    empagliflozin (JARDIANCE) 25 MG TABS tablet, Take 1 tablet (25 mg total) by mouth daily before breakfast., Disp:  90 tablet, Rfl: 1   hydrochlorothiazide (HYDRODIURIL) 25 MG tablet, Take 1 tablet (25 mg total) by mouth daily., Disp: 90 tablet, Rfl: 3   ipratropium (ATROVENT) 0.06 % nasal spray, Place 2 sprays into both nostrils 4 (four) times daily., Disp: 15 mL, Rfl: 12   losartan (COZAAR) 100 MG tablet, TAKE 1 TABLET(100 MG) BY MOUTH DAILY, Disp: 90 tablet, Rfl: 1   metFORMIN (GLUCOPHAGE) 1000 MG tablet, TAKE 1 TABLET(1000 MG) BY MOUTH TWICE DAILY WITH A MEAL, Disp: 180 tablet, Rfl: 1   metoprolol tartrate (LOPRESSOR) 25 MG tablet, TAKE 1 TABLET(25 MG) BY MOUTH TWICE DAILY, Disp: 180 tablet, Rfl: 1   ONE TOUCH ULTRA TEST test strip, TEST UP TO FOUR TIMES DAILY AS DIRECTED, Disp: 300 each, Rfl: 5   OneTouch Delica Lancets 33G MISC, TEST UP TO FOUR TIMES DAILY AS DIRECTED, Disp: 100 each, Rfl: 2   rosuvastatin (CRESTOR) 40 MG tablet, TAKE 1 TABLET(40 MG) BY MOUTH DAILY, Disp: 90 tablet, Rfl: 3   tamsulosin (FLOMAX) 0.4 MG CAPS capsule, Take 1 capsule (0.4 mg total) by mouth daily., Disp: 90 capsule, Rfl: 1   triamcinolone (KENALOG) 0.1 %, Apply topically 2 (two) times daily., Disp: , Rfl:    Zinc 25 MG TABS, Take by mouth., Disp: , Rfl: :     Allergies  Allergen Reactions   Ramipril Cough    12/31/12 COUGH    FH:  SOCIAL HISTORY:  Review of Systems:  Physical Exam:  There were no vitals taken for this visit.  HEENT: *** Lungs: *** Cardiac: *** Abdomen: *** GU: ***  Vascular: *** Lymph nodes: *** Neurologic: *** Skin: *** Musculoskeletal: ***  LABS:  No results for input(s): "WBC", "HGB", "HCT", "PLT" in the last 72 hours.  No results for input(s): "NA", "K", "CL", "CO2", "GLUCOSE", "BUN", "CREATININE", "CALCIUM" in the last 72 hours.    RADIOLOGY:  No results found.  Assessment and Plan:   ***    Lonna Cobb, NP 02/05/2023, 2:40 PM

## 2023-02-06 ENCOUNTER — Inpatient Hospital Stay: Payer: PPO | Attending: Nurse Practitioner | Admitting: Nurse Practitioner

## 2023-02-06 ENCOUNTER — Encounter: Payer: Self-pay | Admitting: Nurse Practitioner

## 2023-02-06 ENCOUNTER — Inpatient Hospital Stay: Payer: PPO

## 2023-02-06 VITALS — BP 139/80 | HR 60 | Temp 98.2°F | Resp 18 | Ht 72.0 in | Wt 247.0 lb

## 2023-02-06 DIAGNOSIS — D582 Other hemoglobinopathies: Secondary | ICD-10-CM | POA: Diagnosis not present

## 2023-02-06 DIAGNOSIS — I10 Essential (primary) hypertension: Secondary | ICD-10-CM | POA: Diagnosis not present

## 2023-02-06 DIAGNOSIS — I252 Old myocardial infarction: Secondary | ICD-10-CM | POA: Insufficient documentation

## 2023-02-06 DIAGNOSIS — D751 Secondary polycythemia: Secondary | ICD-10-CM | POA: Diagnosis not present

## 2023-02-06 DIAGNOSIS — E119 Type 2 diabetes mellitus without complications: Secondary | ICD-10-CM | POA: Insufficient documentation

## 2023-02-06 DIAGNOSIS — N400 Enlarged prostate without lower urinary tract symptoms: Secondary | ICD-10-CM | POA: Diagnosis not present

## 2023-02-06 DIAGNOSIS — F1090 Alcohol use, unspecified, uncomplicated: Secondary | ICD-10-CM | POA: Diagnosis not present

## 2023-02-06 DIAGNOSIS — I2510 Atherosclerotic heart disease of native coronary artery without angina pectoris: Secondary | ICD-10-CM | POA: Insufficient documentation

## 2023-02-06 LAB — CBC WITH DIFFERENTIAL (CANCER CENTER ONLY)
Abs Immature Granulocytes: 0.11 10*3/uL — ABNORMAL HIGH (ref 0.00–0.07)
Basophils Absolute: 0.1 10*3/uL (ref 0.0–0.1)
Basophils Relative: 1 %
Eosinophils Absolute: 0.3 10*3/uL (ref 0.0–0.5)
Eosinophils Relative: 4 %
HCT: 54.6 % — ABNORMAL HIGH (ref 39.0–52.0)
Hemoglobin: 18.6 g/dL — ABNORMAL HIGH (ref 13.0–17.0)
Immature Granulocytes: 2 %
Lymphocytes Relative: 22 %
Lymphs Abs: 1.5 10*3/uL (ref 0.7–4.0)
MCH: 32.4 pg (ref 26.0–34.0)
MCHC: 34.1 g/dL (ref 30.0–36.0)
MCV: 95.1 fL (ref 80.0–100.0)
Monocytes Absolute: 0.7 10*3/uL (ref 0.1–1.0)
Monocytes Relative: 10 %
Neutro Abs: 4.3 10*3/uL (ref 1.7–7.7)
Neutrophils Relative %: 61 %
Platelet Count: 172 10*3/uL (ref 150–400)
RBC: 5.74 MIL/uL (ref 4.22–5.81)
RDW: 13 % (ref 11.5–15.5)
WBC Count: 6.9 10*3/uL (ref 4.0–10.5)
nRBC: 0 % (ref 0.0–0.2)

## 2023-02-06 LAB — SAVE SMEAR(SSMR), FOR PROVIDER SLIDE REVIEW

## 2023-02-07 LAB — VITAMIN B12: Vitamin B-12: 355 pg/mL (ref 180–914)

## 2023-02-14 NOTE — Progress Notes (Signed)
  Cardiology Office Note:    Date:  02/15/2023  ID:  [name], [name], DOB [birth year], MRN [id number] PCP: Pincus Sanes, MD  [facility] Providers Cardiologist:  Tonny Bollman, MD          Patient Profile:   Coronary artery disease S/p Inf MI in 05/1999 tx with stent x 3 to RCA S/p cutting balloon POBA to RCA 2/2 ISR in 04/2000 LHC 04/26/20: oLM 25, pLCx 40, mLAD 50, dLAD 20, D3 25; OM2 25; pRCA 60, prox stent ok, mid 30, distal stent 25, 95; PDA80 Myoview in 04/2008: low risk  ETT 06/2012: low risk Hypertension Diabetes mellitus Hyperlipidemia BPH          History of Present Illness:   [name], [name] is a 82 y.o. male who returns for f/u of coronary artery disease. He was last seen 02/13/22. He is here alone. He has been doing well w/o chest pain, shortness of breath, syncope, leg edema. He was recently seen by hematology for polycythemia. His HCTZ dose was reduced from twice daily to once daily.   ROS see HPI    Studies Reviewed:    EKG:  NSR, HR 58, 1st degree AVB, PR 378 ms, QTc 394 ms, Inf Qs, TW inversions aVR, 3, aVF, ant Qs, no change from prior tracing  Risk Assessment/Calculations:     HYPERTENSION CONTROL Vitals:   02/15/23 0849 02/15/23 0942  BP: (!) 140/64 (!) 150/80    The patient's blood pressure is elevated above target today.  In order to address the patient's elevated BP: Blood pressure will be monitored at home to determine if medication changes need to be made.          Physical Exam:   VS:  BP (!) 150/80   Pulse (!) 58   Ht 6' (1.829 m)   Wt 247 lb 3.2 oz (112.1 kg)   SpO2 96%   BMI 33.53 kg/m    Wt Readings from Last 3 Encounters:  02/15/23 247 lb 3.2 oz (112.1 kg)  02/06/23 247 lb (112 kg)  01/07/23 248 lb (112.5 kg)    Physical Exam     ASSESSMENT AND PLAN:   CAD (coronary artery disease) S/p Inf MI in 2000 tx w stent x 3 to RCA and POBA w cutting balloon in 2001.  ETT in 2013 was low risk.  He is not having chest pain,  shortness of breath to suggest angina. Continue ASA 81 mg once daily, Crestor 40 mg once daily. F/u 1 year.   Essential hypertension BP is elevated. I suspect this may be due to reduction in his HCTZ dose. I have asked him to monitor BP for 2 weeks and send readings for review. Continue Losartan 100 mg once daily, Lopressor 25 mg twice daily, HCTZ 25 mg once daily. If BP above target, I would suggest adding Amlodipine.  Hyperlipidemia LDL goal <70 Recent labs from primary care reviewed. LDL optimal at 54 in 12/2022. Continue Crestor 40 mg once daily.   First degree AV block Asymptomatic. I would avoid adjusting his beta-blocker any further.       Dispo:  Return in about 1 year (around 02/15/2024) for Routine Follow Up, w/ Dr. Excell Seltzer, or Tereso Newcomer, PA-C.  Signed, Tereso Newcomer, PA-C

## 2023-02-15 ENCOUNTER — Ambulatory Visit: Payer: PPO | Attending: Physician Assistant | Admitting: Physician Assistant

## 2023-02-15 ENCOUNTER — Encounter: Payer: Self-pay | Admitting: Physician Assistant

## 2023-02-15 VITALS — BP 150/80 | HR 58 | Ht 72.0 in | Wt 247.2 lb

## 2023-02-15 DIAGNOSIS — I440 Atrioventricular block, first degree: Secondary | ICD-10-CM

## 2023-02-15 DIAGNOSIS — E785 Hyperlipidemia, unspecified: Secondary | ICD-10-CM

## 2023-02-15 DIAGNOSIS — I2510 Atherosclerotic heart disease of native coronary artery without angina pectoris: Secondary | ICD-10-CM | POA: Diagnosis not present

## 2023-02-15 DIAGNOSIS — I10 Essential (primary) hypertension: Secondary | ICD-10-CM | POA: Diagnosis not present

## 2023-02-15 NOTE — Assessment & Plan Note (Signed)
Asymptomatic. I would avoid adjusting his beta-blocker any further.

## 2023-02-15 NOTE — Assessment & Plan Note (Signed)
S/p Inf MI in 2000 tx w stent x 3 to RCA and POBA w cutting balloon in 2001.  ETT in 2013 was low risk.  He is not having chest pain, shortness of breath to suggest angina. Continue ASA 81 mg once daily, Crestor 40 mg once daily. F/u 1 year.

## 2023-02-15 NOTE — Assessment & Plan Note (Signed)
BP is elevated. I suspect this may be due to reduction in his HCTZ dose. I have asked him to monitor BP for 2 weeks and send readings for review. Continue Losartan 100 mg once daily, Lopressor 25 mg twice daily, HCTZ 25 mg once daily. If BP above target, I would suggest adding Amlodipine.

## 2023-02-15 NOTE — Patient Instructions (Signed)
Medication Instructions:  No changes. Continue your current medications. *If you need a refill on your cardiac medications before your next appointment, please call your pharmacy*   Follow-Up: At [facility], you and your health needs are our priority.  As part of our continuing mission to provide you with exceptional heart care, we have created designated Provider Care Teams.  These Care Teams include your primary Cardiologist (physician) and Advanced Practice Providers (APPs -  Physician Assistants and Nurse Practitioners) who all work together to provide you with the care you need, when you need it.  We recommend signing up for the patient portal called "MyChart".  Sign up information is provided on this After Visit Summary.  MyChart is used to connect with patients for Virtual Visits (Telemedicine).  Patients are able to view lab/test results, encounter notes, upcoming appointments, etc.  Non-urgent messages can be sent to your provider as well.   To learn more about what you can do with MyChart, go to [email or web address].    Your next appointment:   12 month(s)  Provider:   Tonny Bollman, MD  or Tereso Newcomer, PA-C         Other Instructions Monitor your blood pressure for 2 weeks and send those readings to me by MyChart. Make sure your cuff fits appropriately. Check you blood pressure 2-3 hours after taking medications. Make sure you are resting for 15-20 minutes. Make sure your feet are flat on the floor. Do not drink coffee or caffeine right before checking your blood pressure.

## 2023-02-15 NOTE — Assessment & Plan Note (Signed)
Recent labs from primary care reviewed. LDL optimal at 54 in 12/2022. Continue Crestor 40 mg once daily.

## 2023-02-18 ENCOUNTER — Telehealth: Payer: Self-pay | Admitting: Internal Medicine

## 2023-02-18 NOTE — Telephone Encounter (Signed)
Patient said he would like a referral to Dr. Hart Rochester, a rheumatologist. When asked for the name of the office Dr. Anson [place] worked at, the patient got upset and started yelling. He said he would call back with the name of the office.

## 2023-02-19 NOTE — Telephone Encounter (Signed)
I need to know what the referral is for.  I know where Dr Dierdre Forth is.

## 2023-02-20 ENCOUNTER — Encounter: Payer: Self-pay | Admitting: Internal Medicine

## 2023-02-20 NOTE — Telephone Encounter (Signed)
Message left for patient to contact office back with reason for referral so it can be placed.  If he calls back please obtain this information and let me know.  Thanks

## 2023-02-20 NOTE — Telephone Encounter (Signed)
Note made for upcoming appointment

## 2023-02-20 NOTE — Telephone Encounter (Signed)
Patient said he needs to go because his bones and muscles are hurting. He said he will discuss further at his appointment with Dr. Lawerance Bach on 02/21/2023.

## 2023-02-20 NOTE — Progress Notes (Unsigned)
    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   MRN: [id number]      HPI [name] is here for No chief complaint on file.    Referral to Dr Dierdre Forth.     Medications and allergies reviewed with patient and updated if appropriate.  Current Outpatient Medications on File Prior to Visit  Medication Sig Dispense Refill   acetaminophen (TYLENOL 8 HOUR) 650 MG CR tablet Take 650 mg by mouth every 8 (eight) hours as needed for pain.     aspirin EC 81 MG tablet Take 1 tablet (81 mg total) by mouth daily. Swallow whole. 90 tablet 3   augmented betamethasone dipropionate (DIPROLENE-AF) 0.05 % cream Apply 1 application topically as needed (iritation).      blood glucose meter kit and supplies KIT Dispense based on patient and insurance preference. Use up to four times daily as directed. (FOR E11.9). 1 each 0   co-enzyme Q-10 30 MG capsule Take 30 mg by mouth daily.     empagliflozin (JARDIANCE) 25 MG TABS tablet Take 1 tablet (25 mg total) by mouth daily before breakfast. 90 tablet 1   hydrochlorothiazide (HYDRODIURIL) 25 MG tablet Take 1 tablet (25 mg total) by mouth daily. 90 tablet 3   ipratropium (ATROVENT) 0.06 % nasal spray Place 2 sprays into both nostrils 4 (four) times daily. 15 mL 12   losartan (COZAAR) 100 MG tablet TAKE 1 TABLET(100 MG) BY MOUTH DAILY 90 tablet 1   metFORMIN (GLUCOPHAGE) 1000 MG tablet TAKE 1 TABLET(1000 MG) BY MOUTH TWICE DAILY WITH A MEAL 180 tablet 1   metoprolol tartrate (LOPRESSOR) 25 MG tablet TAKE 1 TABLET(25 MG) BY MOUTH TWICE DAILY 180 tablet 1   ONE TOUCH ULTRA TEST test strip TEST UP TO FOUR TIMES DAILY AS DIRECTED 300 each 5   OneTouch Delica Lancets 33G MISC TEST UP TO FOUR TIMES DAILY AS DIRECTED 100 each 2   rosuvastatin (CRESTOR) 40 MG tablet TAKE 1 TABLET(40 MG) BY MOUTH DAILY 90 tablet 3   tamsulosin (FLOMAX) 0.4 MG CAPS capsule Take 1 capsule (0.4 mg total) by mouth daily. 90 capsule 1   triamcinolone (KENALOG)  0.1 % Apply topically 2 (two) times daily.     Zinc 25 MG TABS Take 25 mg by mouth daily.     No current facility-administered medications on file prior to visit.    Review of Systems     Objective:  There were no vitals filed for this visit. BP Readings from Last 3 Encounters:  02/15/23 (!) 150/80  02/06/23 139/80  01/07/23 120/82   Wt Readings from Last 3 Encounters:  02/15/23 247 lb 3.2 oz (112.1 kg)  02/06/23 247 lb (112 kg)  01/07/23 248 lb (112.5 kg)   There is no height or weight on file to calculate BMI.    Physical Exam         Assessment & Plan:    See Problem List for Assessment and Plan of chronic medical problems.

## 2023-02-21 ENCOUNTER — Ambulatory Visit (INDEPENDENT_AMBULATORY_CARE_PROVIDER_SITE_OTHER): Payer: PPO | Admitting: Internal Medicine

## 2023-02-21 VITALS — BP 136/68 | HR 50 | Temp 98.3°F | Ht 72.0 in | Wt 247.0 lb

## 2023-02-21 DIAGNOSIS — I10 Essential (primary) hypertension: Secondary | ICD-10-CM | POA: Diagnosis not present

## 2023-02-21 DIAGNOSIS — M2550 Pain in unspecified joint: Secondary | ICD-10-CM | POA: Diagnosis not present

## 2023-02-21 NOTE — Assessment & Plan Note (Signed)
Chronic Blood pressure well controlled Continue losartan 100 mg daily, metoprolol 25 mg bid Has been taking hydrochlorothiazide but not on a daily basis-blood pressure still uncontrolled-trying to see if that will influence the polycythemia

## 2023-02-21 NOTE — Patient Instructions (Addendum)
        Medications changes include :   you can take 3000 mg of tylenol / day safely    Try the stretching class.  Try natural anti-inflammatories.      Return if symptoms worsen or fail to improve.

## 2023-02-21 NOTE — Assessment & Plan Note (Signed)
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

## 2023-02-25 ENCOUNTER — Telehealth: Payer: Self-pay | Admitting: Internal Medicine

## 2023-02-25 DIAGNOSIS — L905 Scar conditions and fibrosis of skin: Secondary | ICD-10-CM | POA: Diagnosis not present

## 2023-02-25 DIAGNOSIS — L570 Actinic keratosis: Secondary | ICD-10-CM | POA: Diagnosis not present

## 2023-02-25 DIAGNOSIS — D2261 Melanocytic nevi of right upper limb, including shoulder: Secondary | ICD-10-CM | POA: Diagnosis not present

## 2023-02-25 DIAGNOSIS — L918 Other hypertrophic disorders of the skin: Secondary | ICD-10-CM | POA: Diagnosis not present

## 2023-02-25 DIAGNOSIS — D2271 Melanocytic nevi of right lower limb, including hip: Secondary | ICD-10-CM | POA: Diagnosis not present

## 2023-02-25 DIAGNOSIS — Z85828 Personal history of other malignant neoplasm of skin: Secondary | ICD-10-CM | POA: Diagnosis not present

## 2023-02-25 DIAGNOSIS — L821 Other seborrheic keratosis: Secondary | ICD-10-CM | POA: Diagnosis not present

## 2023-02-25 DIAGNOSIS — D1801 Hemangioma of skin and subcutaneous tissue: Secondary | ICD-10-CM | POA: Diagnosis not present

## 2023-02-25 NOTE — Telephone Encounter (Signed)
Contacted [name], [name] to schedule their annual wellness visit. Appointment made for 03/12/2023.  [facility] Care Guide [facility] AWV TEAM Direct Dial: [phone]

## 2023-03-01 LAB — JAK 2 V617F (GENPATH)

## 2023-03-06 ENCOUNTER — Telehealth: Payer: Self-pay | Admitting: Physician Assistant

## 2023-03-06 NOTE — Telephone Encounter (Signed)
List of bp readings   Will be in provider box by eod

## 2023-03-06 NOTE — Telephone Encounter (Signed)
Received blood pressure readings and placed on [employer].

## 2023-03-08 MED ORDER — AMLODIPINE BESYLATE 5 MG PO TABS: 5.0000 mg | ORAL_TABLET | Freq: Every day | ORAL | 3 refills | Status: DC

## 2023-03-08 NOTE — Telephone Encounter (Signed)
Left VM for return call

## 2023-03-08 NOTE — Telephone Encounter (Signed)
Reviewed BP readings. BP is slightly above goal.  Pt contact # [phone] PLAN: -Start Amlodipine 2.5 mg once daily.  Tereso Newcomer, PA-C    03/08/2023 1:42 PM

## 2023-03-12 ENCOUNTER — Ambulatory Visit (INDEPENDENT_AMBULATORY_CARE_PROVIDER_SITE_OTHER): Payer: PPO

## 2023-03-12 VITALS — Wt 247.0 lb

## 2023-03-12 DIAGNOSIS — Z0000 Encounter for general adult medical examination without abnormal findings: Secondary | ICD-10-CM | POA: Diagnosis not present

## 2023-03-12 MED ORDER — AMLODIPINE BESYLATE 2.5 MG PO TABS: 2.5000 mg | ORAL_TABLET | Freq: Every day | ORAL | 3 refills | Status: DC

## 2023-03-12 NOTE — Patient Instructions (Signed)
[name] , Thank you for taking time to come for your Medicare Wellness Visit. I appreciate your ongoing commitment to your health goals. Please review the following plan we discussed and let me know if I can assist you in the future.   These are the goals we discussed:  Goals      To stay alive.     Weight < 240 lb (108.863 kg)     Lose weight;  Exercise;  (recommended 30 minutes; 5 days a week) BMI reviewed and educated Educated regarding online nutrition programs as WikiBlast.com.cy and LimitLaws.com.cy;  Consider using x1 week;              This is a list of the screening recommended for you and due dates:  Health Maintenance  Topic Date Due   Complete foot exam   07/16/2019   COVID-19 Vaccine (6 - 2023-24 season) 03/27/2024*   Flu Shot  05/16/2023   Eye exam for diabetics  06/02/2023   Hemoglobin A1C  07/10/2023   Yearly kidney function blood test for diabetes  01/07/2024   Yearly kidney health urinalysis for diabetes  01/07/2024   Medicare Annual Wellness Visit  03/11/2024   DTaP/Tdap/Td vaccine (3 - Td or Tdap) 04/02/2032   Pneumonia Vaccine  Completed   Hepatitis C Screening  Completed   Zoster (Shingles) Vaccine  Completed   HPV Vaccine  Aged Out   Colon Cancer Screening  Discontinued  *Topic was postponed. The date shown is not the original due date.    Advanced directives: Please bring a copy of your health care power of attorney and living will to the office to be added to your chart at your convenience.   Conditions/risks identified: Aim for 30 minutes of exercise or brisk walking, 6-8 glasses of water, and 5 servings of fruits and vegetables each day.   Next appointment: Follow up in one year for your annual wellness visit. 03/12/24  Preventive Care 65 Years and Older, Male  Preventive care refers to lifestyle choices and visits with your health care provider that can promote health and wellness. What does preventive care include? A yearly physical  exam. This is also called an annual well check. Dental exams once or twice a year. Routine eye exams. Ask your health care provider how often you should have your eyes checked. Personal lifestyle choices, including: Daily care of your teeth and gums. Regular physical activity. Eating a healthy diet. Avoiding tobacco and drug use. Limiting alcohol use. Practicing safe sex. Taking low doses of aspirin every day. Taking vitamin and mineral supplements as recommended by your health care provider. What happens during an annual well check? The services and screenings done by your health care provider during your annual well check will depend on your age, overall health, lifestyle risk factors, and family history of disease. Counseling  Your health care provider may ask you questions about your: Alcohol use. Tobacco use. Drug use. Emotional well-being. Home and relationship well-being. Sexual activity. Eating habits. History of falls. Memory and ability to understand (cognition). Work and work Astronomer. Screening  You may have the following tests or measurements: Height, weight, and BMI. Blood pressure. Lipid and cholesterol levels. These may be checked every 5 years, or more frequently if you are over 80 years old. Skin check. Lung cancer screening. You may have this screening every year starting at age 80 if you have a 30-pack-year history of smoking and currently smoke or have quit within the past 15 years.  Fecal occult blood test (FOBT) of the stool. You may have this test every year starting at age 80. Flexible sigmoidoscopy or colonoscopy. You may have a sigmoidoscopy every 5 years or a colonoscopy every 10 years starting at age 80. Prostate cancer screening. Recommendations will vary depending on your family history and other risks. Hepatitis C blood test. Hepatitis B blood test. Sexually transmitted disease (STD) testing. Diabetes screening. This is done by checking your  blood sugar (glucose) after you have not eaten for a while (fasting). You may have this done every 1-3 years. Abdominal aortic aneurysm (AAA) screening. You may need this if you are a current or former smoker. Osteoporosis. You may be screened starting at age 80 if you are at high risk. Talk with your health care provider about your test results, treatment options, and if necessary, the need for more tests. Vaccines  Your health care provider may recommend certain vaccines, such as: Influenza vaccine. This is recommended every year. Tetanus, diphtheria, and acellular pertussis (Tdap, Td) vaccine. You may need a Td booster every 10 years. Zoster vaccine. You may need this after age 80. Pneumococcal 13-valent conjugate (PCV13) vaccine. One dose is recommended after age 80. Pneumococcal polysaccharide (PPSV23) vaccine. One dose is recommended after age 80. Talk to your health care provider about which screenings and vaccines you need and how often you need them. This information is not intended to replace advice given to you by your health care provider. Make sure you discuss any questions you have with your health care provider. Document Released: 10/28/2015 Document Revised: 06/20/2016 Document Reviewed: 08/02/2015 Elsevier Interactive Patient Education  2017 [employer].  Fall Prevention in the Home Falls can cause injuries. They can happen to people of all ages. There are many things you can do to make your home safe and to help prevent falls. What can I do on the outside of my home? Regularly fix the edges of walkways and driveways and fix any cracks. Remove anything that might make you trip as you walk through a door, such as a raised step or threshold. Trim any bushes or trees on the path to your home. Use bright outdoor lighting. Clear any walking paths of anything that might make someone trip, such as rocks or tools. Regularly check to see if handrails are loose or broken. Make sure  that both sides of any steps have handrails. Any raised decks and porches should have guardrails on the edges. Have any leaves, snow, or ice cleared regularly. Use sand or salt on walking paths during winter. Clean up any spills in your garage right away. This includes oil or grease spills. What can I do in the bathroom? Use night lights. Install grab bars by the toilet and in the tub and shower. Do not use towel bars as grab bars. Use non-skid mats or decals in the tub or shower. If you need to sit down in the shower, use a plastic, non-slip stool. Keep the floor dry. Clean up any water that spills on the floor as soon as it happens. Remove soap buildup in the tub or shower regularly. Attach bath mats securely with double-sided non-slip rug tape. Do not have throw rugs and other things on the floor that can make you trip. What can I do in the bedroom? Use night lights. Make sure that you have a light by your bed that is easy to reach. Do not use any sheets or blankets that are too big for your bed. They  should not hang down onto the floor. Have a firm chair that has side arms. You can use this for support while you get dressed. Do not have throw rugs and other things on the floor that can make you trip. What can I do in the kitchen? Clean up any spills right away. Avoid walking on wet floors. Keep items that you use a lot in easy-to-reach places. If you need to reach something above you, use a strong step stool that has a grab bar. Keep electrical cords out of the way. Do not use floor polish or wax that makes floors slippery. If you must use wax, use non-skid floor wax. Do not have throw rugs and other things on the floor that can make you trip. What can I do with my stairs? Do not leave any items on the stairs. Make sure that there are handrails on both sides of the stairs and use them. Fix handrails that are broken or loose. Make sure that handrails are as long as the  stairways. Check any carpeting to make sure that it is firmly attached to the stairs. Fix any carpet that is loose or worn. Avoid having throw rugs at the top or bottom of the stairs. If you do have throw rugs, attach them to the floor with carpet tape. Make sure that you have a light switch at the top of the stairs and the bottom of the stairs. If you do not have them, ask someone to add them for you. What else can I do to help prevent falls? Wear shoes that: Do not have high heels. Have rubber bottoms. Are comfortable and fit you well. Are closed at the toe. Do not wear sandals. If you use a stepladder: Make sure that it is fully opened. Do not climb a closed stepladder. Make sure that both sides of the stepladder are locked into place. Ask someone to hold it for you, if possible. Clearly mark and make sure that you can see: Any grab bars or handrails. First and last steps. Where the edge of each step is. Use tools that help you move around (mobility aids) if they are needed. These include: Canes. Walkers. Scooters. Crutches. Turn on the lights when you go into a dark area. Replace any light bulbs as soon as they burn out. Set up your furniture so you have a clear path. Avoid moving your furniture around. If any of your floors are uneven, fix them. If there are any pets around you, be aware of where they are. Review your medicines with your doctor. Some medicines can make you feel dizzy. This can increase your chance of falling. Ask your doctor what other things that you can do to help prevent falls. This information is not intended to replace advice given to you by your health care provider. Make sure you discuss any questions you have with your health care provider. Document Released: 07/28/2009 Document Revised: 03/08/2016 Document Reviewed: 11/05/2014 Elsevier Interactive Patient Education  2017 [employer].

## 2023-03-12 NOTE — Progress Notes (Signed)
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  Patient Location: Home  Provider Location: Home Office  I discussed the limitations of evaluation and management by telemedicine. The patient expressed understanding and agreed to proceed.  Cardiac Risk Factors include: advanced age (>38men, >35 women);hypertension     Objective:    Today's Vitals   03/12/23 1552  Weight: 247 lb (112 kg)   Body mass index is 33.5 kg/m.     03/12/2023    3:58 PM 02/06/2023   11:11 AM 05/15/2022    3:55 PM 09/19/2015    9:42 AM  Advanced Directives  Does Patient Have a Medical Advance Directive? Yes No Yes Yes  Type of Estate agent of [place];Living will  Living will;Healthcare Power of [employer] Power of Attorney  Does patient want to make changes to medical advance directive?   No - Patient declined   Copy of Healthcare Power of Attorney in Chart? No - copy requested  No - copy requested Yes  Would patient like information on creating a medical advance directive?  No - Patient declined      Current Medications (verified) Outpatient Encounter Medications as of 03/12/2023  Medication Sig   acetaminophen (TYLENOL 8 HOUR) 650 MG CR tablet Take 650 mg by mouth every 8 (eight) hours as needed for pain.   amLODipine (NORVASC) 2.5 MG tablet Take 1 tablet (2.5 mg total) by mouth daily.   aspirin EC 81 MG tablet Take 1 tablet (81 mg total) by mouth daily. Swallow whole.   augmented betamethasone dipropionate (DIPROLENE-AF) 0.05 % cream Apply 1 application topically as needed (iritation).    blood glucose meter kit and supplies KIT Dispense based on patient and insurance preference. Use up to four times daily as  directed. (FOR E11.9).   co-enzyme Q-10 30 MG capsule Take 30 mg by mouth daily.   empagliflozin (JARDIANCE) 25 MG TABS tablet Take 1 tablet (25 mg total) by mouth daily before breakfast.   hydrochlorothiazide (HYDRODIURIL) 25 MG tablet Take 1 tablet (25 mg total) by mouth daily.   ipratropium (ATROVENT) 0.06 % nasal spray Place 2 sprays into both nostrils 4 (four) times daily.   losartan (COZAAR) 100 MG tablet TAKE 1 TABLET(100 MG) BY MOUTH DAILY   metFORMIN (GLUCOPHAGE) 1000 MG tablet TAKE 1 TABLET(1000 MG) BY MOUTH TWICE DAILY WITH A MEAL   metoprolol tartrate (LOPRESSOR) 25 MG tablet TAKE 1 TABLET(25 MG) BY MOUTH TWICE DAILY   ONE TOUCH ULTRA TEST test strip TEST UP TO FOUR TIMES DAILY AS DIRECTED   OneTouch Delica Lancets 33G MISC TEST UP TO FOUR TIMES DAILY AS DIRECTED   rosuvastatin (CRESTOR) 40 MG tablet TAKE 1 TABLET(40 MG) BY MOUTH DAILY   tamsulosin (FLOMAX) 0.4 MG CAPS capsule Take 1 capsule (0.4 mg total) by mouth daily.   triamcinolone (KENALOG) 0.1 % Apply topically 2 (two) times daily.   Zinc 25 MG TABS Take 25 mg by mouth daily.   No facility-administered encounter medications on file as of 03/12/2023.    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 Initial MI @ 36   Diabetes Father    Stroke Father    COPD Mother    Diabetes Mother    Hypertension Brother        DM-  CABG   Kidney failure Sister    Social History   Socioeconomic History   Marital status: Married    Spouse name: Not on file   Number of children: Not on file   Years of education: Not on file   Highest education level: Not on file  Occupational History   Occupation: retired Environmental health practitioner: RETIRED  Tobacco Use   Smoking status: Former    Types: Cigarettes    Quit date: 10/15/1997    Years since quitting: 25.4   Smokeless tobacco: Never   Tobacco comments:    smoked 1961-1999, up to 1/2 ppd. Off 3-4 years 1961-1999. Cigar 3/week for 1 year  Vaping Use   Vaping Use: Not on file  Substance and Sexual Activity   Alcohol use: Yes    Alcohol/week: 0.0 standard drinks of alcohol    Comment: socially  1-2/ day   Drug use: No   Sexual activity: Yes  Other Topics Concern   Not on file  Social History Narrative   Not on file   Social Determinants of Health   Financial Resource Strain: Low Risk  (03/12/2023)   Overall Financial Resource Strain (CARDIA)    Difficulty of Paying Living Expenses: Not hard at all  Food Insecurity: No Food Insecurity (03/12/2023)   Hunger Vital Sign    Worried About Running Out of Food in the Last Year: Never true    Ran Out of Food in the Last Year: Never true  Transportation Needs: No Transportation Needs (05/15/2022)   PRAPARE - Administrator, Civil Service (Medical): No    Lack of Transportation (Non-Medical): No  Physical Activity: Sufficiently Active (03/12/2023)   Exercise Vital Sign    Days of Exercise per Week: 7 days    Minutes of Exercise per Session: 30 min  Stress: No Stress Concern Present (03/12/2023)   [employer] of Occupational Health - Occupational Stress Questionnaire    Feeling of Stress : Not at all  Social Connections: Socially Integrated (03/12/2023)   Social Connection and Isolation Panel [NHANES]    Frequency of Communication with Friends and Family: More than three times a week    Frequency of Social  Gatherings with Friends and Family: More than three times a week    Attends Religious Services: More than 4 times per year    Active Member of [employer] or Organizations: Yes    Attends Engineer, structural: More than 4 times per year    Marital Status: Married    Tobacco Counseling Counseling given: Yes Tobacco comments: smoked 1961-1999, up to 1/2 ppd. Off 3-4 years 1961-1999. Cigar 3/week  for 1 year   Clinical Intake:  Pre-visit preparation completed: Yes  Pain : No/denies pain     Nutritional Risks: None Diabetes: No  How often do you need to have someone help you when you read instructions, pamphlets, or other written materials from your doctor or pharmacy?: 1 - Never  Diabetic?no   Interpreter Needed?: No  Information entered by :: Annabell Sabal ,CMA   Activities of Daily Living    03/12/2023    4:00 PM 05/15/2022    4:12 PM  In your present state of health, do you have any difficulty performing the following activities:  Hearing? 1 0  Vision? 0 0  Difficulty concentrating or making decisions? 0 0  Walking or climbing stairs? 0 0  Dressing or bathing? 0 0  Doing errands, shopping? 0 0  Preparing Food and eating ? N N  Using the Toilet? N N  In the past six months, have you accidently leaked urine? N N  Do you have problems with loss of bowel control? N N  Managing your Medications? N N  Managing your Finances? N N  Housekeeping or managing your Housekeeping? N N    Patient Care Team: Pincus Sanes, MD as PCP - General (Internal Medicine) Tonny Bollman, MD as PCP - Cardiology (Cardiology)  Indicate any recent Medical Services you may have received from other than [facility] providers in the past year (date may be approximate).     Assessment:   This is a routine wellness examination for Adi.  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  Vision Screening - Comments:: Wears rx glasses - up to date with  routine eye exams with  Dr Hyacinth Meeker  Dietary issues and exercise activities discussed: Current Exercise Habits: Structured exercise class, Type of exercise: treadmill;strength training/weights, Time (Minutes): 40, Frequency (Times/Week): 3, Weekly Exercise (Minutes/Week): 120, Intensity: Moderate   Goals Addressed   None   Depression Screen    03/12/2023    3:54 PM 02/21/2023    8:25 AM 01/07/2023    8:34 AM 07/02/2022    8:21 AM 05/15/2022    4:09 PM 01/01/2022    8:26 AM 12/28/2019    9:32 AM  PHQ 2/9 Scores  PHQ - 2 Score 0 0 0 0 0 0 0  PHQ- 9 Score 0 0 0        Fall Risk    03/12/2023    3:59 PM 02/21/2023    8:25 AM 01/07/2023    8:34 AM 07/02/2022    8:21 AM 05/15/2022    4:12 PM  Fall Risk   Falls in the past year? 0 0 0 0 0  Number falls in past yr: 0 0 0 0 0  Injury with Fall? 0 0 0 0 0  Risk for fall due to : No Fall Risks No Fall Risks No Fall Risks No Fall Risks No Fall Risks  Follow up Falls prevention discussed;Falls evaluation completed Falls evaluation completed Falls evaluation completed Falls evaluation completed Falls evaluation completed    FALL RISK PREVENTION PERTAINING TO THE HOME:  Any stairs in or around the home? Yes  If so, are there any without handrails? No  Home free of loose throw rugs in walkways, pet beds, electrical cords, etc? No  Adequate lighting in your home to reduce risk of falls? Yes   ASSISTIVE DEVICES UTILIZED TO PREVENT FALLS:  Life alert? No  Use of a cane, walker or w/c? No  Grab bars in the bathroom? No  Shower chair or bench in shower? No  Elevated toilet seat or a handicapped toilet? Yes   TIMED UP AND GO:  Was the test performed?  NO Televisit  .   Cognitive Function:        03/12/2023    3:55 PM 05/15/2022    4:14 PM  6CIT Screen  What Year? 0 points 0 points  What month? 0 points 0 points  What time? 0 points 0 points  Count back from 20 0 points 0 points  Months in reverse 2 points 0 points  Repeat phrase 2 points 0  points  Total Score 4 points 0 points    Immunizations Immunization History  Administered Date(s) Administered   Fluad Quad(high Dose 65+) 06/26/2020, 07/02/2022   Influenza Whole 09/13/2010, 07/18/2013   Influenza, High Dose Seasonal PF 07/06/2017, 06/18/2018, 06/03/2019   Influenza,inj,Quad PF,6+ Mos 07/06/2017   Influenza,inj,quad, With Preservative 10/15/2016   Influenza-Unspecified 07/10/2014, 07/12/2015, 07/19/2016, 07/06/2017, 01/29/2019, 06/15/2021   PFIZER(Purple Top)SARS-COV-2 Vaccination 10/28/2019, 11/16/2019, 07/17/2020, 01/27/2021   PNEUMOCOCCAL CONJUGATE-20 04/02/2022   Pfizer Covid-19 Vaccine Bivalent Booster 44yrs & up 04/02/2022   Pneumococcal Conjugate-13 07/19/2015   Pneumococcal Polysaccharide-23 10/19/2010   Tdap 05/28/2011, 04/02/2022   Zoster Recombinat (Shingrix) 07/06/2017, 11/22/2017   Zoster, Live 10/01/2003    TDAP status: Up to date  Flu Vaccine status: Up to date  Pneumococcal vaccine status: Up to date  Covid-19 vaccine status: Declined, Education has been provided regarding the importance of this vaccine but patient still declined. Advised may receive this vaccine at local pharmacy or Health Dept.or vaccine clinic. Aware to provide a copy of the vaccination record if obtained from local pharmacy or Health Dept. Verbalized acceptance and understanding.  Qualifies for Shingles Vaccine? Yes   Zostavax completed Yes   Shingrix Completed?: Yes  Screening Tests Health Maintenance  Topic Date Due   FOOT EXAM  07/16/2019   COVID-19 Vaccine (6 - 2023-24 season) 03/27/2024 (Originally 06/15/2022)   INFLUENZA VACCINE  05/16/2023   OPHTHALMOLOGY EXAM  06/02/2023   HEMOGLOBIN A1C  07/10/2023   Diabetic kidney evaluation - eGFR measurement  01/07/2024   Diabetic kidney evaluation - Urine ACR  01/07/2024   Medicare Annual Wellness (AWV)  03/11/2024   DTaP/Tdap/Td (3 - Td or Tdap) 04/02/2032   Pneumonia Vaccine 84+ Years old  Completed   Hepatitis C  Screening  Completed   Zoster Vaccines- Shingrix  Completed   HPV VACCINES  Aged Out   Colonoscopy  Discontinued    Health Maintenance  Health Maintenance Due  Topic Date Due   FOOT EXAM  07/16/2019    Colorectal cancer screening: No longer required.   Lung Cancer Screening: (Low Dose CT Chest recommended if Age 3-80 years, 30 pack-year currently smoking OR have quit w/in 15years.) does not qualify.   Lung Cancer Screening Referral: NO  Additional Screening:  Hepatitis C Screening: does not qualify; Due age   Vision Screening: Recommended annual ophthalmology exams for early detection of glaucoma and other disorders of the eye. Is the patient up to date with their annual eye exam?  Yes  Who is the provider or what is the name of the office in which the patient attends annual eye exams? Dr Hyacinth Meeker  If pt is not established with a provider, would they like to be referred to a provider to establish care? No .   Dental Screening: Recommended annual dental exams for proper oral hygiene  Community Resource Referral /  Chronic Care Management: CRR required this visit?  No   CCM required this visit?  No      Plan:     I have personally reviewed and noted the following in the patient's chart:   Medical and social history Use of alcohol, tobacco or illicit drugs  Current medications and supplements including opioid prescriptions. Patient is not currently taking opioid prescriptions. Functional ability and status Nutritional status Physical activity Advanced directives List of other physicians Hospitalizations, surgeries, and ER visits in previous 12 months Vitals Screenings to include cognitive, depression, and falls Referrals and appointments  In addition, I have reviewed and discussed with patient certain preventive protocols, quality metrics, and best practice recommendations. A written personalized care plan for preventive services as well as general preventive health  recommendations were provided to patient.     Annabell Sabal, CMA   03/12/2023   Nurse Notes: None

## 2023-03-12 NOTE — Telephone Encounter (Signed)
Left VM for return call regarding Amlodipine prescription dose.

## 2023-03-13 NOTE — Telephone Encounter (Signed)
This is the third attempt to reach this patient by phone regarding new medication. A My Chart message has been sent to the patient with detailed message to start amlodipine 2.5 mg daily.

## 2023-03-14 NOTE — Telephone Encounter (Signed)
Spoke with patient to review new medication and verify he is able to pick it up. Patient states he is picking it up today.  Patient verbalized understanding and had no questions.

## 2023-03-20 ENCOUNTER — Inpatient Hospital Stay: Payer: PPO | Admitting: Oncology

## 2023-03-20 ENCOUNTER — Inpatient Hospital Stay: Payer: PPO | Attending: Nurse Practitioner

## 2023-03-20 VITALS — BP 142/78 | HR 60 | Temp 98.2°F | Resp 18 | Ht 72.0 in | Wt 246.0 lb

## 2023-03-20 DIAGNOSIS — D751 Secondary polycythemia: Secondary | ICD-10-CM | POA: Diagnosis not present

## 2023-03-20 DIAGNOSIS — I2510 Atherosclerotic heart disease of native coronary artery without angina pectoris: Secondary | ICD-10-CM | POA: Insufficient documentation

## 2023-03-20 DIAGNOSIS — D582 Other hemoglobinopathies: Secondary | ICD-10-CM

## 2023-03-20 DIAGNOSIS — F1090 Alcohol use, unspecified, uncomplicated: Secondary | ICD-10-CM | POA: Diagnosis not present

## 2023-03-20 DIAGNOSIS — N400 Enlarged prostate without lower urinary tract symptoms: Secondary | ICD-10-CM | POA: Insufficient documentation

## 2023-03-20 DIAGNOSIS — E119 Type 2 diabetes mellitus without complications: Secondary | ICD-10-CM | POA: Diagnosis not present

## 2023-03-20 DIAGNOSIS — I10 Essential (primary) hypertension: Secondary | ICD-10-CM | POA: Diagnosis not present

## 2023-03-20 DIAGNOSIS — I252 Old myocardial infarction: Secondary | ICD-10-CM | POA: Insufficient documentation

## 2023-03-20 LAB — CBC WITH DIFFERENTIAL (CANCER CENTER ONLY)
Abs Immature Granulocytes: 0.08 10*3/uL — ABNORMAL HIGH (ref 0.00–0.07)
Basophils Absolute: 0.1 10*3/uL (ref 0.0–0.1)
Basophils Relative: 1 %
Eosinophils Absolute: 0.2 10*3/uL (ref 0.0–0.5)
Eosinophils Relative: 3 %
HCT: 51.8 % (ref 39.0–52.0)
Hemoglobin: 18 g/dL — ABNORMAL HIGH (ref 13.0–17.0)
Immature Granulocytes: 1 %
Lymphocytes Relative: 21 %
Lymphs Abs: 1.5 10*3/uL (ref 0.7–4.0)
MCH: 32.5 pg (ref 26.0–34.0)
MCHC: 34.7 g/dL (ref 30.0–36.0)
MCV: 93.7 fL (ref 80.0–100.0)
Monocytes Absolute: 0.6 10*3/uL (ref 0.1–1.0)
Monocytes Relative: 9 %
Neutro Abs: 4.7 10*3/uL (ref 1.7–7.7)
Neutrophils Relative %: 65 %
Platelet Count: 162 10*3/uL (ref 150–400)
RBC: 5.53 MIL/uL (ref 4.22–5.81)
RDW: 12.8 % (ref 11.5–15.5)
WBC Count: 7.1 10*3/uL (ref 4.0–10.5)
nRBC: 0 % (ref 0.0–0.2)

## 2023-03-20 NOTE — Progress Notes (Signed)
  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  He feels well.  No bleeding or symptom of thrombosis.  No pruritus.  He is exercising.  He reports decreasing alcohol consumption.  No complaint  Objective:  Vital signs in last 24 hours:  Blood pressure (!) 142/78, pulse 60, temperature 98.2 F (36.8 C), temperature source Oral, resp. rate 18, height 6' (1.829 m), weight 246 lb (111.6 kg), SpO2 98 %.    Resp: Lungs clear bilaterally Cardio: Regular rate and rhythm GI: No hepatosplenomegaly Vascular: No leg edema   Lab Results:  Lab Results  Component Value Date   WBC 7.1 03/20/2023   HGB 18.0 (H) 03/20/2023   HCT 51.8 03/20/2023   MCV 93.7 03/20/2023   PLT 162 03/20/2023   NEUTROABS 4.7 03/20/2023    CMP  Lab Results  Component Value Date   NA 141 01/07/2023   K 4.9 01/07/2023   CL 103 01/07/2023   CO2 28 01/07/2023   GLUCOSE 171 (H) 01/07/2023   BUN 17 01/07/2023   CREATININE 1.18 01/07/2023   CALCIUM 9.9 01/07/2023   PROT 6.8 01/07/2023   ALBUMIN 4.8 01/07/2023   AST 19 01/07/2023   ALT 27 01/07/2023   ALKPHOS 48 01/07/2023   BILITOT 1.1 01/07/2023   GFRNONAA 72 06/27/2020   GFRAA 83 06/27/2020    Medications: I have reviewed the patient's current medications.   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  The hemoglobin and hematocrit have not changed significantly for several years.  He most likely has benign normal variation of the Red cell count or an effect of HCTZ.  He could have a degree of intravascular depletion from alcohol consumption, but he reports decreasing alcohol intake.  I have a low clinical suspicion for a primary hematologic diagnosis.  The plan is to continue observation.  He will  return for an office visit and CBC in 6 months.  [name], MD  03/20/2023  10:52 AM

## 2023-05-05 ENCOUNTER — Other Ambulatory Visit: Payer: Self-pay | Admitting: Internal Medicine

## 2023-05-30 ENCOUNTER — Encounter (INDEPENDENT_AMBULATORY_CARE_PROVIDER_SITE_OTHER): Payer: Self-pay

## 2023-06-06 DIAGNOSIS — N401 Enlarged prostate with lower urinary tract symptoms: Secondary | ICD-10-CM | POA: Diagnosis not present

## 2023-06-06 DIAGNOSIS — R351 Nocturia: Secondary | ICD-10-CM | POA: Diagnosis not present

## 2023-06-11 DIAGNOSIS — H35033 Hypertensive retinopathy, bilateral: Secondary | ICD-10-CM | POA: Diagnosis not present

## 2023-06-11 DIAGNOSIS — H59811 Chorioretinal scars after surgery for detachment, right eye: Secondary | ICD-10-CM | POA: Diagnosis not present

## 2023-06-11 DIAGNOSIS — E119 Type 2 diabetes mellitus without complications: Secondary | ICD-10-CM | POA: Diagnosis not present

## 2023-06-11 LAB — HM DIABETES EYE EXAM

## 2023-07-07 ENCOUNTER — Encounter: Payer: Self-pay | Admitting: Internal Medicine

## 2023-07-07 NOTE — Patient Instructions (Addendum)
      Blood work was ordered.   The lab is on the first floor.    Medications changes include :   none      Return in about 6 months (around 01/05/2024) for Physical Exam.

## 2023-07-07 NOTE — Progress Notes (Signed)
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   MRN: [id number]     HPI [name] is here for follow up of his chronic medical problems.  Feeling well.  Exercising regularly.  Sleeping good.    Medications and allergies reviewed with patient and updated if appropriate.  Current Outpatient Medications on File Prior to Visit  Medication Sig Dispense Refill   acetaminophen (TYLENOL 8 HOUR) 650 MG CR tablet Take 650 mg by mouth every 8 (eight) hours as needed for pain.     aspirin EC 81 MG tablet Take 1 tablet (81 mg total) by mouth daily. Swallow whole. 90 tablet 3   augmented betamethasone dipropionate (DIPROLENE-AF) 0.05 % cream Apply 1 application topically as needed (iritation).      co-enzyme Q-10 30 MG capsule Take 30 mg by mouth daily.     hydrochlorothiazide (HYDRODIURIL) 25 MG tablet Take 1 tablet (25 mg total) by mouth daily. 90 tablet 3   JARDIANCE 25 MG TABS tablet TAKE 1 TABLET(25 MG) BY MOUTH DAILY BEFORE BREAKFAST 90 tablet 1   losartan (COZAAR) 100 MG tablet TAKE 1 TABLET(100 MG) BY MOUTH DAILY 90 tablet 1   metFORMIN (GLUCOPHAGE) 1000 MG tablet TAKE 1 TABLET(1000 MG) BY MOUTH TWICE DAILY WITH A MEAL 180 tablet 1   metoprolol tartrate (LOPRESSOR) 25 MG tablet TAKE 1 TABLET(25 MG) BY MOUTH TWICE DAILY 180 tablet 1   ONE TOUCH ULTRA TEST test strip TEST UP TO FOUR TIMES DAILY AS DIRECTED 300 each 5   OneTouch Delica Lancets 33G MISC TEST UP TO FOUR TIMES DAILY AS DIRECTED 100 each 2   rosuvastatin (CRESTOR) 40 MG tablet TAKE 1 TABLET(40 MG) BY MOUTH DAILY 90 tablet 3   tamsulosin (FLOMAX) 0.4 MG CAPS capsule Take 1 capsule (0.4 mg total) by mouth daily. 90 capsule 1   Zinc 25 MG TABS Take 25 mg by mouth daily.     amLODipine (NORVASC) 2.5 MG tablet Take 1 tablet (2.5 mg total) by mouth daily. 180 tablet 3   No current facility-administered medications on file prior to visit.     Review of Systems  Constitutional:  Negative for  fever.  Respiratory:  Negative for cough, shortness of breath and wheezing.   Cardiovascular:  Negative for chest pain, palpitations and leg swelling.  Neurological:  Negative for light-headedness and headaches.       Objective:   Vitals:   07/08/23 0754  BP: 112/68  Pulse: (!) 50  Temp: 98.2 F (36.8 C)  SpO2: 95%   BP Readings from Last 3 Encounters:  07/08/23 112/68  03/20/23 (!) 142/78  02/21/23 136/68   Wt Readings from Last 3 Encounters:  07/08/23 244 lb (110.7 kg)  03/20/23 246 lb (111.6 kg)  03/12/23 247 lb (112 kg)   Body mass index is 33.09 kg/m.    Physical Exam Constitutional:      General: He is not in acute distress.    Appearance: Normal appearance. He is not ill-appearing.  HENT:     Head: Normocephalic and atraumatic.  Eyes:     Conjunctiva/sclera: Conjunctivae normal.  Cardiovascular:     Rate and Rhythm: Normal rate and regular rhythm.     Heart sounds: Normal heart sounds.  Pulmonary:     Effort: Pulmonary effort is normal. No respiratory distress.     Breath sounds: Normal breath sounds. No wheezing or rales.  Musculoskeletal:  Right lower leg: No edema.     Left lower leg: No edema.  Skin:    General: Skin is warm and dry.     Findings: No rash.  Neurological:     Mental Status: He is alert. Mental status is at baseline.  Psychiatric:        Mood and Affect: Mood normal.       Diabetic Foot Exam - Simple   Simple Foot Form Diabetic Foot exam was performed with the following findings: Yes 07/08/2023  8:12 AM  Visual Inspection No deformities, no ulcerations, no other skin breakdown bilaterally: Yes Sensation Testing Intact to touch and monofilament testing bilaterally: Yes Pulse Check Posterior Tibialis and Dorsalis pulse intact bilaterally: Yes Comments      Lab Results  Component Value Date   WBC 7.1 03/20/2023   HGB 18.0 (H) 03/20/2023   HCT 51.8 03/20/2023   PLT 162 03/20/2023   GLUCOSE 171 (H) 01/07/2023   CHOL  118 01/07/2023   TRIG 128.0 01/07/2023   HDL 38.70 (L) 01/07/2023   LDLCALC 54 01/07/2023   ALT 27 01/07/2023   AST 19 01/07/2023   NA 141 01/07/2023   K 4.9 01/07/2023   CL 103 01/07/2023   CREATININE 1.18 01/07/2023   BUN 17 01/07/2023   CO2 28 01/07/2023   TSH 2.61 01/07/2023   PSA 1.06 01/07/2023   INR 1.0 05/03/2008   HGBA1C 7.2 (H) 01/07/2023   MICROALBUR 2.0 (H) 01/07/2023     Assessment & Plan:    See Problem List for Assessment and Plan of chronic medical problems.

## 2023-07-08 ENCOUNTER — Ambulatory Visit (INDEPENDENT_AMBULATORY_CARE_PROVIDER_SITE_OTHER): Payer: PPO | Admitting: Internal Medicine

## 2023-07-08 ENCOUNTER — Telehealth: Payer: Self-pay

## 2023-07-08 VITALS — BP 112/68 | HR 50 | Temp 98.2°F | Ht 72.0 in | Wt 244.0 lb

## 2023-07-08 DIAGNOSIS — I10 Essential (primary) hypertension: Secondary | ICD-10-CM

## 2023-07-08 DIAGNOSIS — N1831 Chronic kidney disease, stage 3a: Secondary | ICD-10-CM

## 2023-07-08 DIAGNOSIS — Z125 Encounter for screening for malignant neoplasm of prostate: Secondary | ICD-10-CM | POA: Diagnosis not present

## 2023-07-08 DIAGNOSIS — Z7984 Long term (current) use of oral hypoglycemic drugs: Secondary | ICD-10-CM

## 2023-07-08 DIAGNOSIS — I2510 Atherosclerotic heart disease of native coronary artery without angina pectoris: Secondary | ICD-10-CM

## 2023-07-08 DIAGNOSIS — E1169 Type 2 diabetes mellitus with other specified complication: Secondary | ICD-10-CM | POA: Diagnosis not present

## 2023-07-08 DIAGNOSIS — E785 Hyperlipidemia, unspecified: Secondary | ICD-10-CM | POA: Diagnosis not present

## 2023-07-08 LAB — CBC WITH DIFFERENTIAL/PLATELET
Basophils Absolute: 0.1 10*3/uL (ref 0.0–0.1)
Basophils Relative: 0.8 % (ref 0.0–3.0)
Eosinophils Absolute: 0.2 10*3/uL (ref 0.0–0.7)
Eosinophils Relative: 2.7 % (ref 0.0–5.0)
HCT: 55.1 % — ABNORMAL HIGH (ref 39.0–52.0)
Hemoglobin: 18.6 g/dL (ref 13.0–17.0)
Lymphocytes Relative: 17.6 % (ref 12.0–46.0)
Lymphs Abs: 1.4 10*3/uL (ref 0.7–4.0)
MCHC: 33.7 g/dL (ref 30.0–36.0)
MCV: 97.7 fl (ref 78.0–100.0)
Monocytes Absolute: 0.7 10*3/uL (ref 0.1–1.0)
Monocytes Relative: 8.9 % (ref 3.0–12.0)
Neutro Abs: 5.7 10*3/uL (ref 1.4–7.7)
Neutrophils Relative %: 70 % (ref 43.0–77.0)
Platelets: 181 10*3/uL (ref 150.0–400.0)
RBC: 5.64 Mil/uL (ref 4.22–5.81)
RDW: 13.9 % (ref 11.5–15.5)
WBC: 8.2 10*3/uL (ref 4.0–10.5)

## 2023-07-08 LAB — LIPID PANEL
Cholesterol: 105 mg/dL (ref 0–200)
HDL: 37.4 mg/dL — ABNORMAL LOW (ref >39.00)
LDL Cholesterol: 39 mg/dL (ref 0–99)
NonHDL: 67.43
Total CHOL/HDL Ratio: 3
Triglycerides: 143 mg/dL (ref 0.0–149.0)
VLDL: 28.6 mg/dL (ref 0.0–40.0)

## 2023-07-08 LAB — COMPREHENSIVE METABOLIC PANEL
ALT: 22 U/L (ref 0–53)
AST: 18 U/L (ref 0–37)
Albumin: 4.6 g/dL (ref 3.5–5.2)
Alkaline Phosphatase: 44 U/L (ref 39–117)
BUN: 19 mg/dL (ref 6–23)
CO2: 26 mEq/L (ref 19–32)
Calcium: 9.7 mg/dL (ref 8.4–10.5)
Chloride: 103 mEq/L (ref 96–112)
Creatinine, Ser: 1.22 mg/dL (ref 0.40–1.50)
GFR: 56.25 mL/min — ABNORMAL LOW (ref >60.00)
Glucose, Bld: 152 mg/dL — ABNORMAL HIGH (ref 70–99)
Potassium: 4.3 mEq/L (ref 3.5–5.1)
Sodium: 139 mEq/L (ref 135–145)
Total Bilirubin: 1.3 mg/dL — ABNORMAL HIGH (ref 0.2–1.2)
Total Protein: 6.6 g/dL (ref 6.0–8.3)

## 2023-07-08 LAB — PSA, MEDICARE: PSA: 0.9 ng/ml (ref 0.10–4.00)

## 2023-07-08 LAB — HEMOGLOBIN A1C: Hgb A1c MFr Bld: 6.8 % — ABNORMAL HIGH (ref 4.6–6.5)

## 2023-07-08 NOTE — Assessment & Plan Note (Signed)
Chronic Blood pressure well controlled Continue losartan 100 mg daily, metoprolol 25 mg bid, hydrochlorothiazide 25 mg daily

## 2023-07-08 NOTE — Assessment & Plan Note (Signed)
Chronic Following with cardiology History of stent No symptoms consistent with angina Rosuvastatin 40 mg daily, ASA 81 mg, metoprolol 25 mg twice daily, Jardiance 25 mg daily

## 2023-07-08 NOTE — Telephone Encounter (Signed)
Stable-following with hematology/oncology

## 2023-07-08 NOTE — Assessment & Plan Note (Signed)
Chronic GFR slightly low  discussed importance of keeping sugar, BP well-controlled Healthy diet, regular exercise, weight loss advised Avoid NSAIDs Increase water intake CMP, CBC

## 2023-07-08 NOTE — Assessment & Plan Note (Signed)
Chronic Check lipid panel  Continue Crestor 40 mg daily Regular exercise and healthy diet encouraged

## 2023-07-08 NOTE — Assessment & Plan Note (Signed)
Chronic   Lab Results  Component Value Date   HGBA1C 7.2 (H) 01/07/2023   Sugars controlled Check A1c Continue jardiance 25 mg daily, metformin 1000 mg bid Stressed regular exercise, diabetic diet

## 2023-07-27 ENCOUNTER — Other Ambulatory Visit: Payer: Self-pay | Admitting: Internal Medicine

## 2023-09-03 ENCOUNTER — Other Ambulatory Visit: Payer: Self-pay | Admitting: Cardiovascular Disease

## 2023-09-19 ENCOUNTER — Inpatient Hospital Stay: Payer: PPO

## 2023-09-19 ENCOUNTER — Inpatient Hospital Stay: Payer: PPO | Attending: Oncology | Admitting: Oncology

## 2023-09-19 VITALS — BP 142/78 | HR 49 | Temp 97.9°F | Resp 18 | Ht 72.0 in | Wt 246.8 lb

## 2023-09-19 DIAGNOSIS — F1090 Alcohol use, unspecified, uncomplicated: Secondary | ICD-10-CM | POA: Insufficient documentation

## 2023-09-19 DIAGNOSIS — I2510 Atherosclerotic heart disease of native coronary artery without angina pectoris: Secondary | ICD-10-CM | POA: Diagnosis not present

## 2023-09-19 DIAGNOSIS — N400 Enlarged prostate without lower urinary tract symptoms: Secondary | ICD-10-CM | POA: Insufficient documentation

## 2023-09-19 DIAGNOSIS — I252 Old myocardial infarction: Secondary | ICD-10-CM | POA: Insufficient documentation

## 2023-09-19 DIAGNOSIS — D582 Other hemoglobinopathies: Secondary | ICD-10-CM

## 2023-09-19 DIAGNOSIS — I10 Essential (primary) hypertension: Secondary | ICD-10-CM | POA: Insufficient documentation

## 2023-09-19 DIAGNOSIS — E119 Type 2 diabetes mellitus without complications: Secondary | ICD-10-CM | POA: Diagnosis not present

## 2023-09-19 DIAGNOSIS — D751 Secondary polycythemia: Secondary | ICD-10-CM | POA: Diagnosis not present

## 2023-09-19 LAB — CBC WITH DIFFERENTIAL (CANCER CENTER ONLY)
Abs Immature Granulocytes: 0.11 10*3/uL — ABNORMAL HIGH (ref 0.00–0.07)
Basophils Absolute: 0.1 10*3/uL (ref 0.0–0.1)
Basophils Relative: 1 %
Eosinophils Absolute: 0.2 10*3/uL (ref 0.0–0.5)
Eosinophils Relative: 3 %
HCT: 55 % — ABNORMAL HIGH (ref 39.0–52.0)
Hemoglobin: 18.8 g/dL — ABNORMAL HIGH (ref 13.0–17.0)
Immature Granulocytes: 1 %
Lymphocytes Relative: 21 %
Lymphs Abs: 1.8 10*3/uL (ref 0.7–4.0)
MCH: 33 pg (ref 26.0–34.0)
MCHC: 34.2 g/dL (ref 30.0–36.0)
MCV: 96.5 fL (ref 80.0–100.0)
Monocytes Absolute: 0.7 10*3/uL (ref 0.1–1.0)
Monocytes Relative: 8 %
Neutro Abs: 5.6 10*3/uL (ref 1.7–7.7)
Neutrophils Relative %: 66 %
Platelet Count: 167 10*3/uL (ref 150–400)
RBC: 5.7 MIL/uL (ref 4.22–5.81)
RDW: 12.8 % (ref 11.5–15.5)
WBC Count: 8.5 10*3/uL (ref 4.0–10.5)
nRBC: 0 % (ref 0.0–0.2)

## 2023-09-19 NOTE — Progress Notes (Signed)
  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  He feels well.  No dyspnea.  No bleeding or symptom of thrombosis.  No pruritus.  He has approximately 2 alcohol drinks each night.  Objective:  Vital signs in last 24 hours:  Blood pressure (!) 142/78, pulse (!) 49, temperature 97.9 F (36.6 C), temperature source Temporal, resp. rate 18, height 6' (1.829 m), weight 246 lb 12.8 oz (111.9 kg), SpO2 98%.    Resp: Lungs clear bilaterally Cardio: Regular rate and rhythm GI: No hepatosplenomegaly Vascular: No leg edema   Lab Results:  Lab Results  Component Value Date   WBC 8.5 09/19/2023   HGB 18.8 (H) 09/19/2023   HCT 55.0 (H) 09/19/2023   MCV 96.5 09/19/2023   PLT 167 09/19/2023   NEUTROABS 5.6 09/19/2023    CMP  Lab Results  Component Value Date   NA 139 07/08/2023   K 4.3 07/08/2023   CL 103 07/08/2023   CO2 26 07/08/2023   GLUCOSE 152 (H) 07/08/2023   BUN 19 07/08/2023   CREATININE 1.22 07/08/2023   CALCIUM 9.7 07/08/2023   PROT 6.6 07/08/2023   ALBUMIN 4.6 07/08/2023   AST 18 07/08/2023   ALT 22 07/08/2023   ALKPHOS 44 07/08/2023   BILITOT 1.3 (H) 07/08/2023   GFRNONAA 72 06/27/2020   GFRAA 83 06/27/2020     Medications: I have reviewed the patient's current medications.   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  He has persistent mild erythrocytosis.  I have a low clinical suspicion for a myeloproliferative disorder.  The hemoglobin has not changed significantly over the past 9 months.  He does not have symptoms or other clinical findings of a myeloproliferative disorder.  The erythrocytosis could be a false erythrocytosis secondary to HCTZ.  He will discontinue HCTZ and return for a CBC in 1 month.  He could  also have a secondary erythrocytosis secondary to unrecognized underlying cardiopulmonary disease.  He will return for an office visit in 1 month.  [name], MD  09/19/2023  9:51 AM

## 2023-10-22 ENCOUNTER — Inpatient Hospital Stay: Payer: PPO | Attending: Oncology

## 2023-10-22 ENCOUNTER — Inpatient Hospital Stay: Payer: PPO | Admitting: Oncology

## 2023-10-22 ENCOUNTER — Encounter: Payer: Self-pay | Admitting: Internal Medicine

## 2023-10-22 VITALS — BP 133/79 | HR 87 | Temp 98.2°F | Resp 18 | Ht 72.0 in | Wt 245.7 lb

## 2023-10-22 DIAGNOSIS — D751 Secondary polycythemia: Secondary | ICD-10-CM | POA: Insufficient documentation

## 2023-10-22 DIAGNOSIS — E119 Type 2 diabetes mellitus without complications: Secondary | ICD-10-CM | POA: Insufficient documentation

## 2023-10-22 DIAGNOSIS — D582 Other hemoglobinopathies: Secondary | ICD-10-CM

## 2023-10-22 DIAGNOSIS — I10 Essential (primary) hypertension: Secondary | ICD-10-CM | POA: Diagnosis not present

## 2023-10-22 DIAGNOSIS — I252 Old myocardial infarction: Secondary | ICD-10-CM | POA: Insufficient documentation

## 2023-10-22 DIAGNOSIS — N400 Enlarged prostate without lower urinary tract symptoms: Secondary | ICD-10-CM | POA: Diagnosis not present

## 2023-10-22 DIAGNOSIS — F1090 Alcohol use, unspecified, uncomplicated: Secondary | ICD-10-CM | POA: Insufficient documentation

## 2023-10-22 DIAGNOSIS — I2510 Atherosclerotic heart disease of native coronary artery without angina pectoris: Secondary | ICD-10-CM | POA: Diagnosis not present

## 2023-10-22 LAB — CBC WITH DIFFERENTIAL (CANCER CENTER ONLY)
Abs Immature Granulocytes: 0.05 10*3/uL (ref 0.00–0.07)
Basophils Absolute: 0.1 10*3/uL (ref 0.0–0.1)
Basophils Relative: 1 %
Eosinophils Absolute: 0.2 10*3/uL (ref 0.0–0.5)
Eosinophils Relative: 2 %
HCT: 51.9 % (ref 39.0–52.0)
Hemoglobin: 18.2 g/dL — ABNORMAL HIGH (ref 13.0–17.0)
Immature Granulocytes: 1 %
Lymphocytes Relative: 20 %
Lymphs Abs: 1.5 10*3/uL (ref 0.7–4.0)
MCH: 32.9 pg (ref 26.0–34.0)
MCHC: 35.1 g/dL (ref 30.0–36.0)
MCV: 93.9 fL (ref 80.0–100.0)
Monocytes Absolute: 0.6 10*3/uL (ref 0.1–1.0)
Monocytes Relative: 8 %
Neutro Abs: 5.2 10*3/uL (ref 1.7–7.7)
Neutrophils Relative %: 68 %
Platelet Count: 159 10*3/uL (ref 150–400)
RBC: 5.53 MIL/uL (ref 4.22–5.81)
RDW: 12.3 % (ref 11.5–15.5)
WBC Count: 7.6 10*3/uL (ref 4.0–10.5)
nRBC: 0 % (ref 0.0–0.2)

## 2023-10-22 NOTE — Progress Notes (Signed)
 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   MRN: [id number]      HPI [name] is here for  Chief Complaint  Patient presents with   Cough    Dry cough x a few; Hx of walking pneumonia (doesn't feel bad) wants to get a chest xray     Cough, dry - started a couple of weeks ago.  It has gotten better.   He thought he had accumulated some fluid in his chest.  Has h/o walking PNA in past so wanted to be evaluated.     His positional changes are not as balanced as they used to be.  With quick changes or standing up he notices he has to stay still and balance himself.  Never fallen.   Exercises - does treadmill and weights, plays golf, does yard work.     Medications and allergies reviewed with patient and updated if appropriate.  Current Outpatient Medications on File Prior to Visit  Medication Sig Dispense Refill   acetaminophen (TYLENOL 8 HOUR) 650 MG CR tablet Take 650 mg by mouth every 8 (eight) hours as needed for pain.     aspirin  EC 81 MG tablet Take 1 tablet (81 mg total) by mouth daily. Swallow whole. 90 tablet 3   co-enzyme Q-10 30 MG capsule Take 30 mg by mouth daily.     JARDIANCE  25 MG TABS tablet TAKE 1 TABLET(25 MG) BY MOUTH DAILY BEFORE BREAKFAST 90 tablet 1   losartan  (COZAAR ) 100 MG tablet TAKE 1 TABLET(100 MG) BY MOUTH DAILY 90 tablet 1   metFORMIN  (GLUCOPHAGE ) 1000 MG tablet TAKE 1 TABLET(1000 MG) BY MOUTH TWICE DAILY WITH A MEAL 180 tablet 1   metoprolol  tartrate (LOPRESSOR ) 25 MG tablet TAKE 1 TABLET(25 MG) BY MOUTH TWICE DAILY 180 tablet 1   ONE TOUCH ULTRA TEST test strip TEST UP TO FOUR TIMES DAILY AS DIRECTED 300 each 5   OneTouch Delica Lancets 33G MISC TEST UP TO FOUR TIMES DAILY AS DIRECTED 100 each 2   rosuvastatin  (CRESTOR ) 40 MG tablet TAKE 1 TABLET(40 MG) BY MOUTH DAILY 90 tablet 1   tamsulosin  (FLOMAX ) 0.4 MG CAPS capsule Take 1 capsule (0.4 mg total) by mouth daily. 90 capsule 1   Zinc 25 MG TABS Take 25 mg by  mouth daily.     amLODipine  (NORVASC ) 2.5 MG tablet Take 1 tablet (2.5 mg total) by mouth daily. 180 tablet 3   No current facility-administered medications on file prior to visit.    Review of Systems  Constitutional:  Negative for fever.  HENT:  Negative for congestion, ear pain, sinus pain and sore throat.   Respiratory:  Positive for cough (dry). Negative for shortness of breath and wheezing.   Cardiovascular:  Negative for chest pain.  Musculoskeletal:  Positive for back pain (chronic, intermittent - mild).  Neurological:  Negative for light-headedness, numbness and headaches.       Objective:   Vitals:   10/23/23 0758  BP: 120/68  Pulse: 60  Temp: 98.1 F (36.7 C)  SpO2: 94%   BP Readings from Last 3 Encounters:  10/23/23 120/68  10/22/23 133/79  09/19/23 (!) 142/78   Wt Readings from Last 3 Encounters:  10/23/23 243 lb (110.2 kg)  10/22/23 245 lb 11.2 oz (111.4 kg)  09/19/23 246 lb 12.8 oz (111.9 kg)   Body mass index is 32.96 kg/m.    Physical Exam Constitutional:  General: He is not in acute distress.    Appearance: Normal appearance. He is not ill-appearing.  HENT:     Head: Normocephalic.     Mouth/Throat:     Mouth: Mucous membranes are moist.     Pharynx: No oropharyngeal exudate or posterior oropharyngeal erythema.  Eyes:     Conjunctiva/sclera: Conjunctivae normal.  Cardiovascular:     Rate and Rhythm: Normal rate and regular rhythm.  Pulmonary:     Effort: Pulmonary effort is normal. No respiratory distress.     Breath sounds: Normal breath sounds. No wheezing or rales.  Musculoskeletal:     Cervical back: Neck supple. No tenderness.  Lymphadenopathy:     Cervical: No cervical adenopathy.  Skin:    General: Skin is warm and dry.     Findings: No rash.  Neurological:     Mental Status: He is alert.            Assessment & Plan:    See Problem List for Assessment and Plan of chronic medical problems.

## 2023-10-22 NOTE — Patient Instructions (Addendum)
     Have a chest xray downstairs    Medications changes include :   None

## 2023-10-22 NOTE — Progress Notes (Signed)
  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  He feels well.  Good appetite.  No bleeding or symptom of thrombosis.  No pruritus.  He reports an intermittent nonproductive cough for the past few weeks.  No dyspnea or fever.  He is exercising.  He is scheduled to see his primary provider this week. He reports drinking several 2-3 liquor drinks most nights. Objective:  Vital signs in last 24 hours:  Blood pressure 133/79, pulse 87, temperature 98.2 F (36.8 C), temperature source Temporal, resp. rate 18, height 6' (1.829 m), weight 245 lb 11.2 oz (111.4 kg), SpO2 98%.    Lymphatics: No cervical or supraclavicular nodes Resp: Lungs clear bilaterally Cardio: Regular rate and rhythm GI: No hepatosplenomegaly Vascular: No leg edema  Lab Results:  Lab Results  Component Value Date   WBC 7.6 10/22/2023   HGB 18.2 (H) 10/22/2023   HCT 51.9 10/22/2023   MCV 93.9 10/22/2023   PLT 159 10/22/2023   NEUTROABS 5.2 10/22/2023    CMP  Lab Results  Component Value Date   NA 139 07/08/2023   K 4.3 07/08/2023   CL 103 07/08/2023   CO2 26 07/08/2023   GLUCOSE 152 (H) 07/08/2023   BUN 19 07/08/2023   CREATININE 1.22 07/08/2023   CALCIUM  9.7 07/08/2023   PROT 6.6 07/08/2023   ALBUMIN 4.6 07/08/2023   AST 18 07/08/2023   ALT 22 07/08/2023   ALKPHOS 44 07/08/2023   BILITOT 1.3 (H) 07/08/2023   GFRNONAA 72 06/27/2020   GFRAA 83 06/27/2020    Medications: I have reviewed the patient's current medications.   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  The hemoglobin is slightly lower compared to when he was here last month.  It is possible the HCTZ is contributing partially to the erythrocytosis .  I continue to have  a low clinical suspicion for a myeloproliferative disorder.  The hemoglobin has been mildly elevated on intermittent determinations for several years.  There has not been a progressive rise in the hemoglobin.  I do not recommend phlebotomy therapy at present.  He will return for an office visit, CBC, and erythropoietin  level in 6 months.  He will follow-up with Dr. Geofm to evaluate the cough.  Arley Hof, MD  10/22/2023  12:02 PM

## 2023-10-23 ENCOUNTER — Ambulatory Visit (INDEPENDENT_AMBULATORY_CARE_PROVIDER_SITE_OTHER): Payer: PPO

## 2023-10-23 ENCOUNTER — Ambulatory Visit: Payer: PPO | Admitting: Internal Medicine

## 2023-10-23 VITALS — BP 120/68 | HR 60 | Temp 98.1°F | Ht 72.0 in | Wt 243.0 lb

## 2023-10-23 DIAGNOSIS — R2689 Other abnormalities of gait and mobility: Secondary | ICD-10-CM

## 2023-10-23 DIAGNOSIS — R059 Cough, unspecified: Secondary | ICD-10-CM | POA: Diagnosis not present

## 2023-10-23 DIAGNOSIS — R051 Acute cough: Secondary | ICD-10-CM

## 2023-10-23 DIAGNOSIS — I10 Essential (primary) hypertension: Secondary | ICD-10-CM

## 2023-10-23 MED ORDER — IPRATROPIUM BROMIDE 0.06 % NA SOLN: 2.0000 | Freq: Four times a day (QID) | NASAL | 12 refills | Status: AC

## 2023-10-23 NOTE — Assessment & Plan Note (Signed)
 Acute Started about 2 weeks ago-dry in nature without any concerning symptoms Cough has improved Likely viral URI Has a history of walking pneumonia and concerned about that Doubt pneumonia since he is feeling better, but will go ahead and get a chest x-ray today Symptomatic treatment

## 2023-10-23 NOTE — Assessment & Plan Note (Signed)
 States poor balance Discussed part of this could be related to aging, but encouraged him to start doing balance exercises-they have a class at the gym he can take Continue walking, doing weights, but needs to focus more balance Will follow-up at his upcoming appointment in a couple months-if balance is progressively getting worse we will consider neurology evaluation

## 2023-10-23 NOTE — Assessment & Plan Note (Signed)
Chronic Blood pressure well controlled Continue losartan 100 mg daily, metoprolol 25 mg bid, hydrochlorothiazide 25 mg daily

## 2023-10-28 ENCOUNTER — Other Ambulatory Visit: Payer: Self-pay | Admitting: Internal Medicine

## 2023-11-01 ENCOUNTER — Other Ambulatory Visit: Payer: Self-pay | Admitting: *Deleted

## 2023-11-01 DIAGNOSIS — D751 Secondary polycythemia: Secondary | ICD-10-CM

## 2023-12-09 ENCOUNTER — Telehealth: Payer: Self-pay

## 2023-12-09 NOTE — Telephone Encounter (Signed)
 Copied from CRM [id number]. Topic: Clinical - Medical Advice >> Dec 09, 2023  9:11 AM Elizebeth Brooking wrote:  Reason for CRM: Patient called stating he would like to speak with a nurse, stated that he has congestion and sinus in his chest, and would like a callback regarding this issue

## 2023-12-11 NOTE — Telephone Encounter (Signed)
 Called and spoke with patient. Offered them an appointment and they declined. Patient did note that symptoms seemed to be getting better and that they are currently treating them with mucinex. Patient is also drinking plenty of fluids.

## 2023-12-24 ENCOUNTER — Ambulatory Visit (INDEPENDENT_AMBULATORY_CARE_PROVIDER_SITE_OTHER)

## 2023-12-24 ENCOUNTER — Ambulatory Visit (INDEPENDENT_AMBULATORY_CARE_PROVIDER_SITE_OTHER): Admitting: Internal Medicine

## 2023-12-24 ENCOUNTER — Encounter: Payer: Self-pay | Admitting: Internal Medicine

## 2023-12-24 VITALS — BP 114/72 | HR 54 | Temp 98.1°F | Ht 72.0 in | Wt 272.0 lb

## 2023-12-24 DIAGNOSIS — M545 Low back pain, unspecified: Secondary | ICD-10-CM

## 2023-12-24 DIAGNOSIS — I700 Atherosclerosis of aorta: Secondary | ICD-10-CM | POA: Diagnosis not present

## 2023-12-24 DIAGNOSIS — G8929 Other chronic pain: Secondary | ICD-10-CM | POA: Diagnosis not present

## 2023-12-24 DIAGNOSIS — M47817 Spondylosis without myelopathy or radiculopathy, lumbosacral region: Secondary | ICD-10-CM | POA: Diagnosis not present

## 2023-12-24 DIAGNOSIS — M47816 Spondylosis without myelopathy or radiculopathy, lumbar region: Secondary | ICD-10-CM | POA: Diagnosis not present

## 2023-12-24 MED ORDER — PREDNISONE 10 MG PO TABS: 30.0000 mg | ORAL_TABLET | Freq: Every day | ORAL | 0 refills | Status: AC

## 2023-12-24 NOTE — Progress Notes (Signed)
 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   MRN: [id number]      HPI Kainan is here for  Chief Complaint  Patient presents with   Back Pain    Patient wants referral to Neuro; Patient also wants script for Azithromycin, wants to discuss Nabumetone and to see if he can have some sent in today    Back pain - chronic and intermittent.  Pain bad over the past week.  Pain is across the lower back w/o radiation down his legs. Balance is so- so. No N/T.  No leg weakness. It is hard to get out of bed and out of a chair, but once he starts walking  - gets better the more he walks.  When he stands he has to stand up and stabilize.  He has not exercised recently due to fear of his back of it locking up or giving way on him.  He would like to see neurosurgery - dr cram-his wife saw him previously.  He has seen orthopedics in the past but it has been years.  At the time they did not advise doing anything.  He is not sure if an injection or something else would help.   Takes tylenol, occ naprosyn.    Walks on treadmill, but not much recently.    A friend takes nabumentone and that works for him and he was wondering if that was something he could take.    Medications and allergies reviewed with patient and updated if appropriate.  Current Outpatient Medications on File Prior to Visit  Medication Sig Dispense Refill   acetaminophen (TYLENOL 8 HOUR) 650 MG CR tablet Take 650 mg by mouth every 8 (eight) hours as needed for pain.     aspirin EC 81 MG tablet Take 1 tablet (81 mg total) by mouth daily. Swallow whole. 90 tablet 3   co-enzyme Q-10 30 MG capsule Take 30 mg by mouth daily.     ipratropium (ATROVENT) 0.06 % nasal spray Place 2 sprays into both nostrils 4 (four) times daily. 15 mL 12   JARDIANCE 25 MG TABS tablet TAKE 1 TABLET(25 MG) BY MOUTH DAILY BEFORE BREAKFAST 90 tablet 1   losartan (COZAAR) 100 MG tablet TAKE 1 TABLET(100 MG) BY MOUTH DAILY 90  tablet 1   metFORMIN (GLUCOPHAGE) 1000 MG tablet TAKE 1 TABLET(1000 MG) BY MOUTH TWICE DAILY WITH A MEAL 180 tablet 1   metoprolol tartrate (LOPRESSOR) 25 MG tablet TAKE 1 TABLET(25 MG) BY MOUTH TWICE DAILY 180 tablet 1   ONE TOUCH ULTRA TEST test strip TEST UP TO FOUR TIMES DAILY AS DIRECTED 300 each 5   OneTouch Delica Lancets 33G MISC TEST UP TO FOUR TIMES DAILY AS DIRECTED 100 each 2   rosuvastatin (CRESTOR) 40 MG tablet TAKE 1 TABLET(40 MG) BY MOUTH DAILY 90 tablet 1   tamsulosin (FLOMAX) 0.4 MG CAPS capsule Take 1 capsule (0.4 mg total) by mouth daily. 90 capsule 1   Zinc 25 MG TABS Take 25 mg by mouth daily.     amLODipine (NORVASC) 2.5 MG tablet Take 1 tablet (2.5 mg total) by mouth daily. 180 tablet 3   No current facility-administered medications on file prior to visit.    Review of Systems     Objective:   Vitals:   12/24/23 1518  BP: 114/72  Pulse: (!) 54  Temp: 98.1 F (36.7 C)  SpO2: 93%   BP  Readings from Last 3 Encounters:  12/24/23 114/72  10/23/23 120/68  10/22/23 133/79   Wt Readings from Last 3 Encounters:  12/24/23 272 lb (123.4 kg)  10/23/23 243 lb (110.2 kg)  10/22/23 245 lb 11.2 oz (111.4 kg)   Body mass index is 36.89 kg/m.    Physical Exam Constitutional:      General: He is not in acute distress.    Appearance: Normal appearance. He is not ill-appearing.  HENT:     Head: Normocephalic and atraumatic.  Musculoskeletal:        General: No tenderness (lumbar spine, SI joints, across lower back).     Right lower leg: No edema.     Left lower leg: No edema.  Skin:    General: Skin is warm and dry.     Findings: No rash.  Neurological:     Mental Status: He is alert.     Sensory: No sensory deficit.     Motor: No weakness.            Assessment & Plan:    See Problem List for Assessment and Plan of chronic medical problems.

## 2023-12-24 NOTE — Patient Instructions (Addendum)
      Have an xray downstairs.     Medications changes include :   prednisone 30 mg daily for 5 days.     A referral was ordered for Dr Wynetta Emery and someone will call you to schedule an appointment.

## 2023-12-24 NOTE — Assessment & Plan Note (Signed)
 Acute on chronic intermittent low back pain Acute pain over the past week or so Pain in middle lower back w/o radiation.  No n/t or leg weakness Pain when he first stands - improves with walking Okay to continue Tylenol Stop Naprosyn secondary to CAD and CKD Prednisone 30 mg daily x 5 days-sugars are well-controlled, but discussed this may elevate sugars briefly Referral to Dr. Thornell Sartorius of lumbar spine

## 2024-01-09 ENCOUNTER — Encounter: Payer: Self-pay | Admitting: Internal Medicine

## 2024-01-12 ENCOUNTER — Encounter: Payer: Self-pay | Admitting: Internal Medicine

## 2024-01-12 NOTE — Progress Notes (Unsigned)
 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   MRN: [id number]     HPI [name] is here for a physical exam and his chronic medical problems.   Balance is not as good.  Gets wobbly at times.  Notices it getting off and on machines at the gym, doing yard work at times.  Has not started balance exercises.  Thinking about getting a trainer.     Medications and allergies reviewed with patient and updated if appropriate.  Current Outpatient Medications on File Prior to Visit  Medication Sig Dispense Refill   acetaminophen (TYLENOL 8 HOUR) 650 MG CR tablet Take 650 mg by mouth every 8 (eight) hours as needed for pain.     aspirin EC 81 MG tablet Take 1 tablet (81 mg total) by mouth daily. Swallow whole. 90 tablet 3   co-enzyme Q-10 30 MG capsule Take 30 mg by mouth daily.     ipratropium (ATROVENT) 0.06 % nasal spray Place 2 sprays into both nostrils 4 (four) times daily. 15 mL 12   JARDIANCE 25 MG TABS tablet TAKE 1 TABLET(25 MG) BY MOUTH DAILY BEFORE BREAKFAST 90 tablet 1   losartan (COZAAR) 100 MG tablet TAKE 1 TABLET(100 MG) BY MOUTH DAILY 90 tablet 1   metFORMIN (GLUCOPHAGE) 1000 MG tablet TAKE 1 TABLET(1000 MG) BY MOUTH TWICE DAILY WITH A MEAL 180 tablet 1   metoprolol tartrate (LOPRESSOR) 25 MG tablet TAKE 1 TABLET(25 MG) BY MOUTH TWICE DAILY 180 tablet 1   ONE TOUCH ULTRA TEST test strip TEST UP TO FOUR TIMES DAILY AS DIRECTED 300 each 5   OneTouch Delica Lancets 33G MISC TEST UP TO FOUR TIMES DAILY AS DIRECTED 100 each 2   rosuvastatin (CRESTOR) 40 MG tablet TAKE 1 TABLET(40 MG) BY MOUTH DAILY 90 tablet 1   tamsulosin (FLOMAX) 0.4 MG CAPS capsule Take 1 capsule (0.4 mg total) by mouth daily. 90 capsule 1   Zinc 25 MG TABS Take 25 mg by mouth daily.     amLODipine (NORVASC) 2.5 MG tablet Take 1 tablet (2.5 mg total) by mouth daily. 180 tablet 3   No current facility-administered medications on file prior to visit.    Review of Systems Review  of Systems  Constitutional:  Negative for fever.  HENT:  Positive for hearing loss (got hearing aids).   Eyes:  Negative for blurred vision.  Respiratory:  Negative for cough, shortness of breath and wheezing.   Cardiovascular:  Negative for chest pain, palpitations and leg swelling.  Gastrointestinal:  Negative for abdominal pain, blood in stool, constipation, diarrhea and heartburn.  Genitourinary:  Negative for dysuria.       No difficulty urinating  Musculoskeletal:  Positive for joint pain (mild). Negative for back pain (resolved).  Skin:  Negative for rash.  Neurological:  Negative for dizziness and headaches.       No lightheadedness  Psychiatric/Behavioral:  Negative for depression. The patient is not nervous/anxious.        Objective:   Vitals:   01/13/24 0749  BP: 118/68  Pulse: (!) 57  Temp: 97.6 F (36.4 C)  SpO2: 96%   Filed Weights   01/13/24 0749  Weight: 241 lb (109.3 kg)   Body mass index is 32.69 kg/m.  BP Readings from Last 3 Encounters:  01/13/24 118/68  12/24/23 114/72  10/23/23 120/68    Wt Readings from Last 3 Encounters:  01/13/24 241 lb (109.3  kg)  12/24/23 272 lb (123.4 kg)  10/23/23 243 lb (110.2 kg)      Physical Exam Constitutional: He appears well-developed and well-nourished. No distress.  HENT:  Head: Normocephalic and atraumatic.  Right Ear: External ear normal.  Left Ear: External ear normal.  Normal ear canals and TM b/l  Mouth/Throat: Oropharynx is clear and moist. Eyes: Conjunctivae and EOM are normal.  Neck: Neck supple. No tracheal deviation present. No thyromegaly present.  No carotid bruit  Cardiovascular: Normal rate, regular rhythm, normal heart sounds and intact distal pulses.   No murmur heard.  No lower extremity edema. Pulmonary/Chest: Effort normal and breath sounds normal. No respiratory distress. He has no wheezes. He has no rales.  Abdominal: Soft. He exhibits no distension. There is no tenderness.   Genitourinary: deferred  Lymphadenopathy:   He has no cervical adenopathy.  Skin: Skin is warm and dry. He is not diaphoretic.  Psychiatric: He has a normal mood and affect. His behavior is normal.         Assessment & Plan:   Physical exam: Screening blood work  ordered Exercise   regular - goes to gym, exercises at home, golf Weight  obese -  Substance abuse   none   Reviewed recommended immunizations.   Health Maintenance  Topic Date Due   Diabetic kidney evaluation - Urine ACR  01/07/2024   HEMOGLOBIN A1C  01/05/2024   Medicare Annual Wellness (AWV)  03/11/2024   COVID-19 Vaccine (6 - 2024-25 season) 01/28/2024 (Originally 06/16/2023)   OPHTHALMOLOGY EXAM  06/10/2024   Diabetic kidney evaluation - eGFR measurement  07/07/2024   FOOT EXAM  07/07/2024   DTaP/Tdap/Td (3 - Td or Tdap) 04/02/2032   Pneumonia Vaccine 68+ Years old  Completed   INFLUENZA VACCINE  Completed   Zoster Vaccines- Shingrix  Completed   HPV VACCINES  Aged Out   Colonoscopy  Discontinued   Hepatitis C Screening  Discontinued     See Problem List for Assessment and Plan of chronic medical problems.

## 2024-01-12 NOTE — Assessment & Plan Note (Signed)
 Chronic Stable, mild On jaridance, losartan discussed importance of keeping sugar, BP well-controlled Healthy diet, regular exercise, weight loss advised Avoid NSAIDs Increase water intake CMP, CBC

## 2024-01-12 NOTE — Patient Instructions (Addendum)
 Blood work was ordered.       Medications changes include :   None    A referral was ordered and someone will call you to schedule an appointment.     Return in about 6 months (around 07/14/2024) for follow up.   Health Maintenance, Male Adopting a healthy lifestyle and getting preventive care are important in promoting health and wellness. Ask your health care provider about: The right schedule for you to have regular tests and exams. Things you can do on your own to prevent diseases and keep yourself healthy. What should I know about diet, weight, and exercise? Eat a healthy diet  Eat a diet that includes plenty of vegetables, fruits, low-fat dairy products, and lean protein. Do not eat a lot of foods that are high in solid fats, added sugars, or sodium. Maintain a healthy weight Body mass index (BMI) is a measurement that can be used to identify possible weight problems. It estimates body fat based on height and weight. Your health care provider can help determine your BMI and help you achieve or maintain a healthy weight. Get regular exercise Get regular exercise. This is one of the most important things you can do for your health. Most adults should: Exercise for at least 150 minutes each week. The exercise should increase your heart rate and make you sweat (moderate-intensity exercise). Do strengthening exercises at least twice a week. This is in addition to the moderate-intensity exercise. Spend less time sitting. Even light physical activity can be beneficial. Watch cholesterol and blood lipids Have your blood tested for lipids and cholesterol at 81 years of age, then have this test every 5 years. You may need to have your cholesterol levels checked more often if: Your lipid or cholesterol levels are high. You are older than 81 years of age. You are at high risk for heart disease. What should I know about cancer screening? Many types of cancers can be detected  early and may often be prevented. Depending on your health history and family history, you may need to have cancer screening at various ages. This may include screening for: Colorectal cancer. Prostate cancer. Skin cancer. Lung cancer. What should I know about heart disease, diabetes, and high blood pressure? Blood pressure and heart disease High blood pressure causes heart disease and increases the risk of stroke. This is more likely to develop in people who have high blood pressure readings or are overweight. Talk with your health care provider about your target blood pressure readings. Have your blood pressure checked: Every 3-5 years if you are 26-73 years of age. Every year if you are 47 years old or older. If you are between the ages of 17 and 39 and are a current or former smoker, ask your health care provider if you should have a one-time screening for abdominal aortic aneurysm (AAA). Diabetes Have regular diabetes screenings. This checks your fasting blood sugar level. Have the screening done: Once every three years after age 66 if you are at a normal weight and have a low risk for diabetes. More often and at a younger age if you are overweight or have a high risk for diabetes. What should I know about preventing infection? Hepatitis B If you have a higher risk for hepatitis B, you should be screened for this virus. Talk with your health care provider to find out if you are at risk for hepatitis B infection. Hepatitis C Blood testing is recommended for:  Everyone born from [birth year] through 1963. Anyone with known risk factors for hepatitis C. Sexually transmitted infections (STIs) You should be screened each year for STIs, including gonorrhea and chlamydia, if: You are sexually active and are younger than 81 years of age. You are older than 81 years of age and your health care provider tells you that you are at risk for this type of infection. Your sexual activity has changed since  you were last screened, and you are at increased risk for chlamydia or gonorrhea. Ask your health care provider if you are at risk. Ask your health care provider about whether you are at high risk for HIV. Your health care provider may recommend a prescription medicine to help prevent HIV infection. If you choose to take medicine to prevent HIV, you should first get tested for HIV. You should then be tested every 3 months for as long as you are taking the medicine. Follow these instructions at home: Alcohol use Do not drink alcohol if your health care provider tells you not to drink. If you drink alcohol: Limit how much you have to 0-2 drinks a day. Know how much alcohol is in your drink. In the [place] one drink equals one 12 oz bottle of beer (355 mL), one 5 oz glass of wine (148 mL), or one 1 oz glass of hard liquor (44 mL). Lifestyle Do not use any products that contain nicotine or tobacco. These products include cigarettes, chewing tobacco, and vaping devices, such as e-cigarettes. If you need help quitting, ask your health care provider. Do not use street drugs. Do not share needles. Ask your health care provider for help if you need support or information about quitting drugs. General instructions Schedule regular health, dental, and eye exams. Stay current with your vaccines. Tell your health care provider if: You often feel depressed. You have ever been abused or do not feel safe at home. Summary Adopting a healthy lifestyle and getting preventive care are important in promoting health and wellness. Follow your health care provider's instructions about healthy diet, exercising, and getting tested or screened for diseases. Follow your health care provider's instructions on monitoring your cholesterol and blood pressure. This information is not intended to replace advice given to you by your health care provider. Make sure you discuss any questions you have with your health care  provider. Document Revised: 02/20/2021 Document Reviewed: 02/20/2021 Elsevier Patient Education  2024 [employer].

## 2024-01-12 NOTE — Progress Notes (Incomplete)
 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   MRN: [id number]     HPI [name] is here for a physical exam and his chronic medical problems.      Medications and allergies reviewed with patient and updated if appropriate.  Current Outpatient Medications on File Prior to Visit  Medication Sig Dispense Refill  . acetaminophen (TYLENOL 8 HOUR) 650 MG CR tablet Take 650 mg by mouth every 8 (eight) hours as needed for pain.    Marland Kitchen amLODipine (NORVASC) 2.5 MG tablet Take 1 tablet (2.5 mg total) by mouth daily. 180 tablet 3  . aspirin EC 81 MG tablet Take 1 tablet (81 mg total) by mouth daily. Swallow whole. 90 tablet 3  . co-enzyme Q-10 30 MG capsule Take 30 mg by mouth daily.    Marland Kitchen ipratropium (ATROVENT) 0.06 % nasal spray Place 2 sprays into both nostrils 4 (four) times daily. 15 mL 12  . JARDIANCE 25 MG TABS tablet TAKE 1 TABLET(25 MG) BY MOUTH DAILY BEFORE BREAKFAST 90 tablet 1  . losartan (COZAAR) 100 MG tablet TAKE 1 TABLET(100 MG) BY MOUTH DAILY 90 tablet 1  . metFORMIN (GLUCOPHAGE) 1000 MG tablet TAKE 1 TABLET(1000 MG) BY MOUTH TWICE DAILY WITH A MEAL 180 tablet 1  . metoprolol tartrate (LOPRESSOR) 25 MG tablet TAKE 1 TABLET(25 MG) BY MOUTH TWICE DAILY 180 tablet 1  . ONE TOUCH ULTRA TEST test strip TEST UP TO FOUR TIMES DAILY AS DIRECTED 300 each 5  . OneTouch Delica Lancets 33G MISC TEST UP TO FOUR TIMES DAILY AS DIRECTED 100 each 2  . rosuvastatin (CRESTOR) 40 MG tablet TAKE 1 TABLET(40 MG) BY MOUTH DAILY 90 tablet 1  . tamsulosin (FLOMAX) 0.4 MG CAPS capsule Take 1 capsule (0.4 mg total) by mouth daily. 90 capsule 1  . Zinc 25 MG TABS Take 25 mg by mouth daily.     No current facility-administered medications on file prior to visit.    Review of Systems     Objective:  There were no vitals filed for this visit. There were no vitals filed for this visit. There is no height or weight on file to calculate BMI.  BP Readings from Last 3  Encounters:  12/24/23 114/72  10/23/23 120/68  10/22/23 133/79    Wt Readings from Last 3 Encounters:  12/24/23 272 lb (123.4 kg)  10/23/23 243 lb (110.2 kg)  10/22/23 245 lb 11.2 oz (111.4 kg)      Physical Exam Constitutional: He appears well-developed and well-nourished. No distress.  HENT:  Head: Normocephalic and atraumatic.  Right Ear: External ear normal.  Left Ear: External ear normal.  Normal ear canals and TM b/l  Mouth/Throat: Oropharynx is clear and moist. Eyes: Conjunctivae and EOM are normal.  Neck: Neck supple. No tracheal deviation present. No thyromegaly present.  No carotid bruit  Cardiovascular: Normal rate, regular rhythm, normal heart sounds and intact distal pulses.   No murmur heard.  No lower extremity edema. Pulmonary/Chest: Effort normal and breath sounds normal. No respiratory distress. He has no wheezes. He has no rales.  Abdominal: Soft. He exhibits no distension. There is no tenderness.  Genitourinary: deferred  Lymphadenopathy:   He has no cervical adenopathy.  Skin: Skin is warm and dry. He is not diaphoretic.  Psychiatric: He has a normal mood and affect. His behavior is normal.         Assessment & Plan:  Physical exam: Screening blood work  ordered Exercise    Weight   Substance abuse   none   Reviewed recommended immunizations.   Health Maintenance  Topic Date Due  . COVID-19 Vaccine (6 - 2024-25 season) 06/16/2023  . Diabetic kidney evaluation - Urine ACR  01/07/2024  . HEMOGLOBIN A1C  01/05/2024  . Medicare Annual Wellness (AWV)  03/11/2024  . OPHTHALMOLOGY EXAM  06/10/2024  . Diabetic kidney evaluation - eGFR measurement  07/07/2024  . FOOT EXAM  07/07/2024  . DTaP/Tdap/Td (3 - Td or Tdap) 04/02/2032  . Pneumonia Vaccine 21+ Years old  Completed  . INFLUENZA VACCINE  Completed  . Zoster Vaccines- Shingrix  Completed  . HPV VACCINES  Aged Out  . Colonoscopy  Discontinued  . Hepatitis C Screening  Discontinued      See Problem List for Assessment and Plan of chronic medical problems.

## 2024-01-13 ENCOUNTER — Telehealth: Payer: Self-pay

## 2024-01-13 ENCOUNTER — Encounter: Payer: Self-pay | Admitting: Internal Medicine

## 2024-01-13 ENCOUNTER — Ambulatory Visit (INDEPENDENT_AMBULATORY_CARE_PROVIDER_SITE_OTHER): Payer: PPO | Admitting: Internal Medicine

## 2024-01-13 VITALS — BP 118/68 | HR 57 | Temp 97.6°F | Ht 72.0 in | Wt 241.0 lb

## 2024-01-13 DIAGNOSIS — N1831 Chronic kidney disease, stage 3a: Secondary | ICD-10-CM | POA: Diagnosis not present

## 2024-01-13 DIAGNOSIS — E1169 Type 2 diabetes mellitus with other specified complication: Secondary | ICD-10-CM | POA: Diagnosis not present

## 2024-01-13 DIAGNOSIS — I2510 Atherosclerotic heart disease of native coronary artery without angina pectoris: Secondary | ICD-10-CM | POA: Diagnosis not present

## 2024-01-13 DIAGNOSIS — E785 Hyperlipidemia, unspecified: Secondary | ICD-10-CM

## 2024-01-13 DIAGNOSIS — I10 Essential (primary) hypertension: Secondary | ICD-10-CM | POA: Diagnosis not present

## 2024-01-13 DIAGNOSIS — N401 Enlarged prostate with lower urinary tract symptoms: Secondary | ICD-10-CM

## 2024-01-13 DIAGNOSIS — R2689 Other abnormalities of gait and mobility: Secondary | ICD-10-CM | POA: Diagnosis not present

## 2024-01-13 DIAGNOSIS — R350 Frequency of micturition: Secondary | ICD-10-CM | POA: Diagnosis not present

## 2024-01-13 DIAGNOSIS — Z0000 Encounter for general adult medical examination without abnormal findings: Secondary | ICD-10-CM

## 2024-01-13 LAB — MICROALBUMIN / CREATININE URINE RATIO
Creatinine,U: 97.6 mg/dL
Microalb Creat Ratio: 12 mg/g (ref 0.0–30.0)
Microalb, Ur: 1.2 mg/dL (ref 0.0–1.9)

## 2024-01-13 LAB — COMPREHENSIVE METABOLIC PANEL WITH GFR
ALT: 21 U/L (ref 0–53)
AST: 16 U/L (ref 0–37)
Albumin: 4.7 g/dL (ref 3.5–5.2)
Alkaline Phosphatase: 50 U/L (ref 39–117)
BUN: 21 mg/dL (ref 6–23)
CO2: 26 meq/L (ref 19–32)
Calcium: 9.7 mg/dL (ref 8.4–10.5)
Chloride: 103 meq/L (ref 96–112)
Creatinine, Ser: 1.16 mg/dL (ref 0.40–1.50)
GFR: 59.54 mL/min — ABNORMAL LOW (ref >60.00)
Glucose, Bld: 153 mg/dL — ABNORMAL HIGH (ref 70–99)
Potassium: 4.3 meq/L (ref 3.5–5.1)
Sodium: 140 meq/L (ref 135–145)
Total Bilirubin: 1 mg/dL (ref 0.2–1.2)
Total Protein: 7.1 g/dL (ref 6.0–8.3)

## 2024-01-13 LAB — CBC WITH DIFFERENTIAL/PLATELET
Basophils Absolute: 0.1 10*3/uL (ref 0.0–0.1)
Basophils Relative: 0.7 % (ref 0.0–3.0)
Eosinophils Absolute: 0.2 10*3/uL (ref 0.0–0.7)
Eosinophils Relative: 2 % (ref 0.0–5.0)
HCT: 53 % — ABNORMAL HIGH (ref 39.0–52.0)
Hemoglobin: 18.1 g/dL (ref 13.0–17.0)
Lymphocytes Relative: 17.6 % (ref 12.0–46.0)
Lymphs Abs: 1.4 10*3/uL (ref 0.7–4.0)
MCHC: 34.1 g/dL (ref 30.0–36.0)
MCV: 95.3 fl (ref 78.0–100.0)
Monocytes Absolute: 0.7 10*3/uL (ref 0.1–1.0)
Monocytes Relative: 8.6 % (ref 3.0–12.0)
Neutro Abs: 5.5 10*3/uL (ref 1.4–7.7)
Neutrophils Relative %: 71.1 % (ref 43.0–77.0)
Platelets: 161 10*3/uL (ref 150.0–400.0)
RBC: 5.56 Mil/uL (ref 4.22–5.81)
RDW: 13.5 % (ref 11.5–15.5)
WBC: 7.7 10*3/uL (ref 4.0–10.5)

## 2024-01-13 LAB — LIPID PANEL
Cholesterol: 102 mg/dL (ref 0–200)
HDL: 36.4 mg/dL — ABNORMAL LOW (ref >39.00)
LDL Cholesterol: 42 mg/dL (ref 0–99)
NonHDL: 65.11
Total CHOL/HDL Ratio: 3
Triglycerides: 118 mg/dL (ref 0.0–149.0)
VLDL: 23.6 mg/dL (ref 0.0–40.0)

## 2024-01-13 LAB — TSH: TSH: 2.3 u[IU]/mL (ref 0.35–5.50)

## 2024-01-13 LAB — HEMOGLOBIN A1C: Hgb A1c MFr Bld: 7.1 % — ABNORMAL HIGH (ref 4.6–6.5)

## 2024-01-13 LAB — VITAMIN B12: Vitamin B-12: 392 pg/mL (ref 211–911)

## 2024-01-13 NOTE — Assessment & Plan Note (Signed)
 Chronic With CAD, hyperlipidemia  Lab Results  Component Value Date   HGBA1C 6.8 (H) 07/08/2023   Sugars controlled Check A1c, urine microalbumin Continue jardiance 25 mg daily, metformin 1000 mg bid Stressed regular exercise, diabetic diet

## 2024-01-13 NOTE — Assessment & Plan Note (Signed)
Chronic Sees urology  Continue tamsulosin 0.4 mg daily

## 2024-01-13 NOTE — Assessment & Plan Note (Signed)
 Chronic Blood pressure well controlled CBC, CMP Continue amlodipine 2.5 mg daily losartan 100 mg daily, metoprolol 25 mg bid, hydrochlorothiazide 25 mg daily

## 2024-01-13 NOTE — Assessment & Plan Note (Signed)
 Chronic Check lipid panel, CMP, TSH Continue Crestor 40 mg daily Regular exercise and healthy diet encouraged

## 2024-01-13 NOTE — Assessment & Plan Note (Signed)
 States poor balance Check B12 Not always related to changes in position,  no spinning, no neuropathy Discussed part of this could be related to aging, but encouraged him to start doing balance exercises-make get a trainer Continue walking, doing weights, but needs to focus more balance Consider neuro eval or trial of vestibular PT

## 2024-01-13 NOTE — Telephone Encounter (Unsigned)
 Copied from CRM [id number]. Topic: Clinical - Lab/Test Results >> Jan 13, 2024  3:37 PM Alcus Dad wrote: Reason for CRM: Patient called for lab results. Informed patient of notes. Labs were stable. Didn't see any additional notes Please call patient back

## 2024-01-13 NOTE — Telephone Encounter (Signed)
 Noted-this is stable

## 2024-01-13 NOTE — Telephone Encounter (Signed)
 CRITICAL VALUE STICKER  CRITICAL VALUE: hemoglobin 18.1  RECEIVER (on-site recipient of call): Dahlia Client  DATE & TIME NOTIFIED: 01/13/24 at 10:09 am  MESSENGER (representative from lab): [email or web address]  MD NOTIFIED: Dr. Lawerance Bach

## 2024-01-13 NOTE — Assessment & Plan Note (Signed)
Chronic Following with cardiology History of stent No symptoms consistent with angina Rosuvastatin 40 mg daily, ASA 81 mg, metoprolol 25 mg twice daily, Jardiance 25 mg daily

## 2024-01-14 ENCOUNTER — Encounter: Payer: Self-pay | Admitting: Internal Medicine

## 2024-01-16 ENCOUNTER — Ambulatory Visit

## 2024-02-03 ENCOUNTER — Ambulatory Visit

## 2024-02-05 ENCOUNTER — Ambulatory Visit (INDEPENDENT_AMBULATORY_CARE_PROVIDER_SITE_OTHER)

## 2024-02-05 VITALS — Ht 72.0 in | Wt 241.0 lb

## 2024-02-05 DIAGNOSIS — Z0000 Encounter for general adult medical examination without abnormal findings: Secondary | ICD-10-CM | POA: Diagnosis not present

## 2024-02-05 NOTE — Progress Notes (Signed)
 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  Visit Complete: Virtual I connected with  [name], [name] on 02/05/24 by a audio enabled telemedicine application and verified that I am speaking with the correct person using two identifiers.  Patient Location: Home  Provider Location: Home Office  I discussed the limitations of evaluation and management by telemedicine. The patient expressed understanding and agreed to proceed.  Vital Signs: Because this visit was a virtual/telehealth visit, some criteria may be missing or patient reported. Any vitals not documented were not able to be obtained and vitals that have been documented are patient reported.  VideoDeclined- This patient declined Librarian, academic. Therefore the visit was completed with audio only.  Persons Participating in Visit: Patient.  AWV Questionnaire: No: Patient Medicare AWV questionnaire was not completed prior to this visit.  Cardiac Risk Factors include: advanced age (>38men, >85 women);hypertension;male gender;diabetes mellitus;Other (see comment);dyslipidemia, Risk factor comments: CAD, BPH, CKD     Objective:    Today's Vitals   02/05/24 1414  Weight: 241 lb (109.3 kg)  Height: 6' (1.829 m)   Body mass index is 32.69 kg/m.     02/05/2024    2:43 PM 10/22/2023   11:49 AM 09/19/2023    9:30 AM 03/20/2023   10:11 AM 03/12/2023    3:58 PM 02/06/2023   11:11 AM 05/15/2022    3:55 PM  Advanced Directives  Does Patient Have a Medical Advance Directive? Yes Yes Yes Yes Yes No Yes  Type of Estate agent of [place];Living will Healthcare Power of [place];Living will Healthcare Power of [place];Living will Healthcare Power of [place];Living will Healthcare Power of [place];Living will  Living will;Healthcare Power of Attorney  Does patient want to make changes to medical advance directive?  No - Patient  declined No - Patient declined No - Patient declined   No - Patient declined  Copy of Healthcare Power of Attorney in Chart? No - copy requested No - copy requested No - copy requested No - copy requested No - copy requested  No - copy requested  Would patient like information on creating a medical advance directive?      No - Patient declined     Current Medications (verified) Outpatient Encounter Medications as of 02/05/2024  Medication Sig   acetaminophen (TYLENOL 8 HOUR) 650 MG CR tablet Take 650 mg by mouth every 8 (eight) hours as needed for pain.   aspirin  EC 81 MG tablet Take 1 tablet (81 mg total) by mouth daily. Swallow whole.   co-enzyme Q-10 30 MG capsule Take 30 mg by mouth daily.   ipratropium (ATROVENT ) 0.06 % nasal spray Place 2 sprays into both nostrils 4 (four) times daily.   JARDIANCE  25 MG TABS tablet TAKE 1 TABLET(25 MG) BY MOUTH DAILY BEFORE BREAKFAST   losartan  (COZAAR ) 100 MG tablet TAKE 1 TABLET(100 MG) BY MOUTH DAILY   metFORMIN  (GLUCOPHAGE ) 1000 MG tablet TAKE 1 TABLET(1000 MG) BY MOUTH TWICE DAILY WITH A MEAL   metoprolol  tartrate (LOPRESSOR ) 25 MG tablet TAKE 1 TABLET(25 MG) BY MOUTH TWICE DAILY   ONE TOUCH ULTRA TEST test strip TEST UP TO FOUR TIMES DAILY AS DIRECTED   OneTouch Delica Lancets 33G MISC TEST UP TO FOUR TIMES DAILY AS DIRECTED   rosuvastatin  (CRESTOR ) 40 MG tablet TAKE 1 TABLET(40 MG) BY MOUTH DAILY   tamsulosin  (FLOMAX ) 0.4 MG CAPS capsule Take 1 capsule (  0.4 mg total) by mouth daily.   Zinc 25 MG TABS Take 25 mg by mouth daily.   amLODipine  (NORVASC ) 2.5 MG tablet Take 1 tablet (2.5 mg total) by mouth daily.   No facility-administered encounter medications on file as of 02/05/2024.    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 Initial MI @ 88   Diabetes Father    Stroke Father    COPD Mother    Diabetes Mother    Hypertension Brother        DM- CABG   Kidney failure Sister    Social History   Socioeconomic History   Marital status: Married    Spouse name: Devra Fontana   Number of children: 2   Years of education: Not on file   Highest education level: Not on file  Occupational History   Occupation: retired Environmental health practitioner: RETIRED  Tobacco Use   Smoking status: Former    Current packs/day: 0.00    Types: Cigarettes    Quit date: 10/15/1997    Years since quitting: 26.3   Smokeless tobacco: Never   Tobacco comments:    smoked 1961-1999, up to 1/2 ppd. Off 3-4 years 1961-1999. Cigar 3/week for 1 year  Vaping Use   Vaping status: Never Used  Substance and Sexual Activity   Alcohol use: Yes    Alcohol/week: 0.0 standard drinks of alcohol    Comment: socially  1-2/ day   Drug use: No   Sexual activity: Yes  Other Topics Concern   Not on file  Social History Narrative   Lives with wife/2025   Social Drivers of Health   Financial Resource Strain: Low Risk  (02/05/2024)   Overall Financial Resource Strain (CARDIA)    Difficulty of Paying Living Expenses: Not hard at all  Food Insecurity: No Food Insecurity (02/05/2024)   Hunger Vital Sign    Worried About Running Out of Food in the Last Year: Never true    Ran Out of Food in the Last Year: Never true  Transportation Needs: No Transportation Needs (02/05/2024)   PRAPARE -  Administrator, Civil Service (Medical): No    Lack of Transportation (Non-Medical): No  Physical Activity: Sufficiently Active (02/05/2024)   Exercise Vital Sign    Days of Exercise per Week: 4 days    Minutes of Exercise per Session: 40 min  Stress: No Stress Concern Present (02/05/2024)   [employer] of Occupational Health - Occupational Stress Questionnaire    Feeling of Stress : Not at all  Social Connections: Moderately Isolated (02/05/2024)   Social Connection and Isolation Panel [NHANES]    Frequency  of Communication with Friends and Family: More than three times a week    Frequency of Social Gatherings with Friends and Family: Three times a week    Attends Religious Services: Never    Active Member of Clubs or Organizations: No    Attends Banker Meetings: Never    Marital Status: Married    Tobacco Counseling Counseling given: Not Answered Tobacco comments: smoked 1961-1999, up to 1/2 ppd. Off 3-4 years 1961-1999. Cigar 3/week for 1 year    Clinical Intake:  Pre-visit preparation completed: Yes  Pain : No/denies pain     BMI - recorded: 32.69 Nutritional Status: BMI > 30  Obese Nutritional Risks: None Diabetes: Yes CBG done?: No Did pt. bring in CBG monitor from home?: No  Lab Results  Component Value Date   HGBA1C 7.1 (H) 01/13/2024   HGBA1C 6.8 (H) 07/08/2023   HGBA1C 7.2 (H) 01/07/2023     How often do you need to have someone help you when you read instructions, pamphlets, or other written materials from your doctor or pharmacy?: 1 - Never  Interpreter Needed?: No  Information entered by :: Demitria Hay, RMA   Activities of Daily Living     02/05/2024    2:15 PM 03/12/2023    4:00 PM  In your present state of health, do you have any difficulty performing the following activities:  Hearing? 1 1  Comment wears hearing aides   Vision? 0 0  Difficulty concentrating or making decisions? 0 0  Walking or climbing  stairs? 0 0  Dressing or bathing? 0 0  Doing errands, shopping? 0 0  Preparing Food and eating ? N N  Using the Toilet? N N  In the past six months, have you accidently leaked urine? N N  Do you have problems with loss of bowel control? N N  Managing your Medications? N N  Managing your Finances? N N  Housekeeping or managing your Housekeeping? N N    Patient Care Team: Colene Dauphin, MD as PCP - General (Internal Medicine) Arnoldo Lapping, MD as PCP - Cardiology (Cardiology) Princella Brooklyn, OD (Optometry)  Indicate any recent Medical Services you may have received from other than [facility] providers in the past year (date may be approximate).     Assessment:   This is a routine wellness examination for Criston.  Hearing/Vision screen Hearing Screening - Comments:: Wears hearing aides Vision Screening - Comments:: Wears eyeglasses   Goals Addressed   None    Depression Screen     02/05/2024    2:45 PM 01/13/2024    7:54 AM 12/24/2023    3:27 PM 10/23/2023    8:11 AM 07/08/2023    8:18 AM 03/12/2023    3:54 PM 02/21/2023    8:25 AM  PHQ 2/9 Scores  PHQ - 2 Score 0 1 1 2 2  0 0  PHQ- 9 Score 0   3 4 0 0    Fall Risk     02/05/2024    2:43 PM 12/24/2023    3:27 PM 10/23/2023    8:11 AM 07/08/2023    8:18 AM 03/12/2023    3:59 PM  Fall Risk   Falls in the past year? 0 0 0 0 0  Number falls in past yr: 0 0 0 0 0  Injury with Fall? 0 0 0  0  Risk for fall due to : No Fall Risks No Fall Risks No Fall Risks No Fall Risks No  Fall Risks  Follow up Falls prevention discussed;Falls evaluation completed Falls evaluation completed Falls evaluation completed Falls evaluation completed Falls prevention discussed;Falls evaluation completed    MEDICARE RISK AT HOME:  Medicare Risk at Home Any stairs in or around the home?: Yes (basement) If so, are there any without handrails?: Yes Home free of loose throw rugs in walkways, pet beds, electrical cords, etc?: Yes Adequate lighting in your home  to reduce risk of falls?: Yes Life alert?: No Use of a cane, walker or w/c?: No Grab bars in the bathroom?: Yes Shower chair or bench in shower?: Yes Elevated toilet seat or a handicapped toilet?: Yes  TIMED UP AND GO:  Was the test performed?  No  Cognitive Function: 6CIT completed        02/05/2024    2:54 PM 03/12/2023    3:55 PM 05/15/2022    4:14 PM  6CIT Screen  What Year? 0 points 0 points 0 points  What month? 0 points 0 points 0 points  What time? 0 points 0 points 0 points  Count back from 20 0 points 0 points 0 points  Months in reverse 0 points 2 points 0 points  Repeat phrase 0 points 2 points 0 points  Total Score 0 points 4 points 0 points    Immunizations Immunization History  Administered Date(s) Administered   Fluad Quad(high Dose 65+) 06/26/2020, 07/02/2022   Influenza Whole 09/13/2010, 07/18/2013   Influenza, High Dose Seasonal PF 07/06/2017, 06/18/2018, 06/03/2019, 06/14/2023   Influenza,inj,Quad PF,6+ Mos 07/06/2017   Influenza,inj,quad, With Preservative 10/15/2016   Influenza-Unspecified 07/10/2014, 07/12/2015, 07/19/2016, 07/06/2017, 01/29/2019, 06/15/2021   PFIZER(Purple Top)SARS-COV-2 Vaccination 10/28/2019, 11/16/2019, 07/17/2020, 01/27/2021   PNEUMOCOCCAL CONJUGATE-20 04/02/2022   Pfizer Covid-19 Vaccine Bivalent Booster 34yrs & up 04/02/2022   Pneumococcal Conjugate-13 07/19/2015   Pneumococcal Polysaccharide-23 10/19/2010   Tdap 05/28/2011, 04/02/2022   Zoster Recombinant(Shingrix) 07/06/2017, 11/22/2017   Zoster, Live 10/01/2003    Screening Tests Health Maintenance  Topic Date Due   COVID-19 Vaccine (6 - 2024-25 season) 06/16/2023   INFLUENZA VACCINE  05/15/2024   OPHTHALMOLOGY EXAM  06/10/2024   FOOT EXAM  07/07/2024   HEMOGLOBIN A1C  07/14/2024   Diabetic kidney evaluation - eGFR measurement  01/12/2025   Diabetic kidney evaluation - Urine ACR  01/12/2025   Medicare Annual Wellness (AWV)  02/04/2025   DTaP/Tdap/Td (3 - Td or  Tdap) 04/02/2032   Pneumonia Vaccine 84+ Years old  Completed   Zoster Vaccines- Shingrix  Completed   HPV VACCINES  Aged Out   Meningococcal B Vaccine  Aged Out   Colonoscopy  Discontinued   Hepatitis C Screening  Discontinued    Health Maintenance  Health Maintenance Due  Topic Date Due   COVID-19 Vaccine (6 - 2024-25 season) 06/16/2023   Health Maintenance Items Addressed: See Nurse Notes  Additional Screening:  Vision Screening: Recommended annual ophthalmology exams for early detection of glaucoma and other disorders of the eye.  Dental Screening: Recommended annual dental exams for proper oral hygiene  Community Resource Referral / Chronic Care Management: CRR required this visit?  No   CCM required this visit?  No     Plan:     I have personally reviewed and noted the following in the patient's chart:   Medical and social history Use of alcohol, tobacco or illicit drugs  Current medications and supplements including opioid prescriptions. Patient is not currently taking opioid prescriptions. Functional ability and status Nutritional status Physical activity Advanced directives List of  other physicians Hospitalizations, surgeries, and ER visits in previous 12 months Vitals Screenings to include cognitive, depression, and falls Referrals and appointments  In addition, I have reviewed and discussed with patient certain preventive protocols, quality metrics, and best practice recommendations. A written personalized care plan for preventive services as well as general preventive health recommendations were provided to patient.     Argusta Mcgann L Kyllian Clingerman, CMA   02/05/2024   After Visit Summary: (MyChart) Due to this being a telephonic visit, the after visit summary with patients personalized plan was offered to patient via MyChart   Notes: Please refer to Routing Comments.

## 2024-02-05 NOTE — Patient Instructions (Addendum)
 Mr. Hosier , Thank you for taking time to come for your Medicare Wellness Visit. I appreciate your ongoing commitment to your health goals. Please review the following plan we discussed and let me know if I can assist you in the future.   Referrals/Orders/Follow-Ups/Clinician Recommendations: It was nice talking with you today.  Keep up the good work.  This is a list of the screening recommended for you and due dates:  Health Maintenance  Topic Date Due   COVID-19 Vaccine (6 - 2024-25 season) 06/16/2023   Flu Shot  05/15/2024   Eye exam for diabetics  06/10/2024   Complete foot exam   07/07/2024   Hemoglobin A1C  07/14/2024   Yearly kidney function blood test for diabetes  01/12/2025   Yearly kidney health urinalysis for diabetes  01/12/2025   Medicare Annual Wellness Visit  02/04/2025   DTaP/Tdap/Td vaccine (3 - Td or Tdap) 04/02/2032   Pneumonia Vaccine  Completed   Zoster (Shingles) Vaccine  Completed   HPV Vaccine  Aged Out   Meningitis B Vaccine  Aged Out   Colon Cancer Screening  Discontinued   Hepatitis C Screening  Discontinued    Advanced directives: (Copy Requested) Please bring a copy of your health care power of attorney and living will to the office to be added to your chart at your convenience. You can mail to [facility] [address] [place], [place] [address] or email to [email or web address]  Next Medicare Annual Wellness Visit scheduled for next year: Yes

## 2024-02-13 DIAGNOSIS — M5440 Lumbago with sciatica, unspecified side: Secondary | ICD-10-CM | POA: Diagnosis not present

## 2024-02-16 NOTE — Progress Notes (Unsigned)
 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 Doing well from a cardiac perspective. Today, he denies symptoms of palpitations, chest pain, shortness of breath, orthopnea, PND, lower extremity edema, dizziness, or syncope. He's having problems with low back discomfort and balance. He is going to start working with physical therapy to improve his balance.  He specifically denies any lightheadedness, weak spells, or dizziness.  Current Medications: Current Meds  Medication Sig   acetaminophen (TYLENOL 8 HOUR) 650 MG CR tablet Take 650 mg by mouth every 8 (eight) hours as needed for pain.   aspirin  EC 81 MG tablet Take 1 tablet (81 mg total) by mouth daily. Swallow whole.   co-enzyme Q-10 30 MG capsule Take 30 mg by mouth daily.   ipratropium (ATROVENT ) 0.06 % nasal spray Place 2 sprays into both nostrils 4 (four) times daily. (Patient taking differently: Place 2 sprays into both nostrils as needed.)   JARDIANCE  25 MG TABS tablet TAKE 1 TABLET(25 MG) BY MOUTH DAILY BEFORE BREAKFAST   losartan  (COZAAR ) 100 MG tablet TAKE 1 TABLET(100 MG) BY MOUTH DAILY   metFORMIN  (GLUCOPHAGE ) 1000 MG tablet TAKE 1 TABLET(1000 MG) BY MOUTH TWICE DAILY WITH A MEAL   metoprolol  tartrate (LOPRESSOR ) 25  MG tablet TAKE 1 TABLET(25 MG) BY MOUTH TWICE DAILY   rosuvastatin  (CRESTOR ) 40 MG tablet TAKE 1 TABLET(40 MG) BY MOUTH DAILY   tamsulosin  (FLOMAX ) 0.4 MG CAPS capsule Take 1 capsule (0.4 mg total) by mouth daily.   Zinc 25 MG TABS Take 25 mg by mouth daily.   [DISCONTINUED] ONE TOUCH ULTRA TEST test strip TEST UP TO FOUR TIMES DAILY AS DIRECTED   [DISCONTINUED] OneTouch Delica Lancets 33G MISC TEST UP TO FOUR TIMES DAILY AS DIRECTED     Allergies:   Ramipril   ROS:   Please see the history of present illness.    All other systems reviewed and are negative.  EKGs/Labs/Other Studies Reviewed:    The following studies were reviewed today:     EKG:   EKG Interpretation Date/Time:  Monday Feb 17 2024 09:12:37 EDT Ventricular Rate:  66 PR Interval:    QRS Duration:  104 QT Interval:  390 QTC Calculation: 408 R Axis:   -80  Text Interpretation: sinus rhythm with mobitz 1 AV block Left axis deviation Incomplete right bundle branch block Inferior infarct (cited on or before 11-Jun-1999) Anterior infarct , age undetermined When compared with ECG of 10-Sep-2005 13:18, Current undetermined rhythm precludes rhythm comparison, needs review Anterior infarct is now Present Confirmed by Arnoldo Lapping [phone]) on 02/17/2024 9:25:44 AM    Recent Labs: 01/13/2024: ALT  21; BUN 21; Creatinine, Ser 1.16; Hemoglobin 18.1 Repeated and verified X2.; Platelets 161.0; Potassium 4.3; Sodium 140; TSH 2.30  Recent Lipid Panel    Component Value Date/Time   CHOL 102 01/13/2024 0825   CHOL 94 (L) 04/26/2017 1008   TRIG 118.0 01/13/2024 0825   HDL 36.40 (L) 01/13/2024 0825   HDL 32 (L) 04/26/2017 1008   CHOLHDL 3 01/13/2024 0825   VLDL 23.6 01/13/2024 0825   LDLCALC 42 01/13/2024 0825   LDLCALC 54 06/27/2020 1043     Risk Assessment/Calculations:                Physical Exam:    VS:  BP 126/70   Pulse 66   Ht 6' (1.829 m)   Wt 246 lb (111.6 kg)   SpO2 99%   BMI 33.36 kg/m     Wt  Readings from Last 3 Encounters:  02/17/24 246 lb (111.6 kg)  02/05/24 241 lb (109.3 kg)  01/13/24 241 lb (109.3 kg)     GEN:  Well nourished, well developed in no acute distress HEENT: Normal NECK: No JVD; No carotid bruits LYMPHATICS: No lymphadenopathy CARDIAC: RRR, no murmurs, rubs, gallops RESPIRATORY:  Clear to auscultation without rales, wheezing or rhonchi  ABDOMEN: Soft, non-tender, non-distended MUSCULOSKELETAL:  No edema; No deformity  SKIN: Warm and dry NEUROLOGIC:  Alert and oriented x 3 PSYCHIATRIC:  Normal affect   Assessment & Plan Coronary artery disease involving native coronary artery of native heart without angina pectoris Patient is stable without symptoms of angina.  He is treated with aspirin  for antiplatelet therapy, amlodipine  and metoprolol  for antianginal therapy, and rosuvastatin  at a high intensity dose of 40 mg daily. Essential hypertension Labs reviewed with normal renal function creatinine 1.16, potassium 4.3.  Well-controlled on amlodipine , losartan , and metoprolol . Hyperlipidemia LDL goal <70 Cholesterol is 102, LDL 42, normal transaminases.  Continue current therapy.  Treated with rosuvastatin  40 mg daily. First degree AV block No lightheadedness or syncope. Mobitz type 1 second degree atrioventricular block The patient appears to be asymptomatic but he does have some significant AV block identified on his twelve-lead EKG.  I recommended a 3-day ZIO monitor.  May have to discontinue his beta-blocker depending on the findings.     Medication Adjustments/Labs and Tests Ordered: Current medicines are reviewed at length with the patient today.  Concerns regarding medicines are outlined above.  Orders Placed This Encounter  Procedures   LONG TERM MONITOR (3-14 DAYS)   EKG 12-Lead   No orders of the defined types were placed in this encounter.   Patient Instructions  Testing/Procedures: Zio heart monitor x 3 days Your physician has recommended  that you wear an event monitor. Event monitors are medical devices that record the heart's electrical activity. Doctors most often us  these monitors to diagnose arrhythmias. Arrhythmias are problems with the speed or rhythm of the heartbeat. The monitor is a small, portable device. You can wear one while you do your normal daily activities. This is usually used to diagnose what is causing palpitations/syncope (passing out).  Follow-Up: At [facility], you and your health needs are our priority.  As part of our continuing mission to provide you with exceptional heart care, our providers are all part of one team.  This team includes your primary Cardiologist (physician) and Advanced Practice Providers or APPs (Physician Assistants and Nurse Practitioners) who all work together to provide you with the care you need, when you need it.  Your next appointment:  1 year(s)  Provider:   Arnoldo Lapping, MD        Signed, Arnoldo Lapping, MD  02/17/2024 9:42 AM    [facility]

## 2024-02-17 ENCOUNTER — Ambulatory Visit: Payer: PPO | Attending: Cardiovascular Disease | Admitting: Cardiovascular Disease

## 2024-02-17 ENCOUNTER — Ambulatory Visit

## 2024-02-17 ENCOUNTER — Encounter: Payer: Self-pay | Admitting: Cardiovascular Disease

## 2024-02-17 VITALS — BP 126/70 | HR 66 | Ht 72.0 in | Wt 246.0 lb

## 2024-02-17 DIAGNOSIS — E785 Hyperlipidemia, unspecified: Secondary | ICD-10-CM

## 2024-02-17 DIAGNOSIS — I2510 Atherosclerotic heart disease of native coronary artery without angina pectoris: Secondary | ICD-10-CM

## 2024-02-17 DIAGNOSIS — I441 Atrioventricular block, second degree: Secondary | ICD-10-CM

## 2024-02-17 DIAGNOSIS — I10 Essential (primary) hypertension: Secondary | ICD-10-CM

## 2024-02-17 DIAGNOSIS — I440 Atrioventricular block, first degree: Secondary | ICD-10-CM

## 2024-02-17 NOTE — Patient Instructions (Signed)
 Testing/Procedures: Zio heart monitor x 3 days Your physician has recommended that you wear an event monitor. Event monitors are medical devices that record the heart's electrical activity. Doctors most often us  these monitors to diagnose arrhythmias. Arrhythmias are problems with the speed or rhythm of the heartbeat. The monitor is a small, portable device. You can wear one while you do your normal daily activities. This is usually used to diagnose what is causing palpitations/syncope (passing out).  Follow-Up: At [facility], you and your health needs are our priority.  As part of our continuing mission to provide you with exceptional heart care, our providers are all part of one team.  This team includes your primary Cardiologist (physician) and Advanced Practice Providers or APPs (Physician Assistants and Nurse Practitioners) who all work together to provide you with the care you need, when you need it.  Your next appointment:   1 year(s)  Provider:   Arnoldo Lapping, MD

## 2024-02-17 NOTE — Assessment & Plan Note (Signed)
 Patient is stable without symptoms of angina.  He is treated with aspirin  for antiplatelet therapy, amlodipine  and metoprolol  for antianginal therapy, and rosuvastatin  at a high intensity dose of 40 mg daily.

## 2024-02-17 NOTE — Progress Notes (Unsigned)
Applied a 3 day Zio XT monitor to patient in the office 

## 2024-02-17 NOTE — Assessment & Plan Note (Addendum)
 Cholesterol is 102, LDL 42, normal transaminases.  Continue current therapy.  Treated with rosuvastatin  40 mg daily.

## 2024-02-17 NOTE — Assessment & Plan Note (Addendum)
 Labs reviewed with normal renal function creatinine 1.16, potassium 4.3.  Well-controlled on amlodipine , losartan , and metoprolol .

## 2024-02-17 NOTE — Assessment & Plan Note (Signed)
 No lightheadedness or syncope.

## 2024-02-19 DIAGNOSIS — R2681 Unsteadiness on feet: Secondary | ICD-10-CM | POA: Diagnosis not present

## 2024-02-19 DIAGNOSIS — M5440 Lumbago with sciatica, unspecified side: Secondary | ICD-10-CM | POA: Diagnosis not present

## 2024-02-24 DIAGNOSIS — R2681 Unsteadiness on feet: Secondary | ICD-10-CM | POA: Diagnosis not present

## 2024-02-24 DIAGNOSIS — M5440 Lumbago with sciatica, unspecified side: Secondary | ICD-10-CM | POA: Diagnosis not present

## 2024-02-26 ENCOUNTER — Telehealth: Payer: Self-pay | Admitting: Nurse Practitioner

## 2024-02-26 DIAGNOSIS — I440 Atrioventricular block, first degree: Secondary | ICD-10-CM | POA: Diagnosis not present

## 2024-02-26 DIAGNOSIS — I441 Atrioventricular block, second degree: Secondary | ICD-10-CM | POA: Diagnosis not present

## 2024-02-26 NOTE — Telephone Encounter (Signed)
   Received a call from [employer] re: recently returned Zio XT.  On 5/8 @ 4:15 AM, pt had an 8.9 second pause.  No symptoms reported.  Report to be sent to ordering provider for review.    Laneta Pintos, NP 02/26/2024, 7:16 PM

## 2024-02-27 ENCOUNTER — Ambulatory Visit: Payer: Self-pay | Admitting: Cardiovascular Disease

## 2024-02-27 DIAGNOSIS — M5440 Lumbago with sciatica, unspecified side: Secondary | ICD-10-CM | POA: Diagnosis not present

## 2024-02-27 DIAGNOSIS — I440 Atrioventricular block, first degree: Secondary | ICD-10-CM

## 2024-02-27 DIAGNOSIS — I441 Atrioventricular block, second degree: Secondary | ICD-10-CM

## 2024-02-27 DIAGNOSIS — R2681 Unsteadiness on feet: Secondary | ICD-10-CM | POA: Diagnosis not present

## 2024-03-02 ENCOUNTER — Other Ambulatory Visit: Payer: Self-pay | Admitting: Internal Medicine

## 2024-03-02 ENCOUNTER — Telehealth: Payer: Self-pay | Admitting: Cardiovascular Disease

## 2024-03-02 DIAGNOSIS — D2261 Melanocytic nevi of right upper limb, including shoulder: Secondary | ICD-10-CM | POA: Diagnosis not present

## 2024-03-02 DIAGNOSIS — L905 Scar conditions and fibrosis of skin: Secondary | ICD-10-CM | POA: Diagnosis not present

## 2024-03-02 DIAGNOSIS — M5440 Lumbago with sciatica, unspecified side: Secondary | ICD-10-CM | POA: Diagnosis not present

## 2024-03-02 DIAGNOSIS — R2681 Unsteadiness on feet: Secondary | ICD-10-CM | POA: Diagnosis not present

## 2024-03-02 DIAGNOSIS — Z85828 Personal history of other malignant neoplasm of skin: Secondary | ICD-10-CM | POA: Diagnosis not present

## 2024-03-02 DIAGNOSIS — L821 Other seborrheic keratosis: Secondary | ICD-10-CM | POA: Diagnosis not present

## 2024-03-02 DIAGNOSIS — L570 Actinic keratosis: Secondary | ICD-10-CM | POA: Diagnosis not present

## 2024-03-02 DIAGNOSIS — I440 Atrioventricular block, first degree: Secondary | ICD-10-CM

## 2024-03-02 DIAGNOSIS — L718 Other rosacea: Secondary | ICD-10-CM | POA: Diagnosis not present

## 2024-03-02 NOTE — Telephone Encounter (Signed)
 Pt requesting cb from Dr Arlester Ladd or Geralyn Knee weaver to discuss having a sleep study done. Has questions

## 2024-03-02 NOTE — Telephone Encounter (Signed)
 Spoke with pt who stated the friend of his that is a sleep specialist doesn't do at-home sleep studies. Dr. Arlester Ladd would like for the pt to have a sleep study performed per encounter from 5/15. [facility] sleep study order has been placed for the pt, pt is aware and awaiting call from our sleep study team in order to set-up picking the device up from our office.

## 2024-03-02 NOTE — Addendum Note (Signed)
 Addended by: Roxianne Coral on: 03/02/2024 10:39 AM   Modules accepted: Orders

## 2024-03-02 NOTE — Telephone Encounter (Signed)
 Patient returned RN's call.

## 2024-03-02 NOTE — Telephone Encounter (Signed)
 Attempted to call pt to discuss his questions about a sleep study. LMTCB.

## 2024-03-05 NOTE — Telephone Encounter (Signed)
 Ordering provider: Arnoldo Lapping Associated diagnoses: I44.0 WatchPAT PA obtained on 03/05/2024 by Gaylene Kays, CMA. Authorization: READY- Approved-Outpatient Authorization #[id number] Dates: 03/05/2024 - 06/03/2024  Patient notified of PIN (1234) on 03/05/2024 via Notification Method: phone.

## 2024-03-05 NOTE — Telephone Encounter (Signed)
 Patient was calling in to get help with the sleep study. Please advise

## 2024-03-05 NOTE — Telephone Encounter (Signed)
 Returned patients call and he will come tomorrow to get his device and information.

## 2024-03-06 NOTE — Telephone Encounter (Signed)
 Patient agreement reviewed and signed on 03/06/2024.  WatchPAT issued to patient on 03/06/2024 by Maceo Sax, LPN. Patient aware to not open the WatchPAT box until contacted with the activation PIN. Patient profile initialized in CloudPAT on 03/06/2024 by Enedina Harrow, LPN. Device serial number: [id number]  Please list Reason for Call as Advice Only and type "WatchPAT issued to patient" in the comment box.

## 2024-03-07 ENCOUNTER — Encounter (INDEPENDENT_AMBULATORY_CARE_PROVIDER_SITE_OTHER): Payer: Self-pay | Admitting: Cardiology

## 2024-03-07 DIAGNOSIS — R0683 Snoring: Secondary | ICD-10-CM | POA: Diagnosis not present

## 2024-03-07 DIAGNOSIS — I459 Conduction disorder, unspecified: Secondary | ICD-10-CM | POA: Diagnosis not present

## 2024-03-09 ENCOUNTER — Ambulatory Visit: Attending: Cardiovascular Disease

## 2024-03-09 DIAGNOSIS — I440 Atrioventricular block, first degree: Secondary | ICD-10-CM

## 2024-03-09 NOTE — Procedures (Signed)
   SLEEP STUDY REPORT Patient Information Study Date: 03/07/2024 Patient Name: [name] Patient ID: [id number] Birth Date: December 23, 1940 Age: 83 Gender: Male BMI: 33.4 (W=247 lb, H=6' 0'') Referring Physician: Arnoldo Lapping, MD  TEST DESCRIPTION: Home sleep apnea testing was completed using the WatchPat, a Type 1 device, utilizing peripheral arterial tonometry (PAT), chest movement, actigraphy, pulse oximetry, pulse rate, body position and snore. AHI was calculated with apnea and hypopnea using valid sleep time as the denominator. RDI includes apneas, hypopneas, and RERAs. The data acquired and the scoring of sleep and all associated events were performed in accordance with the recommended standards and specifications as outlined in the AASM Manual for the Scoring of  Sleep and Associated Events 2.2.0 (2015). FINDINGS: 1. No evidence of Obstructive Sleep Apnea with AHI 1/hr. 2. No Central Sleep Apnea. 3. Oxygen desaturations as low as 87%. 4. Mild snoring was present. O2 sats were < 88% for 0 minutes. 5. Total sleep time was 7 hrs and 36 min. 6. 24.1% of total sleep time was spent in REM sleep. 7. Normal sleep onset latency at 22 min. 8. ShortenedREM sleep onset latency at 47 min. 9. Total awakenings were 7.  DIAGNOSIS: Normal study with no significant sleep disordered breathing.  RECOMMENDATIONS: 1. Normal study with no significant sleep disordered breathing. 2. Healthy sleep recommendations include: adequate nightly sleep (normal 7-9 hrs/night), avoidance of caffeine after noon and alcohol near bedtime, and maintaining a sleep environment that is cool, dark and quiet. 3. Weight loss for overweight patients is recommended. 4. Snoring recommendations include: weight loss where appropriate, side sleeping, and avoidance of alcohol before bed. 5. Operation of motor vehicle or dangerous equipment must be avoided when feeling drowsy, excessively sleepy, or mentally fatigued. 6. An  ENT consultation which may be useful for specific causes of and possible treatment of bothersome snoring . 7. Weight loss may be of benefit in reducing the severity of snoring.   Signature: Gaylyn Keas, MD; [facility]; Diplomat, American Board of Sleep Medicine Electronically Signed: 03/09/2024 8:37:48 PM

## 2024-03-10 DIAGNOSIS — M5440 Lumbago with sciatica, unspecified side: Secondary | ICD-10-CM | POA: Diagnosis not present

## 2024-03-10 DIAGNOSIS — R2681 Unsteadiness on feet: Secondary | ICD-10-CM | POA: Diagnosis not present

## 2024-03-11 ENCOUNTER — Telehealth: Payer: Self-pay | Admitting: *Deleted

## 2024-03-11 NOTE — Telephone Encounter (Signed)
-----   Message from Gaylyn Keas sent at 03/09/2024  8:38 PM EDT ----- Please let patient know that sleep study showed no significant sleep apnea.

## 2024-03-11 NOTE — Telephone Encounter (Signed)
 The patient has been notified of the result and verbalized understanding.  All questions (if any) were answered. Gaylene Kays, CMA 03/11/2024 5:12 PM    Pt is to agreeable to normal results.

## 2024-03-12 DIAGNOSIS — M5440 Lumbago with sciatica, unspecified side: Secondary | ICD-10-CM | POA: Diagnosis not present

## 2024-03-12 DIAGNOSIS — R2681 Unsteadiness on feet: Secondary | ICD-10-CM | POA: Diagnosis not present

## 2024-03-16 DIAGNOSIS — R2681 Unsteadiness on feet: Secondary | ICD-10-CM | POA: Diagnosis not present

## 2024-03-16 DIAGNOSIS — M5440 Lumbago with sciatica, unspecified side: Secondary | ICD-10-CM | POA: Diagnosis not present

## 2024-03-18 DIAGNOSIS — M5440 Lumbago with sciatica, unspecified side: Secondary | ICD-10-CM | POA: Diagnosis not present

## 2024-03-18 DIAGNOSIS — R2681 Unsteadiness on feet: Secondary | ICD-10-CM | POA: Diagnosis not present

## 2024-03-19 ENCOUNTER — Other Ambulatory Visit: Payer: Self-pay | Admitting: Internal Medicine

## 2024-03-21 ENCOUNTER — Other Ambulatory Visit: Payer: Self-pay | Admitting: Cardiovascular Disease

## 2024-03-23 DIAGNOSIS — R2681 Unsteadiness on feet: Secondary | ICD-10-CM | POA: Diagnosis not present

## 2024-03-23 DIAGNOSIS — M5440 Lumbago with sciatica, unspecified side: Secondary | ICD-10-CM | POA: Diagnosis not present

## 2024-03-25 DIAGNOSIS — R2681 Unsteadiness on feet: Secondary | ICD-10-CM | POA: Diagnosis not present

## 2024-03-25 DIAGNOSIS — M5440 Lumbago with sciatica, unspecified side: Secondary | ICD-10-CM | POA: Diagnosis not present

## 2024-03-30 DIAGNOSIS — M5440 Lumbago with sciatica, unspecified side: Secondary | ICD-10-CM | POA: Diagnosis not present

## 2024-03-30 DIAGNOSIS — R2681 Unsteadiness on feet: Secondary | ICD-10-CM | POA: Diagnosis not present

## 2024-04-01 DIAGNOSIS — M5440 Lumbago with sciatica, unspecified side: Secondary | ICD-10-CM | POA: Diagnosis not present

## 2024-04-01 DIAGNOSIS — R2681 Unsteadiness on feet: Secondary | ICD-10-CM | POA: Diagnosis not present

## 2024-04-05 ENCOUNTER — Other Ambulatory Visit: Payer: Self-pay | Admitting: Physician Assistant

## 2024-04-06 DIAGNOSIS — M5440 Lumbago with sciatica, unspecified side: Secondary | ICD-10-CM | POA: Diagnosis not present

## 2024-04-06 DIAGNOSIS — R2681 Unsteadiness on feet: Secondary | ICD-10-CM | POA: Diagnosis not present

## 2024-04-08 ENCOUNTER — Other Ambulatory Visit: Payer: Self-pay

## 2024-04-08 DIAGNOSIS — M5440 Lumbago with sciatica, unspecified side: Secondary | ICD-10-CM | POA: Diagnosis not present

## 2024-04-08 DIAGNOSIS — R2681 Unsteadiness on feet: Secondary | ICD-10-CM | POA: Diagnosis not present

## 2024-04-08 MED ORDER — AMLODIPINE BESYLATE 2.5 MG PO TABS: 2.5000 mg | ORAL_TABLET | Freq: Every day | ORAL | 3 refills | Status: AC

## 2024-04-13 DIAGNOSIS — M5440 Lumbago with sciatica, unspecified side: Secondary | ICD-10-CM | POA: Diagnosis not present

## 2024-04-13 DIAGNOSIS — R2681 Unsteadiness on feet: Secondary | ICD-10-CM | POA: Diagnosis not present

## 2024-04-15 DIAGNOSIS — M5440 Lumbago with sciatica, unspecified side: Secondary | ICD-10-CM | POA: Diagnosis not present

## 2024-04-15 DIAGNOSIS — R2681 Unsteadiness on feet: Secondary | ICD-10-CM | POA: Diagnosis not present

## 2024-04-20 DIAGNOSIS — M5440 Lumbago with sciatica, unspecified side: Secondary | ICD-10-CM | POA: Diagnosis not present

## 2024-04-20 DIAGNOSIS — R2681 Unsteadiness on feet: Secondary | ICD-10-CM | POA: Diagnosis not present

## 2024-04-21 ENCOUNTER — Inpatient Hospital Stay: Payer: PPO | Attending: Oncology

## 2024-04-21 ENCOUNTER — Inpatient Hospital Stay: Payer: PPO | Admitting: Oncology

## 2024-04-21 VITALS — BP 126/86 | HR 85 | Temp 97.7°F | Resp 18 | Ht 72.0 in | Wt 242.7 lb

## 2024-04-21 DIAGNOSIS — D751 Secondary polycythemia: Secondary | ICD-10-CM

## 2024-04-21 DIAGNOSIS — D582 Other hemoglobinopathies: Secondary | ICD-10-CM

## 2024-04-21 LAB — CBC WITH DIFFERENTIAL (CANCER CENTER ONLY)
Abs Immature Granulocytes: 0.07 K/uL (ref 0.00–0.07)
Basophils Absolute: 0 K/uL (ref 0.0–0.1)
Basophils Relative: 1 %
Eosinophils Absolute: 0.2 K/uL (ref 0.0–0.5)
Eosinophils Relative: 3 %
HCT: 54 % — ABNORMAL HIGH (ref 39.0–52.0)
Hemoglobin: 18.4 g/dL — ABNORMAL HIGH (ref 13.0–17.0)
Immature Granulocytes: 1 %
Lymphocytes Relative: 19 %
Lymphs Abs: 1.5 K/uL (ref 0.7–4.0)
MCH: 32.1 pg (ref 26.0–34.0)
MCHC: 34.1 g/dL (ref 30.0–36.0)
MCV: 94.2 fL (ref 80.0–100.0)
Monocytes Absolute: 0.7 K/uL (ref 0.1–1.0)
Monocytes Relative: 9 %
Neutro Abs: 5.3 K/uL (ref 1.7–7.7)
Neutrophils Relative %: 67 %
Platelet Count: 168 K/uL (ref 150–400)
RBC: 5.73 MIL/uL (ref 4.22–5.81)
RDW: 12.8 % (ref 11.5–15.5)
WBC Count: 7.8 K/uL (ref 4.0–10.5)
nRBC: 0 % (ref 0.0–0.2)

## 2024-04-21 NOTE — Progress Notes (Signed)
  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  He feels well.  No bleeding or symptom of thrombosis.  He is not drinking alcohol.  He exercises.  He reports balance difficulty has improved with physical therapy.  He had a negative sleep apnea study in May.  Objective:  Vital signs in last 24 hours:  Blood pressure 126/86, pulse 85, temperature 97.7 F (36.5 C), temperature source Temporal, resp. rate 18, height 6' (1.829 m), weight 242 lb 11.2 oz (110.1 kg), SpO2 98%.   Resp: Lungs clear bilaterally Cardio: Regular rhythm with premature beats GI: No hepatosplenomegaly Vascular: No leg edema   Lab Results:  Lab Results  Component Value Date   WBC 7.7 01/13/2024   HGB 18.1 Repeated and verified X2. (HH) 01/13/2024   HCT 53.0 (H) 01/13/2024   MCV 95.3 01/13/2024   PLT 161.0 01/13/2024   NEUTROABS 5.5 01/13/2024    CMP  Lab Results  Component Value Date   NA 140 01/13/2024   K 4.3 01/13/2024   CL 103 01/13/2024   CO2 26 01/13/2024   GLUCOSE 153 (H) 01/13/2024   BUN 21 01/13/2024   CREATININE 1.16 01/13/2024   CALCIUM  9.7 01/13/2024   PROT 7.1 01/13/2024   ALBUMIN 4.7 01/13/2024   AST 16 01/13/2024   ALT 21 01/13/2024   ALKPHOS 50 01/13/2024   BILITOT 1.0 01/13/2024   GFRNONAA 72 06/27/2020   GFRAA 83 06/27/2020    Medications: I have reviewed the patient's current medications.   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  The etiology of the erythrocytosis is unclear.  Erythrocytosis has been present for several years and has not changed significantly.  I have a low clinical suspicion for a myeloproliferative disorder.  I do not recommend phlebotomy therapy.  He would like to continue  follow-up in the hematology clinic.  He will return for an office visit and CBC in 6 months.  Mathew Hof, MD  04/21/2024  11:39 AM

## 2024-04-22 DIAGNOSIS — M5440 Lumbago with sciatica, unspecified side: Secondary | ICD-10-CM | POA: Diagnosis not present

## 2024-04-22 DIAGNOSIS — R2681 Unsteadiness on feet: Secondary | ICD-10-CM | POA: Diagnosis not present

## 2024-04-23 LAB — ERYTHROPOIETIN: Erythropoietin: 12.3 m[IU]/mL (ref 2.6–18.5)

## 2024-04-27 DIAGNOSIS — M5440 Lumbago with sciatica, unspecified side: Secondary | ICD-10-CM | POA: Diagnosis not present

## 2024-04-27 DIAGNOSIS — R2681 Unsteadiness on feet: Secondary | ICD-10-CM | POA: Diagnosis not present

## 2024-04-29 DIAGNOSIS — R2681 Unsteadiness on feet: Secondary | ICD-10-CM | POA: Diagnosis not present

## 2024-04-29 DIAGNOSIS — M5440 Lumbago with sciatica, unspecified side: Secondary | ICD-10-CM | POA: Diagnosis not present

## 2024-05-04 DIAGNOSIS — M5440 Lumbago with sciatica, unspecified side: Secondary | ICD-10-CM | POA: Diagnosis not present

## 2024-05-04 DIAGNOSIS — R2681 Unsteadiness on feet: Secondary | ICD-10-CM | POA: Diagnosis not present

## 2024-05-06 DIAGNOSIS — R2681 Unsteadiness on feet: Secondary | ICD-10-CM | POA: Diagnosis not present

## 2024-05-06 DIAGNOSIS — M5440 Lumbago with sciatica, unspecified side: Secondary | ICD-10-CM | POA: Diagnosis not present

## 2024-05-11 DIAGNOSIS — R2681 Unsteadiness on feet: Secondary | ICD-10-CM | POA: Diagnosis not present

## 2024-05-11 DIAGNOSIS — M5440 Lumbago with sciatica, unspecified side: Secondary | ICD-10-CM | POA: Diagnosis not present

## 2024-05-12 DIAGNOSIS — M5116 Intervertebral disc disorders with radiculopathy, lumbar region: Secondary | ICD-10-CM | POA: Diagnosis not present

## 2024-05-12 DIAGNOSIS — M48061 Spinal stenosis, lumbar region without neurogenic claudication: Secondary | ICD-10-CM | POA: Diagnosis not present

## 2024-05-12 DIAGNOSIS — M4319 Spondylolisthesis, multiple sites in spine: Secondary | ICD-10-CM | POA: Diagnosis not present

## 2024-05-12 DIAGNOSIS — M5440 Lumbago with sciatica, unspecified side: Secondary | ICD-10-CM | POA: Diagnosis not present

## 2024-05-12 DIAGNOSIS — M47816 Spondylosis without myelopathy or radiculopathy, lumbar region: Secondary | ICD-10-CM | POA: Diagnosis not present

## 2024-05-13 DIAGNOSIS — M5440 Lumbago with sciatica, unspecified side: Secondary | ICD-10-CM | POA: Diagnosis not present

## 2024-05-13 DIAGNOSIS — R2681 Unsteadiness on feet: Secondary | ICD-10-CM | POA: Diagnosis not present

## 2024-05-19 DIAGNOSIS — R2681 Unsteadiness on feet: Secondary | ICD-10-CM | POA: Diagnosis not present

## 2024-05-19 DIAGNOSIS — M5440 Lumbago with sciatica, unspecified side: Secondary | ICD-10-CM | POA: Diagnosis not present

## 2024-05-21 DIAGNOSIS — M5440 Lumbago with sciatica, unspecified side: Secondary | ICD-10-CM | POA: Diagnosis not present

## 2024-05-21 DIAGNOSIS — R2681 Unsteadiness on feet: Secondary | ICD-10-CM | POA: Diagnosis not present

## 2024-06-04 DIAGNOSIS — M5440 Lumbago with sciatica, unspecified side: Secondary | ICD-10-CM | POA: Diagnosis not present

## 2024-06-04 DIAGNOSIS — R2681 Unsteadiness on feet: Secondary | ICD-10-CM | POA: Diagnosis not present

## 2024-06-09 DIAGNOSIS — M5440 Lumbago with sciatica, unspecified side: Secondary | ICD-10-CM | POA: Diagnosis not present

## 2024-06-09 DIAGNOSIS — R2681 Unsteadiness on feet: Secondary | ICD-10-CM | POA: Diagnosis not present

## 2024-06-11 DIAGNOSIS — M5440 Lumbago with sciatica, unspecified side: Secondary | ICD-10-CM | POA: Diagnosis not present

## 2024-06-11 DIAGNOSIS — R2681 Unsteadiness on feet: Secondary | ICD-10-CM | POA: Diagnosis not present

## 2024-06-12 DIAGNOSIS — R351 Nocturia: Secondary | ICD-10-CM | POA: Diagnosis not present

## 2024-06-12 DIAGNOSIS — N401 Enlarged prostate with lower urinary tract symptoms: Secondary | ICD-10-CM | POA: Diagnosis not present

## 2024-06-16 ENCOUNTER — Other Ambulatory Visit: Payer: Self-pay | Admitting: Internal Medicine

## 2024-06-16 DIAGNOSIS — E119 Type 2 diabetes mellitus without complications: Secondary | ICD-10-CM | POA: Diagnosis not present

## 2024-06-16 DIAGNOSIS — H53143 Visual discomfort, bilateral: Secondary | ICD-10-CM | POA: Diagnosis not present

## 2024-06-16 LAB — HM DIABETES EYE EXAM

## 2024-06-18 DIAGNOSIS — Z6833 Body mass index (BMI) 33.0-33.9, adult: Secondary | ICD-10-CM | POA: Diagnosis not present

## 2024-06-18 DIAGNOSIS — M5440 Lumbago with sciatica, unspecified side: Secondary | ICD-10-CM | POA: Diagnosis not present

## 2024-06-23 DIAGNOSIS — M5440 Lumbago with sciatica, unspecified side: Secondary | ICD-10-CM | POA: Diagnosis not present

## 2024-06-23 DIAGNOSIS — R2681 Unsteadiness on feet: Secondary | ICD-10-CM | POA: Diagnosis not present

## 2024-07-03 ENCOUNTER — Telehealth: Payer: Self-pay | Admitting: Pharmacist

## 2024-07-03 DIAGNOSIS — I10 Essential (primary) hypertension: Secondary | ICD-10-CM

## 2024-07-03 MED ORDER — LOSARTAN POTASSIUM 100 MG PO TABS: ORAL_TABLET | ORAL | 1 refills | Status: AC

## 2024-07-03 NOTE — Progress Notes (Signed)
 Pharmacy Quality Measure Review  This patient is appearing on a report for being at risk of failing the adherence measure for hypertension (ACEi/ARB) medications this calendar year.   Medication: losartan  100 mg Last fill date: 04/21/24 for 90 day supply  Pt notes he does take losartan  daily. Has about 10 tablets remaining. Refill is due after adherence date due to last refill being almost 3 months late. Pt will fail measure this year. Will send refill for him to get since he is out in about 10 days.  Darrelyn Drum, PharmD, BCPS, CPP Clinical Pharmacist Practitioner [facility] [phone]

## 2024-07-12 ENCOUNTER — Encounter: Payer: Self-pay | Admitting: Internal Medicine

## 2024-07-12 DIAGNOSIS — E119 Type 2 diabetes mellitus without complications: Secondary | ICD-10-CM | POA: Insufficient documentation

## 2024-07-12 NOTE — Progress Notes (Unsigned)
 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   MRN: [id number]     HPI [name] is here for follow up of his chronic medical problems.  Did PT for the vertigo - it helped a lot.   He still has periods of off balance - no true spinning.    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    Walking, bicycle, golf  Medications and allergies reviewed with patient and updated if appropriate.  Current Outpatient Medications on File Prior to Visit  Medication Sig Dispense Refill   acetaminophen (TYLENOL 8 HOUR) 650 MG CR tablet Take 650 mg by mouth every 8 (eight) hours as needed for pain.     amLODipine  (NORVASC ) 2.5 MG tablet Take 1 tablet (2.5 mg total) by mouth daily. 90 tablet 3   aspirin  EC 81 MG tablet Take 1 tablet (81 mg total) by mouth daily. Swallow whole. 90 tablet 3   co-enzyme Q-10 30 MG capsule Take 30 mg by mouth daily.     ipratropium (ATROVENT ) 0.06 % nasal spray Place 2 sprays into both nostrils 4 (four) times daily. (Patient taking differently: Place 2 sprays into both nostrils as needed.) 15 mL 12   JARDIANCE  25 MG TABS tablet TAKE 1 TABLET(25 MG) BY MOUTH DAILY BEFORE BREAKFAST 90 tablet 1   losartan  (COZAAR ) 100 MG tablet TAKE 1 TABLET(100 MG) BY MOUTH DAILY 90 tablet 1   metFORMIN  (GLUCOPHAGE ) 1000 MG tablet TAKE 1 TABLET(1000 MG) BY MOUTH TWICE DAILY WITH A MEAL 180 tablet 1   metoprolol  tartrate (LOPRESSOR ) 25 MG tablet TAKE 1 TABLET(25 MG) BY MOUTH TWICE DAILY 180 tablet 1   rosuvastatin  (CRESTOR ) 40 MG tablet TAKE 1 TABLET(40 MG) BY MOUTH DAILY 90 tablet 3   tamsulosin  (FLOMAX ) 0.4 MG CAPS capsule Take 1 capsule (0.4 mg total) by mouth daily. 90 capsule 1   Zinc 25 MG TABS Take 25 mg by mouth daily.     No current facility-administered medications on file prior to visit.     Review of Systems  Constitutional:  Negative for fever.  Respiratory:  Negative for cough, shortness of breath and wheezing.    Cardiovascular:  Negative for chest pain, palpitations and leg swelling.  Neurological:  Negative for dizziness, light-headedness and headaches.       Objective:   Vitals:   07/15/24 0753  BP: 124/60  Pulse: (!) 54  Temp: 98.3 F (36.8 C)  SpO2: 97%   BP Readings from Last 3 Encounters:  07/15/24 124/60  04/21/24 126/86  02/17/24 126/70   Wt Readings from Last 3 Encounters:  07/15/24 247 lb (112 kg)  04/21/24 242 lb 11.2 oz (110.1 kg)  02/17/24 246 lb (111.6 kg)   Body mass index is 33.5 kg/m.    Physical Exam Constitutional:      General: He is not in acute distress.    Appearance: Normal appearance. He is not ill-appearing.  HENT:     Head: Normocephalic and atraumatic.  Eyes:     Conjunctiva/sclera: Conjunctivae normal.  Cardiovascular:     Rate and Rhythm: Normal rate and regular rhythm.     Heart sounds: Normal heart sounds.  Pulmonary:     Effort: Pulmonary effort is normal. No respiratory distress.     Breath sounds: Normal breath sounds. No wheezing or rales.  Musculoskeletal:     Right lower leg:  No edema.     Left lower leg: No edema.  Skin:    General: Skin is warm and dry.     Findings: No rash.  Neurological:     Mental Status: He is alert. Mental status is at baseline.  Psychiatric:        Mood and Affect: Mood normal.       Diabetic Foot Exam - Simple   Simple Foot Form Diabetic Foot exam was performed with the following findings: Yes 07/15/2024  8:22 AM  Visual Inspection No deformities, no ulcerations, no other skin breakdown bilaterally: Yes Sensation Testing Intact to touch and monofilament testing bilaterally: Yes Pulse Check Posterior Tibialis and Dorsalis pulse intact bilaterally: Yes Comments      Lab Results  Component Value Date   WBC 7.8 04/21/2024   HGB 18.4 (H) 04/21/2024   HCT 54.0 (H) 04/21/2024   PLT 168 04/21/2024   GLUCOSE 153 (H) 01/13/2024   CHOL 102 01/13/2024   TRIG 118.0 01/13/2024   HDL 36.40 (L)  01/13/2024   LDLCALC 42 01/13/2024   ALT 21 01/13/2024   AST 16 01/13/2024   NA 140 01/13/2024   K 4.3 01/13/2024   CL 103 01/13/2024   CREATININE 1.16 01/13/2024   BUN 21 01/13/2024   CO2 26 01/13/2024   TSH 2.30 01/13/2024   PSA 0.90 07/08/2023   INR 1.0 05/03/2008   HGBA1C 7.1 (H) 01/13/2024   MICROALBUR 1.2 01/13/2024     Assessment & Plan:    See Problem List for Assessment and Plan of chronic medical problems.   Had flu at [facility] [address]

## 2024-07-12 NOTE — Patient Instructions (Addendum)
      Blood work was ordered.       Medications changes include :   start rybelsus 3 mg daily     Return in about 6 months (around 01/13/2025) for Physical Exam.

## 2024-07-15 ENCOUNTER — Telehealth: Payer: Self-pay

## 2024-07-15 ENCOUNTER — Ambulatory Visit: Admitting: Internal Medicine

## 2024-07-15 ENCOUNTER — Other Ambulatory Visit (HOSPITAL_COMMUNITY): Payer: Self-pay

## 2024-07-15 VITALS — BP 124/60 | HR 54 | Temp 98.3°F | Ht 72.0 in | Wt 247.0 lb

## 2024-07-15 DIAGNOSIS — I2510 Atherosclerotic heart disease of native coronary artery without angina pectoris: Secondary | ICD-10-CM | POA: Diagnosis not present

## 2024-07-15 DIAGNOSIS — M545 Low back pain, unspecified: Secondary | ICD-10-CM

## 2024-07-15 DIAGNOSIS — I152 Hypertension secondary to endocrine disorders: Secondary | ICD-10-CM | POA: Diagnosis not present

## 2024-07-15 DIAGNOSIS — E1169 Type 2 diabetes mellitus with other specified complication: Secondary | ICD-10-CM

## 2024-07-15 DIAGNOSIS — E119 Type 2 diabetes mellitus without complications: Secondary | ICD-10-CM

## 2024-07-15 DIAGNOSIS — E785 Hyperlipidemia, unspecified: Secondary | ICD-10-CM | POA: Diagnosis not present

## 2024-07-15 DIAGNOSIS — N401 Enlarged prostate with lower urinary tract symptoms: Secondary | ICD-10-CM | POA: Diagnosis not present

## 2024-07-15 DIAGNOSIS — E1159 Type 2 diabetes mellitus with other circulatory complications: Secondary | ICD-10-CM | POA: Diagnosis not present

## 2024-07-15 DIAGNOSIS — E1122 Type 2 diabetes mellitus with diabetic chronic kidney disease: Secondary | ICD-10-CM

## 2024-07-15 DIAGNOSIS — R350 Frequency of micturition: Secondary | ICD-10-CM

## 2024-07-15 DIAGNOSIS — G8929 Other chronic pain: Secondary | ICD-10-CM

## 2024-07-15 DIAGNOSIS — N1831 Chronic kidney disease, stage 3a: Secondary | ICD-10-CM | POA: Diagnosis not present

## 2024-07-15 LAB — COMPREHENSIVE METABOLIC PANEL WITH GFR
ALT: 25 U/L (ref 0–53)
AST: 23 U/L (ref 0–37)
Albumin: 4.5 g/dL (ref 3.5–5.2)
Alkaline Phosphatase: 47 U/L (ref 39–117)
BUN: 17 mg/dL (ref 6–23)
CO2: 22 meq/L (ref 19–32)
Calcium: 9.9 mg/dL (ref 8.4–10.5)
Chloride: 104 meq/L (ref 96–112)
Creatinine, Ser: 1.13 mg/dL (ref 0.40–1.50)
GFR: 61.23 mL/min (ref >60.00)
Glucose, Bld: 174 mg/dL — ABNORMAL HIGH (ref 70–99)
Potassium: 4.3 meq/L (ref 3.5–5.1)
Sodium: 138 meq/L (ref 135–145)
Total Bilirubin: 0.9 mg/dL (ref 0.2–1.2)
Total Protein: 6.9 g/dL (ref 6.0–8.3)

## 2024-07-15 LAB — LIPID PANEL
Cholesterol: 100 mg/dL (ref 0–200)
HDL: 36.8 mg/dL — ABNORMAL LOW (ref >39.00)
LDL Cholesterol: 37 mg/dL (ref 0–99)
NonHDL: 62.71
Total CHOL/HDL Ratio: 3
Triglycerides: 129 mg/dL (ref 0.0–149.0)
VLDL: 25.8 mg/dL (ref 0.0–40.0)

## 2024-07-15 LAB — HEMOGLOBIN A1C: Hgb A1c MFr Bld: 6.9 % — ABNORMAL HIGH (ref 4.6–6.5)

## 2024-07-15 MED ORDER — RYBELSUS 3 MG PO TABS: 3.0000 mg | ORAL_TABLET | Freq: Every day | ORAL | 0 refills | Status: AC

## 2024-07-15 NOTE — Assessment & Plan Note (Addendum)
 Chronic Associated with CKD stage 3a, CAD, hyperlipidemia  Lab Results  Component Value Date   HGBA1C 7.1 (H) 01/13/2024   Sugars not ideally controlled Check A1c Start rybelsus 3 mg daily Continue jardiance  25 mg daily, metformin  1000 mg bid Stressed regular exercise, diabetic diet

## 2024-07-15 NOTE — Telephone Encounter (Signed)
 Pharmacy Patient Advocate Encounter   Received notification from CoverMyMeds that prior authorization for Rybelsus 3mg  tabs is required/requested.   Insurance verification completed.   The patient is insured through [facility] ADVANTAGE/RX ADVANCE.   Per test claim: PA required; PA submitted to above mentioned insurance via Latent Key/confirmation #/EOC [id number] Status is pending

## 2024-07-15 NOTE — Assessment & Plan Note (Signed)
 Chronic No back pain now -- only has pain when he overdoes it Prednisone  helped relive the pain Saw Dr Onetha Had an MRI and it is osteoarthritis

## 2024-07-15 NOTE — Assessment & Plan Note (Signed)
 Chronic Blood pressure well controlled CMP Continue amlodipine  2.5 mg daily losartan  100 mg daily, metoprolol  25 mg bid, hydrochlorothiazide  25 mg daily

## 2024-07-15 NOTE — Assessment & Plan Note (Signed)
 Chronic Check lipid panel, CMP Continue Crestor  40 mg daily Regular exercise and healthy diet encouraged

## 2024-07-15 NOTE — Assessment & Plan Note (Signed)
Chronic Following with cardiology History of stent No symptoms consistent with angina Rosuvastatin 40 mg daily, ASA 81 mg, metoprolol 25 mg twice daily, Jardiance 25 mg daily

## 2024-07-15 NOTE — Assessment & Plan Note (Signed)
 Chronic Stable, mild On jaridance 25 mg daily, losartan  10 mg daily discussed importance of keeping sugar, BP well-controlled Healthy diet, regular exercise, weight loss advised Avoid NSAIDs Increase water intake CMP

## 2024-07-15 NOTE — Telephone Encounter (Signed)
 Pharmacy Patient Advocate Encounter  Received notification from [facility] ADVANTAGE/RX ADVANCE that Prior Authorization for Rybelsus 3mg  tabs has been APPROVED from 07/15/2024 to 07/15/2025. Ran test claim, Copay is $47.00. This test claim was processed through [facility], [place]- copay amounts may vary at other pharmacies due to pharmacy/plan contracts, or as the patient moves through the different stages of their insurance plan.   PA #/Case ID/Reference #: [id number]

## 2024-07-15 NOTE — Assessment & Plan Note (Addendum)
 Chronic Will stop seeing urology  Continue tamsulosin  0.4 mg daily I will do prostate exam annually

## 2024-07-16 ENCOUNTER — Ambulatory Visit: Payer: Self-pay | Admitting: Internal Medicine

## 2024-08-06 ENCOUNTER — Telehealth: Payer: Self-pay | Admitting: Oncology

## 2024-08-06 NOTE — Telephone Encounter (Signed)
 PT called to confirm appts

## 2024-08-07 ENCOUNTER — Telehealth: Payer: Self-pay | Admitting: Pharmacist

## 2024-08-07 NOTE — Progress Notes (Signed)
 Pharmacy Quality Measure Review  This patient is appearing on a report for being at risk of failing the adherence measure for cholesterol (statin) medications this calendar year.   Medication: Rosuvastatin  Last fill date: 03/23/24 for 90 day supply  Left voicemail for patient to return my call at their convenience. and MyChart message sent to patient.  Darrelyn Drum, PharmD, BCPS, CPP Clinical Pharmacist Practitioner [facility] [phone]

## 2024-09-25 ENCOUNTER — Other Ambulatory Visit: Payer: Self-pay | Admitting: Internal Medicine

## 2024-10-07 ENCOUNTER — Telehealth: Payer: Self-pay | Admitting: Oncology

## 2024-10-07 NOTE — Telephone Encounter (Signed)
 Spoke with PT about rescheduling appt, day and time confirmed.

## 2024-10-22 ENCOUNTER — Ambulatory Visit: Admitting: Oncology

## 2024-10-22 ENCOUNTER — Other Ambulatory Visit

## 2024-10-27 ENCOUNTER — Other Ambulatory Visit (HOSPITAL_COMMUNITY): Payer: Self-pay

## 2024-10-28 ENCOUNTER — Other Ambulatory Visit (HOSPITAL_COMMUNITY): Payer: Self-pay

## 2024-10-28 MED FILL — Rosuvastatin Calcium Tab 40 MG: ORAL | 90 days supply | Qty: 90 | Fill #0 | Status: AC

## 2024-10-29 ENCOUNTER — Other Ambulatory Visit (HOSPITAL_COMMUNITY): Payer: Self-pay

## 2024-11-23 ENCOUNTER — Inpatient Hospital Stay: Admitting: Oncology

## 2024-11-23 ENCOUNTER — Inpatient Hospital Stay

## 2025-01-18 ENCOUNTER — Encounter: Admitting: Internal Medicine

## 2025-02-05 ENCOUNTER — Ambulatory Visit
